# Patient Record
Sex: Male | Born: 1950
Health system: Southern US, Community
[De-identification: ages and names within clinical notes are randomized; demographics above are authoritative.]

## PROBLEM LIST (undated history)

## (undated) DIAGNOSIS — G4730 Sleep apnea, unspecified: Secondary | ICD-10-CM

## (undated) DIAGNOSIS — M545 Low back pain, unspecified: Secondary | ICD-10-CM

## (undated) DIAGNOSIS — I10 Essential (primary) hypertension: Secondary | ICD-10-CM

## (undated) DIAGNOSIS — E119 Type 2 diabetes mellitus without complications: Secondary | ICD-10-CM

## (undated) DIAGNOSIS — M810 Age-related osteoporosis without current pathological fracture: Secondary | ICD-10-CM

## (undated) DIAGNOSIS — M1990 Unspecified osteoarthritis, unspecified site: Secondary | ICD-10-CM

## (undated) DIAGNOSIS — I219 Acute myocardial infarction, unspecified: Secondary | ICD-10-CM

## (undated) DIAGNOSIS — I429 Cardiomyopathy, unspecified: Secondary | ICD-10-CM

## (undated) DIAGNOSIS — E7800 Pure hypercholesterolemia, unspecified: Secondary | ICD-10-CM

## (undated) HISTORY — DX: Low back pain: M54.5

## (undated) HISTORY — DX: Cardiomyopathy, unspecified: I42.9

## (undated) HISTORY — PX: BACK SURGERY: SHX140

## (undated) HISTORY — PX: APPENDECTOMY: SHX54

## (undated) HISTORY — DX: Pure hypercholesterolemia, unspecified: E78.00

## (undated) HISTORY — DX: Acute myocardial infarction, unspecified: I21.9

## (undated) HISTORY — PX: HAND SURGERY: SHX662

## (undated) HISTORY — DX: Sleep apnea, unspecified: G47.30

## (undated) HISTORY — DX: Low back pain, unspecified: M54.50

## (undated) HISTORY — DX: Age-related osteoporosis without current pathological fracture: M81.0

---

## 1998-02-19 ENCOUNTER — Emergency Department (HOSPITAL_COMMUNITY): Admission: EM | Admit: 1998-02-19 | Discharge: 1998-02-19 | Payer: Self-pay | Admitting: Emergency Medicine

## 1998-07-28 ENCOUNTER — Emergency Department (HOSPITAL_COMMUNITY): Admission: EM | Admit: 1998-07-28 | Discharge: 1998-07-28 | Payer: Self-pay

## 1999-06-27 ENCOUNTER — Emergency Department (HOSPITAL_COMMUNITY): Admission: EM | Admit: 1999-06-27 | Discharge: 1999-06-28 | Payer: Self-pay | Admitting: *Deleted

## 1999-06-28 ENCOUNTER — Emergency Department (HOSPITAL_COMMUNITY): Admission: EM | Admit: 1999-06-28 | Discharge: 1999-06-28 | Payer: Self-pay | Admitting: Emergency Medicine

## 1999-06-28 ENCOUNTER — Encounter: Payer: Self-pay | Admitting: Emergency Medicine

## 1999-08-02 ENCOUNTER — Inpatient Hospital Stay (HOSPITAL_COMMUNITY): Admission: EM | Admit: 1999-08-02 | Discharge: 1999-08-05 | Payer: Self-pay

## 2001-08-23 ENCOUNTER — Emergency Department (HOSPITAL_COMMUNITY): Admission: EM | Admit: 2001-08-23 | Discharge: 2001-08-23 | Payer: Self-pay | Admitting: Emergency Medicine

## 2001-08-29 ENCOUNTER — Ambulatory Visit (HOSPITAL_COMMUNITY): Admission: RE | Admit: 2001-08-29 | Discharge: 2001-08-29 | Payer: Self-pay | Admitting: Family Medicine

## 2001-08-29 ENCOUNTER — Encounter: Payer: Self-pay | Admitting: Family Medicine

## 2002-11-30 ENCOUNTER — Encounter: Payer: Self-pay | Admitting: Emergency Medicine

## 2002-11-30 ENCOUNTER — Emergency Department (HOSPITAL_COMMUNITY): Admission: EM | Admit: 2002-11-30 | Discharge: 2002-11-30 | Payer: Self-pay | Admitting: Emergency Medicine

## 2003-05-04 ENCOUNTER — Encounter: Admission: RE | Admit: 2003-05-04 | Discharge: 2003-05-04 | Payer: Self-pay | Admitting: Internal Medicine

## 2003-05-04 ENCOUNTER — Encounter: Payer: Self-pay | Admitting: Internal Medicine

## 2003-05-15 ENCOUNTER — Encounter: Admission: RE | Admit: 2003-05-15 | Discharge: 2003-05-24 | Payer: Self-pay | Admitting: Occupational Medicine

## 2003-10-02 ENCOUNTER — Inpatient Hospital Stay (HOSPITAL_COMMUNITY): Admission: RE | Admit: 2003-10-02 | Discharge: 2003-10-05 | Payer: Self-pay | Admitting: Orthopaedic Surgery

## 2003-10-02 ENCOUNTER — Encounter (INDEPENDENT_AMBULATORY_CARE_PROVIDER_SITE_OTHER): Payer: Self-pay | Admitting: Specialist

## 2004-02-28 ENCOUNTER — Encounter: Admission: RE | Admit: 2004-02-28 | Discharge: 2004-02-28 | Payer: Self-pay | Admitting: Orthopaedic Surgery

## 2004-07-04 ENCOUNTER — Emergency Department (HOSPITAL_COMMUNITY): Admission: EM | Admit: 2004-07-04 | Discharge: 2004-07-04 | Payer: Self-pay

## 2005-01-26 ENCOUNTER — Emergency Department (HOSPITAL_COMMUNITY): Admission: EM | Admit: 2005-01-26 | Discharge: 2005-01-26 | Payer: Self-pay | Admitting: Emergency Medicine

## 2005-12-03 ENCOUNTER — Emergency Department (HOSPITAL_COMMUNITY): Admission: EM | Admit: 2005-12-03 | Discharge: 2005-12-03 | Payer: Self-pay | Admitting: Emergency Medicine

## 2005-12-05 ENCOUNTER — Emergency Department (HOSPITAL_COMMUNITY): Admission: EM | Admit: 2005-12-05 | Discharge: 2005-12-05 | Payer: Self-pay | Admitting: Emergency Medicine

## 2006-12-03 ENCOUNTER — Emergency Department (HOSPITAL_COMMUNITY): Admission: EM | Admit: 2006-12-03 | Discharge: 2006-12-03 | Payer: Self-pay | Admitting: Emergency Medicine

## 2008-07-03 ENCOUNTER — Emergency Department (HOSPITAL_COMMUNITY): Admission: EM | Admit: 2008-07-03 | Discharge: 2008-07-03 | Payer: Self-pay | Admitting: Emergency Medicine

## 2008-08-17 ENCOUNTER — Emergency Department (HOSPITAL_COMMUNITY): Admission: EM | Admit: 2008-08-17 | Discharge: 2008-08-17 | Payer: Self-pay | Admitting: Emergency Medicine

## 2009-03-21 ENCOUNTER — Emergency Department (HOSPITAL_COMMUNITY): Admission: EM | Admit: 2009-03-21 | Discharge: 2009-03-22 | Payer: Self-pay | Admitting: Emergency Medicine

## 2009-08-30 ENCOUNTER — Emergency Department (HOSPITAL_COMMUNITY): Admission: EM | Admit: 2009-08-30 | Discharge: 2009-08-31 | Payer: Self-pay | Admitting: Emergency Medicine

## 2009-09-13 ENCOUNTER — Emergency Department (HOSPITAL_COMMUNITY): Admission: EM | Admit: 2009-09-13 | Discharge: 2009-09-13 | Payer: Self-pay | Admitting: Emergency Medicine

## 2010-06-21 ENCOUNTER — Emergency Department (HOSPITAL_COMMUNITY): Admission: EM | Admit: 2010-06-21 | Discharge: 2010-06-21 | Payer: Self-pay | Admitting: Emergency Medicine

## 2010-11-16 LAB — COMPREHENSIVE METABOLIC PANEL
ALT: 18 U/L (ref 0–53)
AST: 27 U/L (ref 0–37)
Albumin: 4 g/dL (ref 3.5–5.2)
Alkaline Phosphatase: 48 U/L (ref 39–117)
BUN: 10 mg/dL (ref 6–23)
CO2: 26 mEq/L (ref 19–32)
Calcium: 9.4 mg/dL (ref 8.4–10.5)
Chloride: 105 mEq/L (ref 96–112)
Creatinine, Ser: 1.21 mg/dL (ref 0.4–1.5)
GFR calc Af Amer: >60 mL/min (ref >60)
GFR calc non Af Amer: >60 mL/min (ref >60)
Glucose, Bld: 111 mg/dL — ABNORMAL HIGH (ref 70–99)
Potassium: 3.5 mEq/L (ref 3.5–5.1)
Sodium: 138 mEq/L (ref 135–145)
Total Bilirubin: 0.8 mg/dL (ref 0.3–1.2)
Total Protein: 7.4 g/dL (ref 6.0–8.3)

## 2010-11-16 LAB — URINALYSIS, ROUTINE W REFLEX MICROSCOPIC
Bilirubin Urine: NEGATIVE
Glucose, UA: NEGATIVE mg/dL
Hgb urine dipstick: NEGATIVE
Ketones, ur: NEGATIVE mg/dL
Nitrite: NEGATIVE
Protein, ur: NEGATIVE mg/dL
Specific Gravity, Urine: 1.014 (ref 1.005–1.030)
Urobilinogen, UA: 0.2 mg/dL (ref 0.0–1.0)
pH: 6 (ref 5.0–8.0)

## 2010-11-16 LAB — DIFFERENTIAL
Basophils Absolute: 0.1 10*3/uL (ref 0.0–0.1)
Basophils Relative: 1 % (ref 0–1)
Eosinophils Absolute: 0.1 10*3/uL (ref 0.0–0.7)
Eosinophils Relative: 2 % (ref 0–5)
Lymphocytes Relative: 30 % (ref 12–46)
Lymphs Abs: 1.5 10*3/uL (ref 0.7–4.0)
Monocytes Absolute: 0.5 10*3/uL (ref 0.1–1.0)
Monocytes Relative: 11 % (ref 3–12)
Neutro Abs: 2.8 10*3/uL (ref 1.7–7.7)
Neutrophils Relative %: 56 % (ref 43–77)

## 2010-11-16 LAB — POCT CARDIAC MARKERS
CKMB, poc: 1 ng/mL (ref 1.0–8.0)
CKMB, poc: 1.4 ng/mL (ref 1.0–8.0)
Myoglobin, poc: 103 ng/mL (ref 12–200)
Myoglobin, poc: 95.1 ng/mL (ref 12–200)
Troponin i, poc: <0.05 ng/mL (ref 0.00–0.09)
Troponin i, poc: <0.05 ng/mL (ref 0.00–0.09)

## 2010-11-16 LAB — CBC
HCT: 42 % (ref 39.0–52.0)
Hemoglobin: 13.8 g/dL (ref 13.0–17.0)
MCHC: 32.9 g/dL (ref 30.0–36.0)
MCV: 93.7 fL (ref 78.0–100.0)
Platelets: 180 10*3/uL (ref 150–400)
RBC: 4.48 MIL/uL (ref 4.22–5.81)
RDW: 13.4 % (ref 11.5–15.5)
WBC: 5 10*3/uL (ref 4.0–10.5)

## 2010-11-16 LAB — PROTIME-INR
INR: 1.1 (ref 0.00–1.49)
Prothrombin Time: 14.6 seconds (ref 11.6–15.2)

## 2010-11-16 LAB — APTT: aPTT: 38 seconds — ABNORMAL HIGH (ref 24–37)

## 2010-12-18 NOTE — H&P (Signed)
Raymond Welch, Raymond Welch                              ACCOUNT NO.:  [id number]   MEDICAL RECORD NO.:  [id number]                   PATIENT TYPE:  INP   LOCATION:  NA                                   FACILITY:  [facility]   PHYSICIAN:  Sharolyn Douglas, M.D.                     DATE OF BIRTH:  06-Sep-1950   DATE OF ADMISSION:  DATE OF DISCHARGE:                                HISTORY & PHYSICAL   CHIEF COMPLAINT:  Pain in my back and lower legs, sometimes left.   HISTORY OF PRESENT ILLNESS:  This is a 60 year old black male who was  injured at his place of employment in September of 2004.  He was doing his  regular duty and was walking along and stepped into a drain whole.  He had a  rather significant twisting type injury as his right leg went into the  drain.  Since that time, he has had increasing pain into his lumbar region  and the right lower extremity.  He also has numbness and tingling into the  thigh and calf as well.  It occasionally gives out on him.  Coughing and  sneezing increases discomfort.  He has extreme discomfort in the seated  position.  He has a course of physical therapy, as well as nonsteroidal anti-  inflammatories, which have not helped his discomfort.   We sent the patient for an MRI and it showed degenerative disk disease with  spinal stenosis at L4-5.  This was the most significant finding.  It  correlates with his pain distribution.   PAST MEDICAL HISTORY:  This patient has been in good health throughout his  lifetime.  He has had an appendectomy and ORIF to the left arm as a child.  He now has hypertension, being treated by the [facility] on [address] here in [place], [place].   CURRENT MEDICATIONS:  HCTZ.  He is unsure of the milligrams, one daily.   ALLERGIES:  He has no medical allergies.   SOCIAL HISTORY:  The patient smokes cigars.  He does not use ETOH.  He is  married.  He works as a Location manager.  His wife will be the primary  caregiver after surgery.   FAMILY HISTORY:  Noncontributory.   REVIEW OF SYSTEMS:  CENTRAL NERVOUS SYSTEM:  No seizure disorder, paralysis,  or double vision.  Numbness in the left lower extremity consistent with L4-5  nerve root irritation.  CARDIOVASCULAR:  No chest pain, no angina, and no  orthopnea.  RESPIRATORY:  No productive cough, no hemoptysis, and no  shortness of breath.  GASTROINTESTINAL:  No nausea, vomiting, melena, or  bloody stool.  No loss of control of either.  GENITOURINARY:  No discharge,  dysuria, or hematuria.  MUSCULOSKELETAL:  Primarily as in present illness.   PHYSICAL EXAMINATION:  GENERAL APPEARANCE:  An alert, cooperative, friendly,  60 year old, black male who is accompanied by his wife.  VITAL SIGNS:  Blood pressure 120/110 (did not take his blood pressure  medications this morning), pulse 80 and regular, respirations 12 and  unlabored.  HEENT:  Normocephalic.  PERRLA.  EOM intact.  The oropharynx is clear.  CHEST:  Clear to auscultation.  No rhonchi.  No rales.  HEART:  Regular rate and rhythm.  No murmurs are heard.  ABDOMEN:  Soft, slightly obese, and nontender.  Liver and spleen not felt.  GENITALIA:  Not done.  Not pertinent to present illness.  RECTAL:  Not done.  Not pertinent to present illness.  EXTREMITIES:  He has good strength in the lower extremities.  Knee jerks are  trace and equal.   ADMISSION DIAGNOSES:  1. L4-5 degenerative disk disease with spinal stenosis.  2. Hypertension.   PLAN:  The patient will undergo L4-5 laminectomy with posterior spinal  fusion with pedicle screws, TLIB, allograft, and BMP.      Dooley L. Cherlynn June.                 Sharolyn Douglas, M.D.    DLU/MEDQ  D:  09/19/2003  T:  09/19/2003  Job:  [id number]   cc:   [facility]  [address]  [place], [place]

## 2010-12-18 NOTE — Discharge Summary (Signed)
Raymond Welch, Raymond Welch                              ACCOUNT NO.:  [id number]   MEDICAL RECORD NO.:  [id number]                   PATIENT TYPE:  INP   LOCATION:  5017                                 FACILITY:  [facility]   PHYSICIAN:  Sharolyn Douglas, M.D.                     DATE OF BIRTH:  11-06-1950   DATE OF ADMISSION:  10/02/2003  DATE OF DISCHARGE:  10/05/2003                                 DISCHARGE SUMMARY   ADMISSION DIAGNOSES:  1. Degenerative disk disease L4-5.  2. Hypertension.   DISCHARGE DIAGNOSES:  1. Status post L4-5 spinal fusion, doing well.  2. Mild postoperative hemorrhagic anemia which was asymptomatic and did not     require blood transfusions.  3. Low grade temperature postoperative, resolved prior to discharge.  4. Urethral trauma secondary to Foley insertion, asymptomatic at discharge.  5. Urinary retention which resolved prior to discharge.  6. Hypertension.   PROCEDURE:  On October 02, 2003, the patient was taken to the operating room  for posterior spinal fusion at L4-5 with pedicle screws and TLIF.  This was  done by Sharolyn Douglas, M.D. and assistant [facility]   CONSULTATIONS:  Urology.   LABORATORY DATA:  Preoperative labs on February 25; complete blood count was  within normal limits.  PT/INR and PTT was within normal limits.  Complete  metabolic panel and CBC with differential were all within normal limits.  UA  was negative.  Blood type was type O, Rh positive and antibody screen  negative.  H&H was monitored three days postoperatively which hemoglobin  11.2, 33.4, on October 05, 2003.  The patient was asymptomatic and did not  require blood transfusions.  Basic metabolic panel on October 05, 2003, was  within normal limits except for glucose slightly elevated at 115.   HISTORY OF PRESENT ILLNESS:  The patient is a 60 year old male who was  injured at work in September of 2004.  He __________,  He walking along and  stepped into a drain hole and he had a rather  significant twisting injury to  his right ankle when he went into the drain.  Since that time, he has had  increasing pain in his lumbar region and right lower extremity.  Unfortunately, he has failed to respond to any conservative treatments  including pain medications, anti-inflammatory medicines, time, activity  modification, physical therapy, and he continues to have increasing pain in  his low back area and it is effecting his activities of daily living,  quality of life, as well as his ability to work.  Secondary to physical  examination findings as well as MRI findings, it was felt the best course of  management would be a posterior spinal fusion at L4-5.  Risks and benefits  of this procedure were discussed with the patient by Dr. Noel Gerold as well as  myself and he indicated  understanding and agreed to proceed.   HOSPITAL COURSE:  On October 02, 2003, the patient was taken to the operating  room for the above mentioned procedure.  Preoperatively, there was some  difficulty with the Foley insertion and therefore we did ask outside urology  to assist this with [place].  Urologist was able to successfully pass a Foley  catheter and noted trauma secondary to a difficult Foley insertion prior,  but did not notice any anatomic abnormalities.  After the Foley was fixed in  place, the procedure above was performed.  He tolerated it well without  intraoperative complications and he was transferred to the PACU in stable  condition.  Postoperatively, routine orthopedic spine protocol was followed  and he progressed well.  On October 03, 2003, the patient's Foley was  discontinued and he did have some difficulty urinating. This was felt to be  urinary retention, however, this did resolve and he was able to start  voiding on his own.  He progressed along with physical therapies well and  was safe, independent ambulating prior to being discharged to home.  On  October 05, 2003, the patient was voiding and did  notice some bleeding from his  urethra.  Nurses did notify the P.A. on call and since he was voiding well  and not having any significant pain, the bleeding was brief.  Opted to  continue monitoring in which this problem did resolve and he was able to  continue voiding without any difficulty throughout the remainder of his  hospital stay.  By October 05, 2003, the patient had met all orthopedic goals.  He was stable and ready for his discharge home.  Home health physical  therapy and occupational therapy has been arranged by our discharge planners  here at the hospital.   PLAN:  The patient is a 60 year old male status post posterior spinal fusion  doing well medically and orthopedically stable and ready for discharge.   ACTIVITY:  Continue daily ambulation.  He should have his brace on when he  is up. Back precautions were explained and understood.  He should not lift  anything heavier than 5 pounds.  He should change his dressing daily.  He  should keep the incision dry for approximately five days at which time he  may begin showering.  He is to follow up with Dr. Noel Gerold two weeks  postoperatively.  He was instructed to call for an appointment for this.   DISCHARGE MEDICATIONS:  1. Percocet and Robaxin as needed for pain and muscle spasm.  2. Multivitamins daily.  3. Calcium daily.  4. Laxative as needed for constipation.   He should avoid any anti-inflammatories for three months postoperatively.   DIET:  Regular diet as tolerated.   CONDITION ON DISCHARGE:  Stable and improved.   The patient is being discharged to his home with home health physical  therapy and occupational therapy per Genevieve Norlander as well as his families'  assistance.      Verlin Fester, P.A.                       Sharolyn Douglas, M.D.    CM/MEDQ  D:  11/20/2003  T:  11/22/2003  Job:  [id number]

## 2010-12-18 NOTE — Op Note (Signed)
NAMEANGELO, Raymond Welch                              ACCOUNT NO.:  [id number]   MEDICAL RECORD NO.:  [id number]                   PATIENT TYPE:  INP   LOCATION:  2550                                 FACILITY:  [facility]   PHYSICIAN:  Sharolyn Douglas, M.D.                     DATE OF BIRTH:  [birth year]   DATE OF PROCEDURE:  10/02/2003  DATE OF DISCHARGE:                                 OPERATIVE REPORT   PREOPERATIVE DIAGNOSIS:  1. L4-5 spondylosis and spinal stenosis.  2. Skin lesion, lower back.   POSTOPERATIVE DIAGNOSIS:  1. L4-5 spondylosis and spinal stenosis.  2. Skin lesion, lower back.   OPERATION PERFORMED:  1. L4-5 lumbar laminectomy with wide decompression of the common thecal sac     and L5 nerve roots bilaterally.  2. Transforaminal lumbar interbody fusion L4-5 with placement of a 14 mm     PEAK prosthetic cage packed with local autogenous bone graft and BMP.  3. Posterior spinal arthrodesis, L4-5.  4. Pedicle screw instrumentation, L4-5 utilizing [employer] system.  5. Neuro monitoring using free running and triggered EMGs.  6. Excision of skin tag through separate incision.   SURGEON:  Sharolyn Douglas, M.D.   ASSISTANT:  Verlin Fester, P.A.   ANESTHESIA:  General endotracheal.   COMPLICATIONS:  None.   INDICATIONS FOR PROCEDURE:  The patient is a 60 year old male with  progressively worsening back, bilateral lower extremity pain left greater  than right.  Plain radiographs and MRI scan show degenerative changes and  facet arthropathy worse at L4-5.  Central disk bulge with moderately severe  spinal stenosis and lateral recess narrowing.  He has failed all  conservative treatment options.  He has elected to undergo L4-5  decompression and fusion in hopes of improving his symptoms.  Risks,  benefits and alternatives were extensively reviewed with the patient.  In  addition, he discussed with me removing a skin tag from the right side of  his lower back.   DESCRIPTION OF  PROCEDURE:  The patient was properly identified in the  holding area and taken to the operating room.  He underwent general  endotracheal anesthesia without difficulty.  He was given prophylactic  intravenous antibiotics.  A Foley catheter was placed by the nursing staff.  A moderate amount of blood was noted and urology was consulted.  The  urologist then performed a cystoscopy, successfully placing a Foley  catheter.  He was given Cipro 400 mg IV prophylactically. He was then turned  prone onto the [place] frame.  All bony prominences were padded.  Face and  eyes were protected at all times.  Back prepped and draped in the usual  sterile fashion.  A 10 cm incision was made over the L4-5 interspace.  Dissection was carried sharply through the deep fascia.  Subperiosteal  elevation of the paraspinal muscles was carried out over  the L4-5 facet  joint.  Deep retractors were placed.  The facet joints were noted to be  hypertrophied.  A laminectomy was carried out removing the inferior 2/3 of  the L4 spinous process and lamina.  We decompressed the spinal canal  proximal to the L4-5 facet joint.  We then carried out a wide lateral recess  decompression.  The thecal sac was decompressed out laterally flush with the  L5 pedicles.  We confirmed that the nerve roots were completely free from  their take off out the foramen.  We then turned our attention to performing  a transforaminal lumbar interbody fusion on the left side at L4-5.  Sharp  annulotomy was carried out.  We identified the exiting L4 and traversing L5  nerve roots.  Free running EMGs were monitored and there were no deleterious  changes.  Facetectomy carried out by cutting the superior facet of L5 flush  with the pedicle and removing the inferior facet of L4 by transecting the  pars interarticularis.  This created the transforaminal window.  Specialized  instruments were used to scrape the cartilaginous end plates and perform a   radical diskectomy across to the contralateral side.  We dilated the space  up to 14 mm.  This space was irrigated.  We then tightly packed the disk  space with local autogenous bone graft collected from the laminectomy which  was cleaned of all soft tissue and morselized.  We then pushed a BMP sponge  from a small InFuse kit anteriorly and across the midline.  A 14 mm Nuvasive  PEAK prosthetic cage was then carefully tamped into position anteriorly and  across the midline.  Fluoroscopy was used to confirm good positioning.  We  then turned our attention to placing pedicle screws at L4 and 5 bilaterally.  Using anatomic probing technique, we were able to probe the pedicles  medially from within the spinal canal as well as within the pedicle hole.  There were no breaches.  We then placed 45 x 6.5 mm screws at L4 and 5  bilaterally.  We had excellent purchase.  Each screw was then stimulated  using triggered EMGs.  We had a response at greater than 14 milliamps  consistent with interosseous placement of the screws.  We then turned our  attention to performing a posterior spinal arthrodesis at L4-5.  The facet  joint was decorticated on the right side.  We packed the remaining bone  graft around the facet joint.  We then irrigated the wound.  Bleeding was  controlled with bipolar electrocautery and Gelfoam.  Deep Hemovac drain was  left in place.  Deep fascia closed with a running #1 Vicryl.  Subcutaneous  layer closed with 0 Vicryl and 2-0 Vicryl followed by a running 3-0  subcuticular suture on the skin.  Benzoin and Steri-Strips were placed.  We  then turned our attention to excising the skin tag in the L5-S1 level off on  the right side of the patient's back.  The lesion measured 1 x 1 cm.  A 2 cm  ellipse was created using a 15 blade full thickness down to subcutaneous  fat.  We had several millimeters of margin medial and lateral to the lesion. The specimen was sent to pathology.  We then  closed the wound in layers  using 2-0 Vicryl in the subcutaneous layer followed by a running 3-0 Vicryl  subcuticular suture to approximate the skin.  Benzoin and Steri-Strips were  placed.  Sterile dressing was applied.  The patient was turned supine,  extubated without difficulty and transferred to the recovery room in stable  condition able to move his upper and lower extremities.                                               Sharolyn Douglas, M.D.    MC/MEDQ  D:  10/02/2003  T:  10/02/2003  Job:  [id number]

## 2011-02-22 ENCOUNTER — Inpatient Hospital Stay (INDEPENDENT_AMBULATORY_CARE_PROVIDER_SITE_OTHER)
Admission: RE | Admit: 2011-02-22 | Discharge: 2011-02-22 | Disposition: A | Discharge status: Home / Self Care | Payer: Medicare Other | Source: Ambulatory Visit | Attending: Family Medicine | Admitting: Family Medicine

## 2011-02-22 DIAGNOSIS — H109 Unspecified conjunctivitis: Secondary | ICD-10-CM

## 2011-05-06 LAB — URINALYSIS, ROUTINE W REFLEX MICROSCOPIC
Bilirubin Urine: NEGATIVE
Glucose, UA: NEGATIVE mg/dL
Hgb urine dipstick: NEGATIVE
Nitrite: NEGATIVE
Protein, ur: NEGATIVE mg/dL
Specific Gravity, Urine: 1.029 (ref 1.005–1.030)
Urobilinogen, UA: 1 mg/dL (ref 0.0–1.0)
pH: 6 (ref 5.0–8.0)

## 2012-08-17 ENCOUNTER — Other Ambulatory Visit: Payer: Self-pay | Admitting: Gastroenterology

## 2012-08-17 LAB — HM COLONOSCOPY

## 2013-11-27 DIAGNOSIS — M961 Postlaminectomy syndrome, not elsewhere classified: Secondary | ICD-10-CM | POA: Insufficient documentation

## 2014-04-11 ENCOUNTER — Other Ambulatory Visit: Payer: Self-pay | Admitting: Neurosurgery

## 2014-04-11 DIAGNOSIS — M545 Low back pain, unspecified: Secondary | ICD-10-CM

## 2014-04-20 ENCOUNTER — Ambulatory Visit
Admission: RE | Admit: 2014-04-20 | Discharge: 2014-04-20 | Disposition: A | Discharge status: Home / Self Care | Payer: Commercial Managed Care - HMO | Source: Ambulatory Visit | Attending: Neurosurgery | Admitting: Neurosurgery

## 2014-04-20 DIAGNOSIS — M545 Low back pain, unspecified: Secondary | ICD-10-CM

## 2014-04-30 DIAGNOSIS — R29818 Other symptoms and signs involving the nervous system: Secondary | ICD-10-CM | POA: Insufficient documentation

## 2014-04-30 DIAGNOSIS — M51369 Other intervertebral disc degeneration, lumbar region without mention of lumbar back pain or lower extremity pain: Secondary | ICD-10-CM | POA: Insufficient documentation

## 2014-06-24 ENCOUNTER — Emergency Department (HOSPITAL_COMMUNITY): Payer: No Typology Code available for payment source

## 2014-06-24 ENCOUNTER — Emergency Department (HOSPITAL_COMMUNITY)
Admission: EM | Admit: 2014-06-24 | Discharge: 2014-06-24 | Disposition: A | Discharge status: Home / Self Care | Payer: No Typology Code available for payment source | Attending: Emergency Medicine | Admitting: Emergency Medicine

## 2014-06-24 ENCOUNTER — Encounter (HOSPITAL_COMMUNITY): Payer: Self-pay | Admitting: Family Medicine

## 2014-06-24 DIAGNOSIS — S39012A Strain of muscle, fascia and tendon of lower back, initial encounter: Secondary | ICD-10-CM | POA: Insufficient documentation

## 2014-06-24 DIAGNOSIS — M545 Low back pain, unspecified: Secondary | ICD-10-CM

## 2014-06-24 DIAGNOSIS — I10 Essential (primary) hypertension: Secondary | ICD-10-CM | POA: Insufficient documentation

## 2014-06-24 DIAGNOSIS — Z79899 Other long term (current) drug therapy: Secondary | ICD-10-CM | POA: Diagnosis not present

## 2014-06-24 DIAGNOSIS — Y92410 Unspecified street and highway as the place of occurrence of the external cause: Secondary | ICD-10-CM | POA: Diagnosis not present

## 2014-06-24 DIAGNOSIS — Y998 Other external cause status: Secondary | ICD-10-CM | POA: Insufficient documentation

## 2014-06-24 DIAGNOSIS — S299XXA Unspecified injury of thorax, initial encounter: Secondary | ICD-10-CM | POA: Insufficient documentation

## 2014-06-24 DIAGNOSIS — Y9389 Activity, other specified: Secondary | ICD-10-CM | POA: Diagnosis not present

## 2014-06-24 DIAGNOSIS — E119 Type 2 diabetes mellitus without complications: Secondary | ICD-10-CM | POA: Diagnosis not present

## 2014-06-24 DIAGNOSIS — S3992XA Unspecified injury of lower back, initial encounter: Secondary | ICD-10-CM | POA: Diagnosis present

## 2014-06-24 HISTORY — DX: Essential (primary) hypertension: I10

## 2014-06-24 HISTORY — DX: Type 2 diabetes mellitus without complications: E11.9

## 2014-06-24 MED ORDER — DIAZEPAM 5 MG PO TABS: 5.0000 mg | ORAL_TABLET | Freq: Four times a day (QID) | ORAL | Status: DC | PRN

## 2014-06-24 MED ORDER — IBUPROFEN 400 MG PO TABS
800.0000 mg | ORAL_TABLET | Freq: Once | ORAL | Status: AC
  Administered 2014-06-24: 800 mg via ORAL
  Filled 2014-06-24: qty 2

## 2014-06-24 NOTE — ED Notes (Signed)
Patient returned from xray.

## 2014-06-24 NOTE — ED Notes (Signed)
Called x-ray about wait time.
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
## 2014-06-24 NOTE — ED Notes (Signed)
Discussed with the patient that x-ray of spine has been ordered.

## 2014-06-24 NOTE — Discharge Instructions (Signed)
1. Medications: valium, usual home medications 2. Treatment: rest, drink plenty of fluids, gentle stretching as discussed, alternate ice and heat 3. Follow Up: Please followup with your primary doctor in 3 days for discussion of your diagnoses and further evaluation after today's visit; if you do not have a primary care doctor use the resource guide provided to find one;  Return to the ER for worsening back pain, difficulty walking, loss of bowel or bladder control or other concerning symptoms   Back Exercises Back exercises help treat and prevent back injuries. The goal of back exercises is to increase the strength of your abdominal and back muscles and the flexibility of your back. These exercises should be started when you no longer have back pain. Back exercises include:  Pelvic Tilt. Lie on your back with your knees bent. Tilt your pelvis until the lower part of your back is against the floor. Hold this position 5 to 10 sec and repeat 5 to 10 times.  Knee to Chest. Pull first 1 knee up against your chest and hold for 20 to 30 seconds, repeat this with the other knee, and then both knees. This may be done with the other leg straight or bent, whichever feels better.  Sit-Ups or Curl-Ups. Bend your knees 90 degrees. Start with tilting your pelvis, and do a partial, slow sit-up, lifting your trunk only 30 to 45 degrees off the floor. Take at least 2 to 3 seconds for each sit-up. Do not do sit-ups with your knees out straight. If partial sit-ups are difficult, simply do the above but with only tightening your abdominal muscles and holding it as directed.  Hip-Lift. Lie on your back with your knees flexed 90 degrees. Push down with your feet and shoulders as you raise your hips a couple inches off the floor; hold for 10 seconds, repeat 5 to 10 times.  Back arches. Lie on your stomach, propping yourself up on bent elbows. Slowly press on your hands, causing an arch in your low back. Repeat 3 to 5  times. Any initial stiffness and discomfort should lessen with repetition over time.  Shoulder-Lifts. Lie face down with arms beside your body. Keep hips and torso pressed to floor as you slowly lift your head and shoulders off the floor. Do not overdo your exercises, especially in the beginning. Exercises may cause you some mild back discomfort which lasts for a few minutes; however, if the pain is more severe, or lasts for more than 15 minutes, do not continue exercises until you see your caregiver. Improvement with exercise therapy for back problems is slow.  See your caregivers for assistance with developing a proper back exercise program. Document Released: 08/26/2004 Document Revised: 10/11/2011 Document Reviewed: 05/20/2011 [facility] Patient Information 2015 [employer]. This information is not intended to replace advice given to you by your health care provider. Make sure you discuss any questions you have with your health care provider.    Emergency Department Resource Guide 1) Find a Doctor and Pay Out of Pocket Although you won't have to find out who is covered by your insurance plan, it is a good idea to ask around and get recommendations. You will then need to call the office and see if the doctor you have chosen will accept you as a new patient and what types of options they offer for patients who are self-pay. Some doctors offer discounts or will set up payment plans for their patients who do not have insurance, but you  will need to ask so you aren't surprised when you get to your appointment.  2) Contact Your Local Health Department Not all health departments have doctors that can see patients for sick visits, but many do, so it is worth a call to see if yours does. If you don't know where your local health department is, you can check in your phone book. The CDC also has a tool to help you locate your state's health department, and many state websites also have listings of all of  their local health departments.  3) Find a [facility] If your illness is not likely to be very severe or complicated, you may want to try a walk in clinic. These are popping up all over the country in pharmacies, drugstores, and shopping centers. They're usually staffed by nurse practitioners or physician assistants that have been trained to treat common illnesses and complaints. They're usually fairly quick and inexpensive. However, if you have serious medical issues or chronic medical problems, these are probably not your best option.  No Primary Care Doctor: - Call Health Connect at  [phone] - they can help you locate a primary care doctor that  accepts your insurance, provides certain services, etc. - Physician Referral Service- [phone]  Chronic Pain Problems: Organization         Address  Phone   Notes  [facility]  [phone] Patients need to be referred by their primary care doctor.   Medication Assistance: Organization         Address  Phone   Notes  [facility] Medication [facility] [address] [place], [place] [address] [phone] --Must be a resident of [facility] -- Must have NO insurance coverage whatsoever (no Medicaid/ Medicare, etc.) -- The pt. MUST have a primary care doctor that directs their care regularly and follows them in the community   MedAssist  [phone]   [employer]  [phone]    Agencies that provide inexpensive medical care: Organization         Address  Phone   Notes  [place]  [phone]   [facility]    [phone]   [facility] [place], [place] [address] [phone]   [place] [address], [place] [phone]   Planned Parenthood    [phone]   [facility]    [phone]   [place] and [place] [address],  [place] Phone:  [phone], Fax:  [phone] Hours of Operation:  9 am - 6 pm, M-F.  Also accepts Medicaid/Medicare and self-pay.  [facility] for [place], [address], [place] Phone: [phone], Fax: [phone]. Hours of Operation:  8:30 am - 5:30 pm, M-F.  Also accepts Medicaid and self-pay.  [facility] [place] [address] [place] Phone: [phone]   [place], [place], [place] [phone], Ext. [phone] Mondays & Thursdays: 7-9 AM.  First 15 patients are seen on a first come, first serve basis.    [place] Providers:  Organization         Address  Phone   Notes  [facility] [address], [place] [phone] Also accepts self-pay patients.  [place], [place] [address],  [place]  [phone]   [place], [address], [place] [phone]   [place] [address], [place] [phone]   Lucianne Lei [address], [place]   [phone] Only accepts [place] Access [place] patients after they have their name applied to their card.   Self-Pay (no insurance) in [facility]:  Organization         Address  Phone   Notes  Sickle Cell Patients, [facility] [place] [phone]   [facility] [place] [phone]   [facility] [place]  [place], [place],  [phone]   [facility]/Dr. Osei-Bonsu  [address] [place] or [address], [place] [phone] Phone number for both [place] and [place] locations is the same.  [facility] [address] [place] [phone]   [facility] [address] [place] or [address] [phone]   [facility] [address] [place] [phone], phone; [phone], fax Sees patients 1st and 3rd Saturday of every month.  Must not qualify for public or private insurance (i.e. Medicaid, Medicare, [place] Health Choice, Veterans' Benefits)  Household income should be no more than 200% of the poverty level The clinic cannot treat you if you are pregnant or think you are pregnant  Sexually transmitted diseases are not treated at the clinic.    Dental Care: Organization         Address  Phone  Notes  [facility] Department of [facility] [place] [phone] Accepts children up to age 34 who are enrolled in [place] or [place]; pregnant women with a Medicaid card; and children who have applied for Medicaid or [place] Health Choice, but were declined, whose parents can pay a reduced fee at time of service.  [facility] Department of [facility]  [address], [place] [phone] Accepts children up to age 78 who are enrolled in [place] or [place]; pregnant women with a Medicaid card; and children who have applied for Medicaid or [place] Health Choice, but were declined, whose parents can pay a reduced fee at time of service.  [place] Adult Dental Access PROGRAM  [place] [phone] Patients are seen by appointment only. Walk-ins are not accepted. [place] will see patients 33 years of age and older. Monday - Tuesday (8am-5pm) Most Wednesdays (8:30-5pm) $30 per visit, cash only  [facility] Adult Dental Access PROGRAM  [address], [facility] [phone] Patients are seen by appointment only. Walk-ins are not accepted. [place] will see patients 54 years of age and older. One Wednesday Evening (Monthly: Volunteer Based).  $30 per visit, cash only  [place]   [phone] for adults; Children under age 60, call Graduate Pediatric Dentistry at [phone]. Children aged 18-14, please call [phone] to request a pediatric application.  Dental services are provided in all areas of dental care including fillings, crowns and bridges, complete and partial dentures, implants, gum treatment, root canals, and extractions. Preventive care is also provided. Treatment is provided to both adults and children. Patients are selected via a lottery and  there is often a waiting list.   [facility] [address] [place]  [phone] [email or web address]   [facility] [address] [place], [place] [phone], Ext. [phone] Second and Fourth Thursday of each month, opens at 6:30 AM; Clinic ends at 9 AM.  Patients are seen on a first-come first-served basis, and a limited number are seen during each clinic.   [facility]  [address] Hillard Danker [place], [place] [phone]   Eligibility Requirements You must have lived in [place], [place], or [place] counties for at least the last three months.   You cannot be eligible for state or federal sponsored [employer], including [employer], [place], or [employer].   You generally cannot be eligible for healthcare insurance through your employer.    How to apply: Eligibility screenings are held every Tuesday and Wednesday afternoon from 1:00 pm until 4:00 pm. You do not need an appointment for the interview!  [facility] [address], [place], [place]   [place]  [place] Department  [place]  [phone]    Behavioral Health Resources in the Community: Intensive Outpatient Programs Organization         Address  Phone  Notes  [place]. [address], [place], [place] [phone]   [facility] [address], [place], [place]   ADS: Alcohol & Drug Svcs [address] [place], [place]   [place] [address],  [place], [place] or [phone]   Substance Abuse Resources Organization         Address  Phone  Notes  Alcohol and Drug Services  [phone]   [place]  [phone]   [facility] [place]   Chinita Pester  [phone]   Residential & Outpatient Substance Abuse Program  [phone]   Psychological Services Organization         Address  Phone  Notes  [facility] [place]  [place]  [phone]   [place] [address], [place] or [phone]    Mobile Crisis Teams Organization         Address  Phone  Notes  [facility], Mobile Crisis Care Unit  [phone]   [facility]  [address] [place], [place]   [place] Levels [address] [place] [phone]    Self-Help/Support Groups Organization         Address  Phone             Notes  [place]. of [place] - variety of support groups  [place] Call for more information  Narcotics Anonymous (NA), [facility] [address], [employer] [place]  2 meetings at this location   Special educational needs teacher         Address  Phone  Notes  [facility]idential Treatment [place],    [place]  [phone]   [facility]  [address] [place] [phone], [place], [place]   [place], [place] [phone] Admissions: 8am-3pm M-F  Incentives Substance [place] [address]    [place], [place]   [facility] [address] [place], [place], [place]  [facility] [address]  [place], [place] [phone]     [facility] - Intensive Outpatient [address], [place], [place] [phone]   [facility] ([facility] [address]  [place], [place] [phone] or [phone]   [facility] (RTS) [address] [place], [place] [phone] Accepts Medicaid  [facility] [place] [address]  [place] [phone] Substance Abuse/Addiction Treatment   [facility] Behavioral Health Resources Organization         Address  Phone  Notes  [facility]  [phone]) [phone]   Angie FavaJulie Brannon, PhD [address], [address] [place], [place]   [phone](336) [phone] or [phone]) [phone]   [facility]   [address] [place], [place] [phone]) [phone]   [facility] [address], [place], [place] [phone] Insurance/Medicaid/sponsorship through [facility]  Faith and Families [address]                                    [place], [place] [phone] Therapy/tele-psych/case  [facility] [address].   [place], [place] [phone]) [phone]    Dr. Lolly MustacheArfeen  [phone]) [phone]   [facility]  [employer] [facility] 1) [address], [place] 2) [address], [place] 3)  [address], [place] [phone](336) [phone](336) [phone]  [phone](336) [phone]   [facility] Child Abuse Hotline [phone](336) [phone] or [phone](336) [phone] (After Hours)
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
## 2015-08-05 DIAGNOSIS — M4806 Spinal stenosis, lumbar region: Secondary | ICD-10-CM | POA: Diagnosis not present

## 2015-08-05 DIAGNOSIS — M5136 Other intervertebral disc degeneration, lumbar region: Secondary | ICD-10-CM | POA: Diagnosis not present

## 2015-08-05 DIAGNOSIS — M961 Postlaminectomy syndrome, not elsewhere classified: Secondary | ICD-10-CM | POA: Diagnosis not present

## 2015-08-05 DIAGNOSIS — M545 Low back pain: Secondary | ICD-10-CM | POA: Diagnosis not present

## 2015-09-15 DIAGNOSIS — N08 Glomerular disorders in diseases classified elsewhere: Secondary | ICD-10-CM | POA: Diagnosis not present

## 2015-09-15 DIAGNOSIS — N182 Chronic kidney disease, stage 2 (mild): Secondary | ICD-10-CM | POA: Diagnosis not present

## 2015-09-15 DIAGNOSIS — I129 Hypertensive chronic kidney disease with stage 1 through stage 4 chronic kidney disease, or unspecified chronic kidney disease: Secondary | ICD-10-CM | POA: Diagnosis not present

## 2015-09-15 DIAGNOSIS — E1122 Type 2 diabetes mellitus with diabetic chronic kidney disease: Secondary | ICD-10-CM | POA: Diagnosis not present

## 2015-10-28 DIAGNOSIS — E119 Type 2 diabetes mellitus without complications: Secondary | ICD-10-CM | POA: Diagnosis not present

## 2015-10-28 DIAGNOSIS — H2511 Age-related nuclear cataract, right eye: Secondary | ICD-10-CM | POA: Diagnosis not present

## 2015-10-28 DIAGNOSIS — H25812 Combined forms of age-related cataract, left eye: Secondary | ICD-10-CM | POA: Diagnosis not present

## 2015-10-28 DIAGNOSIS — H16223 Keratoconjunctivitis sicca, not specified as Sjogren's, bilateral: Secondary | ICD-10-CM | POA: Diagnosis not present

## 2015-10-29 DIAGNOSIS — M545 Low back pain: Secondary | ICD-10-CM | POA: Diagnosis not present

## 2015-10-29 DIAGNOSIS — M4806 Spinal stenosis, lumbar region: Secondary | ICD-10-CM | POA: Diagnosis not present

## 2015-10-29 DIAGNOSIS — M961 Postlaminectomy syndrome, not elsewhere classified: Secondary | ICD-10-CM | POA: Diagnosis not present

## 2015-10-29 DIAGNOSIS — M5136 Other intervertebral disc degeneration, lumbar region: Secondary | ICD-10-CM | POA: Diagnosis not present

## 2016-01-15 DIAGNOSIS — I129 Hypertensive chronic kidney disease with stage 1 through stage 4 chronic kidney disease, or unspecified chronic kidney disease: Secondary | ICD-10-CM | POA: Diagnosis not present

## 2016-01-15 DIAGNOSIS — E1122 Type 2 diabetes mellitus with diabetic chronic kidney disease: Secondary | ICD-10-CM | POA: Diagnosis not present

## 2016-01-15 DIAGNOSIS — N08 Glomerular disorders in diseases classified elsewhere: Secondary | ICD-10-CM | POA: Diagnosis not present

## 2016-01-15 DIAGNOSIS — N182 Chronic kidney disease, stage 2 (mild): Secondary | ICD-10-CM | POA: Diagnosis not present

## 2016-01-26 DIAGNOSIS — M5136 Other intervertebral disc degeneration, lumbar region: Secondary | ICD-10-CM | POA: Diagnosis not present

## 2016-01-26 DIAGNOSIS — M545 Low back pain: Secondary | ICD-10-CM | POA: Diagnosis not present

## 2016-01-26 DIAGNOSIS — M4806 Spinal stenosis, lumbar region: Secondary | ICD-10-CM | POA: Diagnosis not present

## 2016-01-26 DIAGNOSIS — M961 Postlaminectomy syndrome, not elsewhere classified: Secondary | ICD-10-CM | POA: Diagnosis not present

## 2016-01-28 DIAGNOSIS — H16223 Keratoconjunctivitis sicca, not specified as Sjogren's, bilateral: Secondary | ICD-10-CM | POA: Diagnosis not present

## 2016-01-28 DIAGNOSIS — H10413 Chronic giant papillary conjunctivitis, bilateral: Secondary | ICD-10-CM | POA: Diagnosis not present

## 2016-01-28 DIAGNOSIS — H25812 Combined forms of age-related cataract, left eye: Secondary | ICD-10-CM | POA: Diagnosis not present

## 2016-01-28 DIAGNOSIS — E119 Type 2 diabetes mellitus without complications: Secondary | ICD-10-CM | POA: Diagnosis not present

## 2016-01-28 DIAGNOSIS — H2511 Age-related nuclear cataract, right eye: Secondary | ICD-10-CM | POA: Diagnosis not present

## 2016-02-11 DIAGNOSIS — H25812 Combined forms of age-related cataract, left eye: Secondary | ICD-10-CM | POA: Diagnosis not present

## 2016-02-11 DIAGNOSIS — E119 Type 2 diabetes mellitus without complications: Secondary | ICD-10-CM | POA: Diagnosis not present

## 2016-02-11 DIAGNOSIS — H2511 Age-related nuclear cataract, right eye: Secondary | ICD-10-CM | POA: Diagnosis not present

## 2016-02-11 DIAGNOSIS — H10413 Chronic giant papillary conjunctivitis, bilateral: Secondary | ICD-10-CM | POA: Diagnosis not present

## 2016-02-11 DIAGNOSIS — H16223 Keratoconjunctivitis sicca, not specified as Sjogren's, bilateral: Secondary | ICD-10-CM | POA: Diagnosis not present

## 2016-02-17 ENCOUNTER — Telehealth: Payer: Self-pay | Admitting: *Deleted

## 2016-02-17 ENCOUNTER — Encounter (HOSPITAL_COMMUNITY): Payer: Self-pay | Admitting: Emergency Medicine

## 2016-02-17 ENCOUNTER — Emergency Department (HOSPITAL_COMMUNITY)
Admission: EM | Admit: 2016-02-17 | Discharge: 2016-02-17 | Disposition: A | Discharge status: Home / Self Care | Payer: PPO | Attending: Emergency Medicine | Admitting: Emergency Medicine

## 2016-02-17 ENCOUNTER — Emergency Department (HOSPITAL_COMMUNITY): Payer: PPO

## 2016-02-17 DIAGNOSIS — I10 Essential (primary) hypertension: Secondary | ICD-10-CM | POA: Insufficient documentation

## 2016-02-17 DIAGNOSIS — M7989 Other specified soft tissue disorders: Secondary | ICD-10-CM | POA: Diagnosis not present

## 2016-02-17 DIAGNOSIS — M79674 Pain in right toe(s): Secondary | ICD-10-CM | POA: Diagnosis not present

## 2016-02-17 DIAGNOSIS — E119 Type 2 diabetes mellitus without complications: Secondary | ICD-10-CM | POA: Insufficient documentation

## 2016-02-17 DIAGNOSIS — M109 Gout, unspecified: Secondary | ICD-10-CM

## 2016-02-17 MED ORDER — INDOMETHACIN 50 MG PO CAPS: 50.0000 mg | ORAL_CAPSULE | Freq: Three times a day (TID) | ORAL | Status: DC

## 2016-02-17 MED ORDER — INDOMETHACIN 25 MG PO CAPS
50.0000 mg | ORAL_CAPSULE | Freq: Once | ORAL | Status: AC
  Administered 2016-02-17: 50 mg via ORAL
  Filled 2016-02-17: qty 2

## 2016-02-17 NOTE — ED Provider Notes (Signed)
CSN: [id number]     Arrival date & time 02/17/16  0230 History   First MD Initiated Contact with Patient 02/17/16 [phone]     Chief Complaint  Patient presents with  . Toe Pain     (Consider location/radiation/quality/duration/timing/severity/associated sxs/prior Treatment) HPI Comments: Patient with a history of HTN presents with painful, swollen right first MT joint for the past 3 days. No ascending redness, fever, chills, ankle or leg pain. No known injury. No history of gout.   The history is provided by the patient. No language interpreter was used.    Past Medical History  Diagnosis Date  . Hypertension   . Diabetes mellitus without complication [facility](HCC)    Past Surgical History  Procedure Laterality Date  . Back surgery     No family history on file. Social History  Substance Use Topics  . Smoking status: Never Smoker   . Smokeless tobacco: None  . Alcohol Use: No    Review of Systems  Constitutional: Negative for fever and chills.  Musculoskeletal:       See HPI  Skin: Positive for color change.  Neurological: Negative.  Negative for numbness.      Allergies  Review of patient's allergies indicates no known allergies.  Home Medications   Prior to Admission medications   Medication Sig Start Date End Date Taking? Authorizing Provider  losartan-hydrochlorothiazide (HYZAAR) 100-12.5 MG tablet Take 1 tablet by mouth daily. 12/12/15  Yes Historical Provider, MD  pravastatin (PRAVACHOL) 40 MG tablet Take 40 mg by mouth daily. 01/08/16  Yes Historical Provider, MD  diazepam (VALIUM) 5 MG tablet Take 1 tablet (5 mg total) by mouth every 6 (six) hours as needed for anxiety (spasms). Patient not taking: Reported on 02/17/2016 06/24/14   Dahlia ClientHannah Muthersbaugh, PA-C   BP 120/87 mmHg  Pulse 81  Temp(Src) 98.9 F (37.2 C) (Oral)  Resp 19  Ht 6\' 2"  (1.88 m)  Wt 135.626 kg  BMI 38.37 kg/m2  SpO2 95% Physical Exam  Constitutional: He is oriented to person, place, and time.  He appears well-developed and well-nourished.  Neck: Normal range of motion.  Pulmonary/Chest: Effort normal.  Musculoskeletal: Normal range of motion.  Right foot swollen and red at the 1st MTP joint. Significantly tender. No warmth or redness extending away from the joint. Nails intact and healthy. No interphalangeal lesions or drainage.   Neurological: He is alert and oriented to person, place, and time.  Skin: Skin is warm and dry.  Psychiatric: He has a normal mood and affect.    ED Course  Procedures (including critical care time) Labs Review Labs Reviewed - No data to display  Imaging Review Dg Foot Complete Right  02/17/2016  CLINICAL DATA:  Increased pain and swelling at bunion. EXAM: RIGHT FOOT COMPLETE - 3+ VIEW COMPARISON:  None. FINDINGS: No fracture, subluxation, or evidence of osseous infection. Negative for hallux valgus or bony bunion. Benign circumscribed cystic change in the great toe proximal phalanx along the MTP joint. IMPRESSION: No acute finding. Electronically Signed   By: Marnee SpringJonathon  Watts M.D.   On: 02/17/2016 05:26   I have personally reviewed and evaluated these images and lab results as part of my medical decision-making.   EKG Interpretation None      MDM   Final diagnoses:  None    1. Gout, right foot  Patient presents with painful swelling to right 1st MTP joint. No evidence of infection - no fever, sore/lesion. Findings c/w gouty arthritis. Patient prescribed indomethacin  and encouraged to have a recheck with PCP later this week.    Elpidio Anis, PA-C 02/17/16 2236  Laurence Spates, MD 02/18/16 [phone]

## 2016-02-17 NOTE — ED Notes (Signed)
Pt. reports pain/swelling at right bunion onset Friday , denies injury , ambulatory .

## 2016-02-17 NOTE — Discharge Instructions (Signed)

## 2016-02-23 DIAGNOSIS — M109 Gout, unspecified: Secondary | ICD-10-CM | POA: Diagnosis not present

## 2016-02-23 DIAGNOSIS — Z09 Encounter for follow-up examination after completed treatment for conditions other than malignant neoplasm: Secondary | ICD-10-CM | POA: Diagnosis not present

## 2016-02-23 DIAGNOSIS — Z87891 Personal history of nicotine dependence: Secondary | ICD-10-CM | POA: Diagnosis not present

## 2016-03-01 DIAGNOSIS — N5201 Erectile dysfunction due to arterial insufficiency: Secondary | ICD-10-CM | POA: Diagnosis not present

## 2016-03-01 DIAGNOSIS — N400 Enlarged prostate without lower urinary tract symptoms: Secondary | ICD-10-CM | POA: Diagnosis not present

## 2016-03-01 DIAGNOSIS — E291 Testicular hypofunction: Secondary | ICD-10-CM | POA: Diagnosis not present

## 2016-03-01 DIAGNOSIS — Z125 Encounter for screening for malignant neoplasm of prostate: Secondary | ICD-10-CM | POA: Diagnosis not present

## 2016-04-15 DIAGNOSIS — Z0000 Encounter for general adult medical examination without abnormal findings: Secondary | ICD-10-CM | POA: Diagnosis not present

## 2016-04-15 DIAGNOSIS — R7309 Other abnormal glucose: Secondary | ICD-10-CM | POA: Diagnosis not present

## 2016-04-15 DIAGNOSIS — N182 Chronic kidney disease, stage 2 (mild): Secondary | ICD-10-CM | POA: Diagnosis not present

## 2016-04-15 DIAGNOSIS — M545 Low back pain: Secondary | ICD-10-CM | POA: Diagnosis not present

## 2016-04-15 DIAGNOSIS — I129 Hypertensive chronic kidney disease with stage 1 through stage 4 chronic kidney disease, or unspecified chronic kidney disease: Secondary | ICD-10-CM | POA: Diagnosis not present

## 2016-04-15 DIAGNOSIS — Z23 Encounter for immunization: Secondary | ICD-10-CM | POA: Diagnosis not present

## 2016-04-15 DIAGNOSIS — E559 Vitamin D deficiency, unspecified: Secondary | ICD-10-CM | POA: Diagnosis not present

## 2016-04-22 DIAGNOSIS — M5136 Other intervertebral disc degeneration, lumbar region: Secondary | ICD-10-CM | POA: Diagnosis not present

## 2016-04-22 DIAGNOSIS — M545 Low back pain: Secondary | ICD-10-CM | POA: Diagnosis not present

## 2016-04-22 DIAGNOSIS — M961 Postlaminectomy syndrome, not elsewhere classified: Secondary | ICD-10-CM | POA: Diagnosis not present

## 2016-04-22 DIAGNOSIS — M4806 Spinal stenosis, lumbar region: Secondary | ICD-10-CM | POA: Diagnosis not present

## 2016-05-20 DIAGNOSIS — Z125 Encounter for screening for malignant neoplasm of prostate: Secondary | ICD-10-CM | POA: Diagnosis not present

## 2016-05-26 DIAGNOSIS — N5201 Erectile dysfunction due to arterial insufficiency: Secondary | ICD-10-CM | POA: Diagnosis not present

## 2016-05-26 DIAGNOSIS — E291 Testicular hypofunction: Secondary | ICD-10-CM | POA: Diagnosis not present

## 2016-07-15 DIAGNOSIS — N08 Glomerular disorders in diseases classified elsewhere: Secondary | ICD-10-CM | POA: Diagnosis not present

## 2016-07-15 DIAGNOSIS — Z1389 Encounter for screening for other disorder: Secondary | ICD-10-CM | POA: Diagnosis not present

## 2016-07-15 DIAGNOSIS — E1122 Type 2 diabetes mellitus with diabetic chronic kidney disease: Secondary | ICD-10-CM | POA: Diagnosis not present

## 2016-07-15 DIAGNOSIS — N182 Chronic kidney disease, stage 2 (mild): Secondary | ICD-10-CM | POA: Diagnosis not present

## 2016-07-15 DIAGNOSIS — I129 Hypertensive chronic kidney disease with stage 1 through stage 4 chronic kidney disease, or unspecified chronic kidney disease: Secondary | ICD-10-CM | POA: Diagnosis not present

## 2016-08-05 DIAGNOSIS — M545 Low back pain: Secondary | ICD-10-CM | POA: Diagnosis not present

## 2016-08-05 DIAGNOSIS — I10 Essential (primary) hypertension: Secondary | ICD-10-CM | POA: Diagnosis not present

## 2016-08-05 DIAGNOSIS — M961 Postlaminectomy syndrome, not elsewhere classified: Secondary | ICD-10-CM | POA: Diagnosis not present

## 2016-08-05 DIAGNOSIS — Z6837 Body mass index (BMI) 37.0-37.9, adult: Secondary | ICD-10-CM | POA: Diagnosis not present

## 2016-10-11 DIAGNOSIS — E119 Type 2 diabetes mellitus without complications: Secondary | ICD-10-CM | POA: Diagnosis not present

## 2016-10-11 DIAGNOSIS — H10413 Chronic giant papillary conjunctivitis, bilateral: Secondary | ICD-10-CM | POA: Diagnosis not present

## 2016-10-11 DIAGNOSIS — H25812 Combined forms of age-related cataract, left eye: Secondary | ICD-10-CM | POA: Diagnosis not present

## 2016-10-11 DIAGNOSIS — H16223 Keratoconjunctivitis sicca, not specified as Sjogren's, bilateral: Secondary | ICD-10-CM | POA: Diagnosis not present

## 2016-10-11 DIAGNOSIS — H2511 Age-related nuclear cataract, right eye: Secondary | ICD-10-CM | POA: Diagnosis not present

## 2016-11-01 DIAGNOSIS — M961 Postlaminectomy syndrome, not elsewhere classified: Secondary | ICD-10-CM | POA: Diagnosis not present

## 2016-11-01 DIAGNOSIS — I10 Essential (primary) hypertension: Secondary | ICD-10-CM | POA: Diagnosis not present

## 2016-11-01 DIAGNOSIS — M545 Low back pain: Secondary | ICD-10-CM | POA: Diagnosis not present

## 2016-11-01 DIAGNOSIS — Z6837 Body mass index (BMI) 37.0-37.9, adult: Secondary | ICD-10-CM | POA: Diagnosis not present

## 2016-11-22 DIAGNOSIS — N182 Chronic kidney disease, stage 2 (mild): Secondary | ICD-10-CM | POA: Diagnosis not present

## 2016-11-22 DIAGNOSIS — E1122 Type 2 diabetes mellitus with diabetic chronic kidney disease: Secondary | ICD-10-CM | POA: Diagnosis not present

## 2016-11-22 DIAGNOSIS — R079 Chest pain, unspecified: Secondary | ICD-10-CM | POA: Diagnosis not present

## 2016-11-22 DIAGNOSIS — N08 Glomerular disorders in diseases classified elsewhere: Secondary | ICD-10-CM | POA: Diagnosis not present

## 2016-11-22 DIAGNOSIS — I129 Hypertensive chronic kidney disease with stage 1 through stage 4 chronic kidney disease, or unspecified chronic kidney disease: Secondary | ICD-10-CM | POA: Diagnosis not present

## 2016-12-02 ENCOUNTER — Encounter: Payer: Self-pay | Admitting: Cardiology

## 2016-12-02 NOTE — Progress Notes (Signed)
Cardiology Office Note   Date:  12/03/2016   ID:  Raymond Welch, DOB 07-19-1951, MRN [id number]  PCP:  [name], MD  Cardiologist:   Rollene RotundaJames Brooklen Runquist, MD    Chief Complaint  Patient presents with  . Chest Pain      History of Present Illness: Raymond Welch is a 66 y.o. male who is referred by [name], MD for evaluation of chest pain.  He describes chest discomfort that's been going on for some time. Under his left breast. It's also some left upper arm discomfort. It comes on sporadically. Somewhat sharp. It might last for only 5-10 seconds at a time. He does not describe associated nausea vomiting or diaphoresis. He doesn't describe any presyncope or syncope. He does have some mild shortness of breath but this does not occur with chest pain. He has some rare palpitations but no presyncope or syncope. He did yard work without bringing this on. He does have cardiovascular risk factors and has never had any cardiac workup.   Past Medical History:  Diagnosis Date  . Hypertension   . Low back pain   . Pure hypercholesterolemia   . Sleep apnea    Doesn't uses CPAP "like he is supposed to"  . Type 2 diabetes mellitus (HCC)     Past Surgical History:  Procedure Laterality Date  . BACK SURGERY       Current Outpatient Prescriptions  Medication Sig Dispense Refill  . amLODipine (NORVASC) 5 MG tablet Take 5 mg by mouth daily.    Marland Kitchen. glucose blood test strip 1 each by Other route as needed for other. Use as instructed    . indomethacin (INDOCIN) 50 MG capsule Take 1 capsule (50 mg total) by mouth 3 (three) times daily with meals. 21 capsule 0  . losartan-hydrochlorothiazide (HYZAAR) 100-12.5 MG tablet Take 1 tablet by mouth daily.  2  . metFORMIN (GLUCOPHAGE) 500 MG tablet Take by mouth 2 (two) times daily with a meal.    . ONETOUCH DELICA LANCETS 33G MISC by Does not apply route.    Marland Kitchen. oxyCODONE-acetaminophen (PERCOCET) 10-325 MG tablet Take 1 tablet by mouth every 6 (six)  hours as needed for pain.    . pravastatin (PRAVACHOL) 40 MG tablet Take 40 mg by mouth daily.  2  . tadalafil (CIALIS) 20 MG tablet Take 20 mg by mouth daily as needed for erectile dysfunction.     No current facility-administered medications for this visit.     Allergies:   Patient has no known allergies.    Social History:  The patient  reports that he has quit smoking. His smoking use included Cigarettes. He has never used smokeless tobacco. He reports that he does not drink alcohol or use drugs.   Family History:  The patient's family history includes Aneurysm (age of onset: 8865) in his father; Diabetes in his mother; Heart attack (age of onset: 6365) in his mother; Lung cancer in his brother.   There is not a strong family history of early coronary disease besides his mom having her early heart attack.   ROS:  Please see the history of present illness.   Otherwise, review of systems are positive for none.   All other systems are reviewed and negative.    PHYSICAL EXAM: VS:  BP 112/76 (BP Location: Right Arm, Patient Position: Sitting, Cuff Size: Large)   Pulse 84   Ht 6\' 2"  (1.88 m)   Wt 296 lb (134.3 kg)  BMI 38.00 kg/m  , BMI Body mass index is 38 kg/m. GENERAL:  Well appearing HEENT:  Pupils equal round and reactive, fundi not visualized, oral mucosa unremarkable NECK:  No jugular venous distention, waveform within normal limits, carotid upstroke brisk and symmetric, no bruits, no thyromegaly LYMPHATICS:  No cervical, inguinal adenopathy LUNGS:  Clear to auscultation bilaterally BACK:  No CVA tenderness CHEST:  Unremarkable HEART:  PMI not displaced or sustained,S1 and S2 within normal limits, no S3, no S4, no clicks, no rubs, no murmurs ABD:  Flat, positive bowel sounds normal in frequency in pitch, no bruits, no rebound, no guarding, no midline pulsatile mass, no hepatomegaly, no splenomegaly EXT:  2 plus pulses throughout, no edema, no cyanosis no clubbing SKIN:  No  rashes no nodules NEURO:  Cranial nerves II through XII grossly intact, motor grossly intact throughout PSYCH:  Cognitively intact, oriented to person place and time    EKG:  EKG is ordered today. The ekg ordered today demonstrates sinus rhythm, rate 77, axis within normal limits, QTC slightly prolonged, no acute ST-T wave changes.   Recent Labs: No results found for requested labs within last 8760 hours.    Lipid Panel No results found for: CHOL, TRIG, HDL, CHOLHDL, VLDL, LDLCALC, LDLDIRECT    Wt Readings from Last 3 Encounters:  12/03/16 296 lb (134.3 kg)  02/17/16 299 lb (135.6 kg)  06/24/14 282 lb (127.9 kg)      Other studies Reviewed: Additional studies/ records that were reviewed today include: Office records. Review of the above records demonstrates:  Please see elsewhere in the note.     ASSESSMENT AND PLAN:   CHEST PAIN:  I will bring the patient back for a POET (Plain Old Exercise Test). This will allow me to screen for obstructive coronary disease, risk stratify and very importantly provide a prescription for exercise.  DM:   His A1c was most recently 5.9. We talked about diet and exercise.  DYSLIPIDEMIA:  His LDL was 79. HDL 39. No change in therapy is indicated.  MORBID OBESITY:  We talked at length about diet and exercise. I gave her specific instructions.  Current medicines are reviewed at length with the patient today.  The patient NO concerns regarding medicines.  The following changes have been made:  no change  Labs/ tests ordered today include:   Orders Placed This Encounter  Procedures  . EXERCISE TOLERANCE TEST  . EKG 12-Lead     Disposition:   FU with me as needed.     Signed, Rollene Rotunda, MD  12/03/2016 8:52 AM    [facility]

## 2016-12-03 ENCOUNTER — Ambulatory Visit (INDEPENDENT_AMBULATORY_CARE_PROVIDER_SITE_OTHER): Payer: PPO | Admitting: Cardiology

## 2016-12-03 ENCOUNTER — Encounter: Payer: Self-pay | Admitting: Cardiology

## 2016-12-03 VITALS — BP 112/76 | HR 84 | Ht 74.0 in | Wt 296.0 lb

## 2016-12-03 DIAGNOSIS — E785 Hyperlipidemia, unspecified: Secondary | ICD-10-CM | POA: Diagnosis not present

## 2016-12-03 DIAGNOSIS — R079 Chest pain, unspecified: Secondary | ICD-10-CM

## 2016-12-03 NOTE — Patient Instructions (Signed)
Medication Instructions: No changes  Procedures/Testing: Your physician has requested that you have an exercise tolerance test. For further information please visit [email or web address]. Please also follow instruction sheet, as given.   Follow-Up: Please follow up as needed with Dr. Antoine PocheHochrein   If you need a refill on your cardiac medications before your next appointment, please call your pharmacy.

## 2016-12-08 DIAGNOSIS — I10 Essential (primary) hypertension: Secondary | ICD-10-CM | POA: Diagnosis not present

## 2016-12-08 DIAGNOSIS — G4733 Obstructive sleep apnea (adult) (pediatric): Secondary | ICD-10-CM | POA: Diagnosis not present

## 2016-12-16 ENCOUNTER — Telehealth (HOSPITAL_COMMUNITY): Payer: Self-pay

## 2016-12-16 NOTE — Telephone Encounter (Signed)
Encounter complete. 

## 2016-12-21 ENCOUNTER — Ambulatory Visit (HOSPITAL_COMMUNITY)
Admission: RE | Admit: 2016-12-21 | Discharge: 2016-12-21 | Disposition: A | Discharge status: Home / Self Care | Payer: PPO | Source: Ambulatory Visit | Attending: Cardiology | Admitting: Cardiology

## 2016-12-21 DIAGNOSIS — R079 Chest pain, unspecified: Secondary | ICD-10-CM | POA: Insufficient documentation

## 2016-12-21 LAB — EXERCISE TOLERANCE TEST
Estimated workload: 7 METS
Exercise duration (min): 5 min
Exercise duration (sec): 20 s
MPHR: 154 {beats}/min
Peak HR: 142 {beats}/min
Percent HR: 92 %
RPE: 18
Rest HR: 83 {beats}/min

## 2016-12-29 NOTE — Progress Notes (Signed)
Cardiology Office Note   Date:  12/31/2016   ID:  Raymond Welch, DOB January 23, 1951, MRN [id number]  PCP:  Dorothyann Peng, MD  Cardiologist:   Rollene Rotunda, MD    Chief Complaint  Patient presents with  . Shortness of Breath  . Chest Pain      History of Present Illness: Raymond Welch is a 66 y.o. male who is referred by Dorothyann Peng, MD for evaluation of chest pain.  After the appt earlier this month he had a POET (Plain Old Exercise Treadmill) that was negative for ischemia.  Since I last saw him he is doing OK. The patient denies any new symptoms such as chest discomfort, neck or arm discomfort. There has been no new shortness of breath, PND or orthopnea. There have been no reported palpitations, presyncope or syncope.    Past Medical History:  Diagnosis Date  . Hypertension   . Low back pain   . Pure hypercholesterolemia   . Sleep apnea    Doesn't uses CPAP "like he is supposed to"  . Type 2 diabetes mellitus (HCC)     Past Surgical History:  Procedure Laterality Date  . BACK SURGERY       Current Outpatient Prescriptions  Medication Sig Dispense Refill  . amLODipine (NORVASC) 5 MG tablet Take 5 mg by mouth daily.    Marland Kitchen glucose blood test strip 1 each by Other route as needed for other. Use as instructed    . indomethacin (INDOCIN) 50 MG capsule Take 1 capsule (50 mg total) by mouth 3 (three) times daily with meals. 21 capsule 0  . losartan-hydrochlorothiazide (HYZAAR) 100-12.5 MG tablet Take 1 tablet by mouth daily.  2  . metFORMIN (GLUCOPHAGE) 500 MG tablet Take by mouth 2 (two) times daily with a meal.    . ONETOUCH DELICA LANCETS 33G MISC by Does not apply route.    Marland Kitchen oxyCODONE-acetaminophen (PERCOCET) 10-325 MG tablet Take 1 tablet by mouth every 6 (six) hours as needed for pain.    . pravastatin (PRAVACHOL) 40 MG tablet Take 40 mg by mouth daily.  2  . tadalafil (CIALIS) 20 MG tablet Take 20 mg by mouth daily as needed for erectile dysfunction.     No current  facility-administered medications for this visit.     Allergies:   Patient has no known allergies.    ROS:  Please see the history of present illness.   Otherwise, review of systems are positive for none.   All other systems are reviewed and negative.    PHYSICAL EXAM: VS:  BP 122/80   Pulse 78   Ht 6\' 2"  (1.88 m)   Wt 294 lb (133.4 kg)   BMI 37.75 kg/m  , BMI Body mass index is 37.75 kg/m.  GENERAL:  Well appearing NECK:  No jugular venous distention, waveform within normal limits, carotid upstroke brisk and symmetric, no bruits, no thyromegaly LYMPHATICS:  No cervical, inguinal adenopathy BACK:  No CVA tenderness CHEST:  Unremarkable HEART:  PMI not displaced or sustained,S1 and S2 within normal limits, no S3, no S4, no clicks, no rubs, no murmurs ABD:  Flat, positive bowel sounds normal in frequency in pitch, no bruits, no rebound, no guarding, no midline pulsatile mass, no hepatomegaly, no splenomegaly EXT:  2 plus pulses throughout, no edema, no cyanosis no clubbing   EKG:  EKG is not ordered today.    Recent Labs: No results found for requested labs within last 8760 hours.  Lipid Panel No results found for: CHOL, TRIG, HDL, CHOLHDL, VLDL, LDLCALC, LDLDIRECT     Wt Readings from Last 3 Encounters:  12/31/16 294 lb (133.4 kg)  12/03/16 296 lb (134.3 kg)  02/17/16 299 lb (135.6 kg)      Other studies Reviewed: Additional studies/ records that were reviewed today include:  POET (Plain Old Exercise Treadmill) Review of the above records demonstrates:  See below.     ASSESSMENT AND PLAN:   CHEST PAIN:  He had a negative POET (Plain Old Exercise Treadmill).  He is no longer having pain.  No further work up is indicated.  DM:   His A1c was most recently 5.9. He will continue therapy as listed.   DYSLIPIDEMIA:  His LDL was 79. HDL 39. He will continue meds as listed.   MORBID OBESITY:  We again talked about diet and exercise.   Current medicines are  reviewed at length with the patient today.  The patient NO concerns regarding medicines.  The following changes have been made:  None  Labs/ tests ordered today include:  None  No orders of the defined types were placed in this encounter.    Disposition:   FU with me as needed.    Signed, Rollene RotundaJames Moya Duan, MD  12/31/2016 9:10 AM    [facility]

## 2016-12-31 ENCOUNTER — Ambulatory Visit (INDEPENDENT_AMBULATORY_CARE_PROVIDER_SITE_OTHER): Payer: PPO | Admitting: Cardiology

## 2016-12-31 ENCOUNTER — Encounter: Payer: Self-pay | Admitting: Cardiology

## 2016-12-31 VITALS — BP 122/80 | HR 78 | Ht 74.0 in | Wt 294.0 lb

## 2016-12-31 DIAGNOSIS — R079 Chest pain, unspecified: Secondary | ICD-10-CM | POA: Diagnosis not present

## 2016-12-31 DIAGNOSIS — E66812 Obesity, class 2: Secondary | ICD-10-CM | POA: Insufficient documentation

## 2016-12-31 NOTE — Patient Instructions (Signed)
Medication Instructions:  Continue current medications  Labwork: None Ordered  Testing/Procedures: None Ordered  Follow-Up: Your physician recommends that you schedule a follow-up appointment in: As Needed   Any Other Special Instructions Will Be Listed Below (If Applicable).   If you need a refill on your cardiac medications before your next appointment, please call your pharmacy.   

## 2017-01-10 DIAGNOSIS — G4733 Obstructive sleep apnea (adult) (pediatric): Secondary | ICD-10-CM | POA: Diagnosis not present

## 2017-01-25 DIAGNOSIS — I10 Essential (primary) hypertension: Secondary | ICD-10-CM | POA: Diagnosis not present

## 2017-01-25 DIAGNOSIS — M961 Postlaminectomy syndrome, not elsewhere classified: Secondary | ICD-10-CM | POA: Diagnosis not present

## 2017-01-25 DIAGNOSIS — Z6837 Body mass index (BMI) 37.0-37.9, adult: Secondary | ICD-10-CM | POA: Diagnosis not present

## 2017-01-25 DIAGNOSIS — M545 Low back pain: Secondary | ICD-10-CM | POA: Diagnosis not present

## 2017-02-03 DIAGNOSIS — E1122 Type 2 diabetes mellitus with diabetic chronic kidney disease: Secondary | ICD-10-CM | POA: Diagnosis not present

## 2017-02-03 DIAGNOSIS — N182 Chronic kidney disease, stage 2 (mild): Secondary | ICD-10-CM | POA: Diagnosis not present

## 2017-02-03 DIAGNOSIS — I129 Hypertensive chronic kidney disease with stage 1 through stage 4 chronic kidney disease, or unspecified chronic kidney disease: Secondary | ICD-10-CM | POA: Diagnosis not present

## 2017-02-03 DIAGNOSIS — N08 Glomerular disorders in diseases classified elsewhere: Secondary | ICD-10-CM | POA: Diagnosis not present

## 2017-04-12 DIAGNOSIS — G4733 Obstructive sleep apnea (adult) (pediatric): Secondary | ICD-10-CM | POA: Diagnosis not present

## 2017-04-21 DIAGNOSIS — M961 Postlaminectomy syndrome, not elsewhere classified: Secondary | ICD-10-CM | POA: Diagnosis not present

## 2017-04-21 DIAGNOSIS — Z6836 Body mass index (BMI) 36.0-36.9, adult: Secondary | ICD-10-CM | POA: Diagnosis not present

## 2017-04-21 DIAGNOSIS — M545 Low back pain: Secondary | ICD-10-CM | POA: Diagnosis not present

## 2017-04-21 DIAGNOSIS — I10 Essential (primary) hypertension: Secondary | ICD-10-CM | POA: Diagnosis not present

## 2017-05-17 DIAGNOSIS — N182 Chronic kidney disease, stage 2 (mild): Secondary | ICD-10-CM | POA: Diagnosis not present

## 2017-05-17 DIAGNOSIS — M109 Gout, unspecified: Secondary | ICD-10-CM | POA: Diagnosis not present

## 2017-05-17 DIAGNOSIS — E7800 Pure hypercholesterolemia, unspecified: Secondary | ICD-10-CM | POA: Diagnosis not present

## 2017-05-17 DIAGNOSIS — E559 Vitamin D deficiency, unspecified: Secondary | ICD-10-CM | POA: Diagnosis not present

## 2017-05-17 DIAGNOSIS — R7309 Other abnormal glucose: Secondary | ICD-10-CM | POA: Diagnosis not present

## 2017-05-17 DIAGNOSIS — I129 Hypertensive chronic kidney disease with stage 1 through stage 4 chronic kidney disease, or unspecified chronic kidney disease: Secondary | ICD-10-CM | POA: Diagnosis not present

## 2017-05-17 DIAGNOSIS — Z0000 Encounter for general adult medical examination without abnormal findings: Secondary | ICD-10-CM | POA: Diagnosis not present

## 2017-05-25 DIAGNOSIS — Z125 Encounter for screening for malignant neoplasm of prostate: Secondary | ICD-10-CM | POA: Diagnosis not present

## 2017-05-25 DIAGNOSIS — N400 Enlarged prostate without lower urinary tract symptoms: Secondary | ICD-10-CM | POA: Diagnosis not present

## 2017-05-25 DIAGNOSIS — N5201 Erectile dysfunction due to arterial insufficiency: Secondary | ICD-10-CM | POA: Diagnosis not present

## 2017-05-25 DIAGNOSIS — E291 Testicular hypofunction: Secondary | ICD-10-CM | POA: Diagnosis not present

## 2017-07-13 DIAGNOSIS — G4733 Obstructive sleep apnea (adult) (pediatric): Secondary | ICD-10-CM | POA: Diagnosis not present

## 2017-07-19 DIAGNOSIS — I10 Essential (primary) hypertension: Secondary | ICD-10-CM | POA: Diagnosis not present

## 2017-07-19 DIAGNOSIS — M961 Postlaminectomy syndrome, not elsewhere classified: Secondary | ICD-10-CM | POA: Diagnosis not present

## 2017-07-19 DIAGNOSIS — Z6837 Body mass index (BMI) 37.0-37.9, adult: Secondary | ICD-10-CM | POA: Diagnosis not present

## 2017-07-19 DIAGNOSIS — M545 Low back pain: Secondary | ICD-10-CM | POA: Diagnosis not present

## 2017-07-22 ENCOUNTER — Emergency Department (HOSPITAL_COMMUNITY): Payer: PPO

## 2017-07-22 ENCOUNTER — Other Ambulatory Visit: Payer: Self-pay

## 2017-07-22 ENCOUNTER — Encounter (HOSPITAL_COMMUNITY): Payer: Self-pay | Admitting: *Deleted

## 2017-07-22 ENCOUNTER — Emergency Department (HOSPITAL_COMMUNITY)
Admission: EM | Admit: 2017-07-22 | Discharge: 2017-07-22 | Disposition: A | Discharge status: Home / Self Care | Payer: PPO | Attending: Emergency Medicine | Admitting: Emergency Medicine

## 2017-07-22 DIAGNOSIS — Z7984 Long term (current) use of oral hypoglycemic drugs: Secondary | ICD-10-CM | POA: Insufficient documentation

## 2017-07-22 DIAGNOSIS — Z87891 Personal history of nicotine dependence: Secondary | ICD-10-CM | POA: Diagnosis not present

## 2017-07-22 DIAGNOSIS — T148XXA Other injury of unspecified body region, initial encounter: Secondary | ICD-10-CM | POA: Diagnosis not present

## 2017-07-22 DIAGNOSIS — S3992XA Unspecified injury of lower back, initial encounter: Secondary | ICD-10-CM | POA: Diagnosis not present

## 2017-07-22 DIAGNOSIS — Y9389 Activity, other specified: Secondary | ICD-10-CM | POA: Diagnosis not present

## 2017-07-22 DIAGNOSIS — S99912A Unspecified injury of left ankle, initial encounter: Secondary | ICD-10-CM | POA: Diagnosis not present

## 2017-07-22 DIAGNOSIS — M79672 Pain in left foot: Secondary | ICD-10-CM | POA: Diagnosis not present

## 2017-07-22 DIAGNOSIS — Y92410 Unspecified street and highway as the place of occurrence of the external cause: Secondary | ICD-10-CM | POA: Diagnosis not present

## 2017-07-22 DIAGNOSIS — E119 Type 2 diabetes mellitus without complications: Secondary | ICD-10-CM | POA: Diagnosis not present

## 2017-07-22 DIAGNOSIS — S199XXA Unspecified injury of neck, initial encounter: Secondary | ICD-10-CM | POA: Diagnosis not present

## 2017-07-22 DIAGNOSIS — M25572 Pain in left ankle and joints of left foot: Secondary | ICD-10-CM | POA: Diagnosis not present

## 2017-07-22 DIAGNOSIS — Y999 Unspecified external cause status: Secondary | ICD-10-CM | POA: Diagnosis not present

## 2017-07-22 DIAGNOSIS — Z79899 Other long term (current) drug therapy: Secondary | ICD-10-CM | POA: Diagnosis not present

## 2017-07-22 DIAGNOSIS — M542 Cervicalgia: Secondary | ICD-10-CM | POA: Diagnosis not present

## 2017-07-22 DIAGNOSIS — I10 Essential (primary) hypertension: Secondary | ICD-10-CM | POA: Diagnosis not present

## 2017-07-22 DIAGNOSIS — M545 Low back pain, unspecified: Secondary | ICD-10-CM

## 2017-07-22 DIAGNOSIS — S99922A Unspecified injury of left foot, initial encounter: Secondary | ICD-10-CM | POA: Diagnosis not present

## 2017-07-22 MED ORDER — OXYCODONE-ACETAMINOPHEN 5-325 MG PO TABS
1.0000 | ORAL_TABLET | Freq: Once | ORAL | Status: AC
  Administered 2017-07-22: 1 via ORAL
  Filled 2017-07-22: qty 1

## 2017-07-22 MED ORDER — METHOCARBAMOL 500 MG PO TABS: 500.0000 mg | ORAL_TABLET | Freq: Two times a day (BID) | ORAL | 0 refills | Status: DC | PRN

## 2017-07-22 NOTE — Discharge Instructions (Signed)
°
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
## 2017-07-24 ENCOUNTER — Encounter (HOSPITAL_COMMUNITY): Payer: Self-pay | Admitting: Emergency Medicine

## 2017-07-24 ENCOUNTER — Emergency Department (HOSPITAL_COMMUNITY)
Admission: EM | Admit: 2017-07-24 | Discharge: 2017-07-24 | Disposition: A | Discharge status: Home / Self Care | Payer: PPO | Attending: Emergency Medicine | Admitting: Emergency Medicine

## 2017-07-24 DIAGNOSIS — S39012D Strain of muscle, fascia and tendon of lower back, subsequent encounter: Secondary | ICD-10-CM | POA: Diagnosis not present

## 2017-07-24 DIAGNOSIS — S161XXA Strain of muscle, fascia and tendon at neck level, initial encounter: Secondary | ICD-10-CM

## 2017-07-24 DIAGNOSIS — S161XXD Strain of muscle, fascia and tendon at neck level, subsequent encounter: Secondary | ICD-10-CM | POA: Diagnosis not present

## 2017-07-24 DIAGNOSIS — S39012A Strain of muscle, fascia and tendon of lower back, initial encounter: Secondary | ICD-10-CM | POA: Diagnosis not present

## 2017-07-24 DIAGNOSIS — G8911 Acute pain due to trauma: Secondary | ICD-10-CM | POA: Diagnosis present
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
## 2017-07-24 NOTE — ED Notes (Signed)
[facility] and released.

## 2017-07-26 ENCOUNTER — Encounter (HOSPITAL_COMMUNITY): Payer: Self-pay

## 2017-07-26 ENCOUNTER — Emergency Department (HOSPITAL_COMMUNITY)
Admission: EM | Admit: 2017-07-26 | Discharge: 2017-07-26 | Discharge status: Against Medical Advice | Payer: PPO | Attending: Emergency Medicine | Admitting: Emergency Medicine

## 2017-07-26 ENCOUNTER — Other Ambulatory Visit: Payer: Self-pay

## 2017-07-26 DIAGNOSIS — R6883 Chills (without fever): Secondary | ICD-10-CM | POA: Diagnosis not present

## 2017-07-26 DIAGNOSIS — Z5321 Procedure and treatment not carried out due to patient leaving prior to being seen by health care provider: Secondary | ICD-10-CM | POA: Diagnosis not present

## 2017-07-26 LAB — COMPREHENSIVE METABOLIC PANEL
ALT: 20 U/L (ref 17–63)
AST: 26 U/L (ref 15–41)
Albumin: 3.8 g/dL (ref 3.5–5.0)
Alkaline Phosphatase: 43 U/L (ref 38–126)
Anion gap: 9 (ref 5–15)
BUN: 13 mg/dL (ref 6–20)
CO2: 25 mmol/L (ref 22–32)
Calcium: 9.4 mg/dL (ref 8.9–10.3)
Chloride: 104 mmol/L (ref 101–111)
Creatinine, Ser: 1.28 mg/dL — ABNORMAL HIGH (ref 0.61–1.24)
GFR calc Af Amer: >60 mL/min (ref >60)
GFR calc non Af Amer: 56 mL/min — ABNORMAL LOW (ref >60)
Glucose, Bld: 126 mg/dL — ABNORMAL HIGH (ref 65–99)
Potassium: 3.7 mmol/L (ref 3.5–5.1)
Sodium: 138 mmol/L (ref 135–145)
Total Bilirubin: 0.8 mg/dL (ref 0.3–1.2)
Total Protein: 7.1 g/dL (ref 6.5–8.1)

## 2017-07-26 LAB — CBC WITH DIFFERENTIAL/PLATELET
Basophils Absolute: 0 K/uL (ref 0.0–0.1)
Basophils Relative: 0 %
Eosinophils Absolute: 0 K/uL (ref 0.0–0.7)
Eosinophils Relative: 1 %
HCT: 42.1 % (ref 39.0–52.0)
Hemoglobin: 13.8 g/dL (ref 13.0–17.0)
Lymphocytes Relative: 21 %
Lymphs Abs: 1 K/uL (ref 0.7–4.0)
MCH: 30.2 pg (ref 26.0–34.0)
MCHC: 32.8 g/dL (ref 30.0–36.0)
MCV: 92.1 fL (ref 78.0–100.0)
Monocytes Absolute: 0.4 K/uL (ref 0.1–1.0)
Monocytes Relative: 8 %
Neutro Abs: 3.3 K/uL (ref 1.7–7.7)
Neutrophils Relative %: 70 %
Platelets: 186 K/uL (ref 150–400)
RBC: 4.57 MIL/uL (ref 4.22–5.81)
RDW: 12.7 % (ref 11.5–15.5)
WBC: 4.7 K/uL (ref 4.0–10.5)

## 2017-07-26 LAB — URINALYSIS, ROUTINE W REFLEX MICROSCOPIC
Bacteria, UA: NONE SEEN
Bilirubin Urine: NEGATIVE
Glucose, UA: NEGATIVE mg/dL
Ketones, ur: NEGATIVE mg/dL
Leukocytes, UA: NEGATIVE
Nitrite: NEGATIVE
Protein, ur: NEGATIVE mg/dL
Specific Gravity, Urine: 1.019 (ref 1.005–1.030)
Squamous Epithelial / HPF: NONE SEEN
pH: 5 (ref 5.0–8.0)

## 2017-07-26 LAB — I-STAT CG4 LACTIC ACID, ED: Lactic Acid, Venous: 1.13 mmol/L (ref 0.5–1.9)
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
## 2017-07-28 ENCOUNTER — Other Ambulatory Visit: Payer: Self-pay
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
## 2017-07-28 NOTE — Patient Outreach (Signed)
[facility]) Care Management  07/28/2017  Raymond Welch [birth year] [id number]   TELEPHONE SCREENING Referral date: 07/28/17 Referral source: patient engagement referral Insurance: Health team advantage  Attempt #1  Telephone call to patient regarding patient engagement referral.  Unable to reach patient. HIPAA compliant voice message left with call back phone number.   PLAN: RNCM will attempt 2nd telephone call to patient within 5 business days.   [name] RN,BSN,CCM [facility] Telephonic  [phone]

## 2017-07-29 ENCOUNTER — Other Ambulatory Visit: Payer: Self-pay

## 2017-07-29 NOTE — Patient Outreach (Signed)
[facility]) Care Management  07/29/2017  Raymond Welch 1951/01/12 [id number]   TELEPHONE SCREENING Referral date: 07/28/17 Referral source: patient engagement referral Insurance: Health team advantage    Telephone call to patient regarding patient engagement referral. HIPAA verified with patient. D[facility] and [facility]. Patient refuses services at this time. RNCM offered to [facility]/ brochure and magnet. Patient verbally agreed.   PLAN; RNCM will refer patient to care management assistant to close due to refusal of services.  RNCM will send patient outreach letter/ brochure and magnet as discussed  RNCM will send notification to patients primary MD of closure.   George [name] RN,BSN,CCM [facility] Telephonic  [phone]

## 2017-08-01 ENCOUNTER — Ambulatory Visit: Payer: Self-pay

## 2017-08-03 DIAGNOSIS — M545 Low back pain: Secondary | ICD-10-CM | POA: Diagnosis not present

## 2017-08-03 DIAGNOSIS — M542 Cervicalgia: Secondary | ICD-10-CM | POA: Diagnosis not present

## 2017-08-03 DIAGNOSIS — Z09 Encounter for follow-up examination after completed treatment for conditions other than malignant neoplasm: Secondary | ICD-10-CM | POA: Diagnosis not present

## 2017-08-11 ENCOUNTER — Other Ambulatory Visit (HOSPITAL_COMMUNITY): Payer: Self-pay | Admitting: Chiropractic Medicine

## 2017-08-11 ENCOUNTER — Ambulatory Visit (HOSPITAL_COMMUNITY)
Admission: RE | Admit: 2017-08-11 | Discharge: 2017-08-11 | Disposition: A | Discharge status: Home / Self Care | Payer: PPO | Source: Ambulatory Visit | Attending: Chiropractic Medicine | Admitting: Chiropractic Medicine

## 2017-08-11 DIAGNOSIS — M545 Low back pain: Secondary | ICD-10-CM | POA: Diagnosis not present

## 2017-08-11 DIAGNOSIS — I700 Atherosclerosis of aorta: Secondary | ICD-10-CM | POA: Insufficient documentation

## 2017-08-11 DIAGNOSIS — M542 Cervicalgia: Secondary | ICD-10-CM

## 2017-08-11 DIAGNOSIS — I6523 Occlusion and stenosis of bilateral carotid arteries: Secondary | ICD-10-CM | POA: Diagnosis not present

## 2017-08-11 DIAGNOSIS — M47894 Other spondylosis, thoracic region: Secondary | ICD-10-CM | POA: Diagnosis not present

## 2017-08-11 DIAGNOSIS — M546 Pain in thoracic spine: Secondary | ICD-10-CM

## 2017-08-11 DIAGNOSIS — R102 Pelvic and perineal pain: Secondary | ICD-10-CM

## 2017-08-11 DIAGNOSIS — M47892 Other spondylosis, cervical region: Secondary | ICD-10-CM | POA: Insufficient documentation

## 2017-08-11 DIAGNOSIS — M4184 Other forms of scoliosis, thoracic region: Secondary | ICD-10-CM | POA: Insufficient documentation

## 2017-08-11 DIAGNOSIS — M47896 Other spondylosis, lumbar region: Secondary | ICD-10-CM | POA: Insufficient documentation

## 2017-08-11 DIAGNOSIS — S3992XA Unspecified injury of lower back, initial encounter: Secondary | ICD-10-CM | POA: Diagnosis not present

## 2017-08-11 DIAGNOSIS — S199XXA Unspecified injury of neck, initial encounter: Secondary | ICD-10-CM | POA: Diagnosis not present

## 2017-09-21 ENCOUNTER — Encounter: Payer: Self-pay | Admitting: Cardiovascular Disease

## 2017-09-21 DIAGNOSIS — N08 Glomerular disorders in diseases classified elsewhere: Secondary | ICD-10-CM | POA: Diagnosis not present

## 2017-09-21 DIAGNOSIS — N182 Chronic kidney disease, stage 2 (mild): Secondary | ICD-10-CM | POA: Diagnosis not present

## 2017-09-21 DIAGNOSIS — E1122 Type 2 diabetes mellitus with diabetic chronic kidney disease: Secondary | ICD-10-CM | POA: Diagnosis not present

## 2017-09-21 DIAGNOSIS — R0602 Shortness of breath: Secondary | ICD-10-CM | POA: Diagnosis not present

## 2017-09-21 DIAGNOSIS — I129 Hypertensive chronic kidney disease with stage 1 through stage 4 chronic kidney disease, or unspecified chronic kidney disease: Secondary | ICD-10-CM | POA: Diagnosis not present

## 2017-10-11 DIAGNOSIS — M545 Low back pain: Secondary | ICD-10-CM | POA: Diagnosis not present

## 2017-10-11 DIAGNOSIS — M961 Postlaminectomy syndrome, not elsewhere classified: Secondary | ICD-10-CM | POA: Diagnosis not present

## 2017-10-14 DIAGNOSIS — H2511 Age-related nuclear cataract, right eye: Secondary | ICD-10-CM | POA: Diagnosis not present

## 2017-10-14 DIAGNOSIS — H40053 Ocular hypertension, bilateral: Secondary | ICD-10-CM | POA: Diagnosis not present

## 2017-10-14 DIAGNOSIS — H16223 Keratoconjunctivitis sicca, not specified as Sjogren's, bilateral: Secondary | ICD-10-CM | POA: Diagnosis not present

## 2017-10-14 DIAGNOSIS — E119 Type 2 diabetes mellitus without complications: Secondary | ICD-10-CM | POA: Diagnosis not present

## 2017-10-14 DIAGNOSIS — H25812 Combined forms of age-related cataract, left eye: Secondary | ICD-10-CM | POA: Diagnosis not present

## 2017-11-03 ENCOUNTER — Ambulatory Visit: Payer: PPO | Admitting: Cardiovascular Disease

## 2017-11-03 NOTE — Progress Notes (Deleted)
Cardiology Office Note   Date:  11/03/2017   ID:  Raymond Welch, DOB 08-11-1950, MRN [id number]  PCP:  Dorothyann PengSanders, Robyn, MD  Cardiologist:   Chilton Siiffany Peggs, MD   No chief complaint on file.     History of Present Illness: Raymond Welch is a 67 y.o. male with morbid obesity, hypertension, hyperlipidemia, diabetes, and sleep apnea who is being seen today for the evaluation of *** at the request of Dorothyann PengSanders, Robyn, MD.     Past Medical History:  Diagnosis Date  . Hypertension   . Low back pain   . Pure hypercholesterolemia   . Sleep apnea    Doesn't uses CPAP "like he is supposed to"  . Type 2 diabetes mellitus (HCC)     Past Surgical History:  Procedure Laterality Date  . BACK SURGERY       Current Outpatient Medications  Medication Sig Dispense Refill  . amLODipine (NORVASC) 5 MG tablet Take 5 mg by mouth daily.    Marland Kitchen. glucose blood test strip 1 each by Other route as needed for other. Use as instructed    . indomethacin (INDOCIN) 50 MG capsule Take 1 capsule (50 mg total) by mouth 3 (three) times daily with meals. 21 capsule 0  . losartan-hydrochlorothiazide (HYZAAR) 100-12.5 MG tablet Take 1 tablet by mouth daily.  2  . metFORMIN (GLUCOPHAGE) 500 MG tablet Take by mouth 2 (two) times daily with a meal.    . methocarbamol (ROBAXIN) 500 MG tablet Take 1 tablet (500 mg total) by mouth 2 (two) times daily as needed for muscle spasms. 20 tablet 0  . ONETOUCH DELICA LANCETS 33G MISC by Does not apply route.    Marland Kitchen. oxyCODONE-acetaminophen (PERCOCET) 10-325 MG tablet Take 1 tablet by mouth every 6 (six) hours as needed for pain.    . pravastatin (PRAVACHOL) 40 MG tablet Take 40 mg by mouth daily.  2  . tadalafil (CIALIS) 20 MG tablet Take 20 mg by mouth daily as needed for erectile dysfunction.     No current facility-administered medications for this visit.     Allergies:   Patient has no known allergies.    Social History:  The patient  reports that he has quit smoking. His  smoking use included cigarettes. He has never used smokeless tobacco. He reports that he does not drink alcohol or use drugs.   Family History:  The patient's ***family history includes Aneurysm (age of onset: 3465) in his father; Diabetes in his mother; Heart attack (age of onset: 3065) in his mother; Lung cancer in his brother.    ROS:  Please see the history of present illness.   Otherwise, review of systems are positive for {NONE DEFAULTED:18576::"none"}.   All other systems are reviewed and negative.    PHYSICAL EXAM: VS:  There were no vitals taken for this visit. , BMI There is no height or weight on file to calculate BMI. GENERAL:  Well appearing HEENT:  Pupils equal round and reactive, fundi not visualized, oral mucosa unremarkable NECK:  No jugular venous distention, waveform within normal limits, carotid upstroke brisk and symmetric, no bruits, no thyromegaly LYMPHATICS:  No cervical adenopathy LUNGS:  Clear to auscultation bilaterally HEART:  RRR.  PMI not displaced or sustained,S1 and S2 within normal limits, no S3, no S4, no clicks, no rubs, *** murmurs ABD:  Flat, positive bowel sounds normal in frequency in pitch, no bruits, no rebound, no guarding, no midline pulsatile mass, no hepatomegaly, no splenomegaly EXT:  2 plus pulses throughout, no edema, no cyanosis no clubbing SKIN:  No rashes no nodules NEURO:  Cranial nerves II through XII grossly intact, motor grossly intact throughout PSYCH:  Cognitively intact, oriented to person place and time    EKG:  EKG {ACTION; IS/IS ZOX:09604540} ordered today. The ekg ordered today demonstrates ***   Recent Labs: 07/26/2017: ALT 20; BUN 13; Creatinine, Ser 1.28; Hemoglobin 13.8; Platelets 186; Potassium 3.7; Sodium 138    Lipid Panel No results found for: CHOL, TRIG, HDL, CHOLHDL, VLDL, LDLCALC, LDLDIRECT    Wt Readings from Last 3 Encounters:  07/22/17 (!) 301 lb (136.5 kg)  12/31/16 294 lb (133.4 kg)  12/03/16 296 lb  (134.3 kg)      ASSESSMENT AND PLAN:  ***   Current medicines are reviewed at length with the patient today.  The patient {ACTIONS; HAS/DOES NOT HAVE:19233} concerns regarding medicines.  The following changes have been made:  {PLAN; NO CHANGE:13088:s}  Labs/ tests ordered today include: *** No orders of the defined types were placed in this encounter.    Disposition:   FU with ***    This note was written with the assistance of speech recognition software.  Please excuse any transcriptional errors.  Signed, Raeanna Soberanes C. Duke Salvia, MD, [facility]  11/03/2017 9:18 AM    [facility]

## 2017-11-30 ENCOUNTER — Encounter: Payer: Self-pay | Admitting: Cardiology

## 2017-11-30 ENCOUNTER — Ambulatory Visit (INDEPENDENT_AMBULATORY_CARE_PROVIDER_SITE_OTHER): Payer: Medicare Other | Admitting: Cardiology

## 2017-11-30 VITALS — BP 120/83 | HR 77 | Ht 74.0 in | Wt 300.0 lb

## 2017-11-30 DIAGNOSIS — E785 Hyperlipidemia, unspecified: Secondary | ICD-10-CM | POA: Diagnosis not present

## 2017-11-30 DIAGNOSIS — E119 Type 2 diabetes mellitus without complications: Secondary | ICD-10-CM | POA: Diagnosis not present

## 2017-11-30 DIAGNOSIS — G4730 Sleep apnea, unspecified: Secondary | ICD-10-CM | POA: Insufficient documentation

## 2017-11-30 DIAGNOSIS — I10 Essential (primary) hypertension: Secondary | ICD-10-CM | POA: Diagnosis not present

## 2017-11-30 DIAGNOSIS — R079 Chest pain, unspecified: Secondary | ICD-10-CM | POA: Diagnosis not present

## 2017-11-30 DIAGNOSIS — R0609 Other forms of dyspnea: Secondary | ICD-10-CM

## 2017-11-30 DIAGNOSIS — G4733 Obstructive sleep apnea (adult) (pediatric): Secondary | ICD-10-CM | POA: Insufficient documentation

## 2017-11-30 DIAGNOSIS — R0600 Dyspnea, unspecified: Secondary | ICD-10-CM

## 2017-11-30 NOTE — Assessment & Plan Note (Signed)
BMI 38 

## 2017-11-30 NOTE — Assessment & Plan Note (Signed)
Negative POET June 2018 Recurrent symptoms with DOE- multiple cardiac RF including DM-r/o CAD

## 2017-11-30 NOTE — Assessment & Plan Note (Signed)
Not complaint with C-pap 

## 2017-11-30 NOTE — Assessment & Plan Note (Signed)
Controlled.  

## 2017-11-30 NOTE — Assessment & Plan Note (Signed)
On oral agents 

## 2017-11-30 NOTE — Assessment & Plan Note (Signed)
On statin Rx, followed by Dr Allyne Gee

## 2017-11-30 NOTE — Progress Notes (Signed)
11/30/2017 Raymond Welch   16-Jun-1951  [id number]  Primary Physician Dorothyann Peng, MD Primary Cardiologist: Dr Antoine Poche  HPI:  67 y/o obese AA male with a history of HTN, NIDDM, HLD, and OSA-(not compliant). We saw him in May 2018 for chest pain, he had a negative POET then. He was referred to [place] today by Dr Allyne Gee because he has noticed some left sided chest discomfort and DOE. This has been going on for a month. The chest pain is hard for him to describe but he says its not sharp, not associated with nausea or diaphoresis, it does radiate to the center of his chest. He also complains of exertional dyspnea, worse for the past months.    Current Outpatient Medications  Medication Sig Dispense Refill  . amLODipine (NORVASC) 5 MG tablet Take 5 mg by mouth daily.    Marland Kitchen glucose blood test strip 1 each by Other route as needed for other. Use as instructed    . indomethacin (INDOCIN) 50 MG capsule Take 1 capsule (50 mg total) by mouth 3 (three) times daily with meals. 21 capsule 0  . losartan-hydrochlorothiazide (HYZAAR) 100-12.5 MG tablet Take 1 tablet by mouth daily.  2  . metFORMIN (GLUCOPHAGE) 500 MG tablet Take by mouth 2 (two) times daily with a meal.    . methocarbamol (ROBAXIN) 500 MG tablet Take 1 tablet (500 mg total) by mouth 2 (two) times daily as needed for muscle spasms. 20 tablet 0  . ONETOUCH DELICA LANCETS 33G MISC by Does not apply route.    Marland Kitchen oxyCODONE-acetaminophen (PERCOCET) 10-325 MG tablet Take 1 tablet by mouth every 6 (six) hours as needed for pain.    . pravastatin (PRAVACHOL) 40 MG tablet Take 40 mg by mouth daily.  2  . tadalafil (CIALIS) 20 MG tablet Take 20 mg by mouth daily as needed for erectile dysfunction.     No current facility-administered medications for this visit.     No Known Allergies  Past Medical History:  Diagnosis Date  . Hypertension   . Low back pain   . Pure hypercholesterolemia   . Sleep apnea    Doesn't uses CPAP "like he is supposed  to"  . Type 2 diabetes mellitus (HCC)     Social History   Socioeconomic History  . Marital status: Married    Spouse name: Not on file  . Number of children: 8  . Years of education: Not on file  . Highest education level: Not on file  Occupational History  . Occupation: Location manager  Social Needs  . Financial resource strain: Not on file  . Food insecurity:    Worry: Not on file    Inability: Not on file  . Transportation needs:    Medical: Not on file    Non-medical: Not on file  Tobacco Use  . Smoking status: Former Smoker    Types: Cigarettes  . Smokeless tobacco: Never Used  . Tobacco comment: Quit 8 years ago.   Substance and Sexual Activity  . Alcohol use: No  . Drug use: No  . Sexual activity: Not on file  Lifestyle  . Physical activity:    Days per week: Not on file    Minutes per session: Not on file  . Stress: Not on file  Relationships  . Social connections:    Talks on phone: Not on file    Gets together: Not on file    Attends religious service: Not on file  Active member of club or organization: Not on file    Attends meetings of clubs or organizations: Not on file    Relationship status: Not on file  . Intimate partner violence:    Fear of current or ex partner: Not on file    Emotionally abused: Not on file    Physically abused: Not on file    Forced sexual activity: Not on file  Other Topics Concern  . Not on file  Social History Narrative   Lives at home with wife.       Family History  Problem Relation Age of Onset  . Diabetes Mother   . Heart attack Mother 98       Died age 48  . Aneurysm Father 94  . Lung cancer Brother      Review of Systems: General: negative for chills, fever, night sweats or weight changes.  Cardiovascular: negative for edema, orthopnea, palpitations, paroxysmal nocturnal dyspnea or shortness of breath Dermatological: negative for rash Respiratory: negative for cough or wheezing Urologic: negative  for hematuria Abdominal: negative for nausea, vomiting, diarrhea, bright red blood per rectum, melena, or hematemesis Neurologic: negative for visual changes, syncope, or dizziness All other systems reviewed and are otherwise negative except as noted above.    Blood pressure 120/83, pulse 77, height  (1.88 m), weight 300 lb (136.1 kg).  General appearance: alert, cooperative, no distress and morbidly obese Neck: no carotid bruit and no JVD Lungs: clear to auscultation bilaterally Heart: regular rate and rhythm Abdomen: obese, non tender Extremities: extremities normal, atraumatic, no cyanosis or edema Skin: Skin color, texture, turgor normal. No rashes or lesions Neurologic: Grossly normal  EKG NSR  ASSESSMENT AND PLAN:   Chest pain Negative POET June 2018 Recurrent symptoms with DOE- multiple cardiac RF including DM-r/o CAD  Morbid obesity (HCC) BMI 38  Non-insulin treated type 2 diabetes mellitus (HCC) On oral agents  Essential hypertension Controlled  Sleep apnea Not complaint with C-pap  Dyslipidemia On statin Rx, followed by Dr Valli Glance  Will repeat treadmill with Myoview. I suggested he resume C-pap. F/U depending on Myoview.   Corine Shelter PA-C 11/30/2017 9:45 AM

## 2017-11-30 NOTE — H&P (View-Only) (Signed)
  11/30/2017 Raymond Welch   10/25/1950  [id number]  Primary Physician Sanders, Robyn, MD Primary Cardiologist: Dr Hochrein  HPI:  66 y/o obese AA male with a history of HTN, NIDDM, HLD, and OSA-(not compliant). We saw him in May 2018 for chest pain, he had a negative POET then. He was referred to us today by Dr Sanders because he has noticed some left sided chest discomfort and DOE. This has been going on for a month. The chest pain is hard for him to describe but he says its not sharp, not associated with nausea or diaphoresis, it does radiate to the center of his chest. He also complains of exertional dyspnea, worse for the past months.    Current Outpatient Medications  Medication Sig Dispense Refill  . amLODipine (NORVASC) 5 MG tablet Take 5 mg by mouth daily.    . glucose blood test strip 1 each by Other route as needed for other. Use as instructed    . indomethacin (INDOCIN) 50 MG capsule Take 1 capsule (50 mg total) by mouth 3 (three) times daily with meals. 21 capsule 0  . losartan-hydrochlorothiazide (HYZAAR) 100-12.5 MG tablet Take 1 tablet by mouth daily.  2  . metFORMIN (GLUCOPHAGE) 500 MG tablet Take by mouth 2 (two) times daily with a meal.    . methocarbamol (ROBAXIN) 500 MG tablet Take 1 tablet (500 mg total) by mouth 2 (two) times daily as needed for muscle spasms. 20 tablet 0  . ONETOUCH DELICA LANCETS 33G MISC by Does not apply route.    . oxyCODONE-acetaminophen (PERCOCET) 10-325 MG tablet Take 1 tablet by mouth every 6 (six) hours as needed for pain.    . pravastatin (PRAVACHOL) 40 MG tablet Take 40 mg by mouth daily.  2  . tadalafil (CIALIS) 20 MG tablet Take 20 mg by mouth daily as needed for erectile dysfunction.     No current facility-administered medications for this visit.     No Known Allergies  Past Medical History:  Diagnosis Date  . Hypertension   . Low back pain   . Pure hypercholesterolemia   . Sleep apnea    Doesn't uses CPAP "like he is supposed  to"  . Type 2 diabetes mellitus (HCC)     Social History   Socioeconomic History  . Marital status: Married    Spouse name: Not on file  . Number of children: 8  . Years of education: Not on file  . Highest education level: Not on file  Occupational History  . Occupation: Machine operator  Social Needs  . Financial resource strain: Not on file  . Food insecurity:    Worry: Not on file    Inability: Not on file  . Transportation needs:    Medical: Not on file    Non-medical: Not on file  Tobacco Use  . Smoking status: Former Smoker    Types: Cigarettes  . Smokeless tobacco: Never Used  . Tobacco comment: Quit 8 years ago.   Substance and Sexual Activity  . Alcohol use: No  . Drug use: No  . Sexual activity: Not on file  Lifestyle  . Physical activity:    Days per week: Not on file    Minutes per session: Not on file  . Stress: Not on file  Relationships  . Social connections:    Talks on phone: Not on file    Gets together: Not on file    Attends religious service: Not on file      Active member of club or organization: Not on file    Attends meetings of clubs or organizations: Not on file    Relationship status: Not on file  . Intimate partner violence:    Fear of current or ex partner: Not on file    Emotionally abused: Not on file    Physically abused: Not on file    Forced sexual activity: Not on file  Other Topics Concern  . Not on file  Social History Narrative   Lives at home with wife.       Family History  Problem Relation Age of Onset  . Diabetes Mother   . Heart attack Mother 98       Died age 48  . Aneurysm Father 94  . Lung cancer Brother      Review of Systems: General: negative for chills, fever, night sweats or weight changes.  Cardiovascular: negative for edema, orthopnea, palpitations, paroxysmal nocturnal dyspnea or shortness of breath Dermatological: negative for rash Respiratory: negative for cough or wheezing Urologic: negative  for hematuria Abdominal: negative for nausea, vomiting, diarrhea, bright red blood per rectum, melena, or hematemesis Neurologic: negative for visual changes, syncope, or dizziness All other systems reviewed and are otherwise negative except as noted above.    Blood pressure 120/83, pulse 77, height  (1.88 m), weight 300 lb (136.1 kg).  General appearance: alert, cooperative, no distress and morbidly obese Neck: no carotid bruit and no JVD Lungs: clear to auscultation bilaterally Heart: regular rate and rhythm Abdomen: obese, non tender Extremities: extremities normal, atraumatic, no cyanosis or edema Skin: Skin color, texture, turgor normal. No rashes or lesions Neurologic: Grossly normal  EKG NSR  ASSESSMENT AND PLAN:   Chest pain Negative POET June 2018 Recurrent symptoms with DOE- multiple cardiac RF including DM-r/o CAD  Morbid obesity (HCC) BMI 38  Non-insulin treated type 2 diabetes mellitus (HCC) On oral agents  Essential hypertension Controlled  Sleep apnea Not complaint with C-pap  Dyslipidemia On statin Rx, followed by Dr Valli Glance  Will repeat treadmill with Myoview. I suggested he resume C-pap. F/U depending on Myoview.   Corine Shelter PA-C 11/30/2017 9:45 AM

## 2017-11-30 NOTE — Patient Instructions (Signed)
Medication Instructions:  Your physician recommends that you continue on your current medications as directed. Please refer to the Current Medication list given to you today.  Labwork: None   Testing/Procedures: Your physician has requested that you have en exercise stress myoview. For further information please visit [email or web address] Please follow instruction sheet, as given.  PER LUKE DOES NOT NEED TO HOLD ANY MEDICATIONS BEFORE TEST  Follow-Up: Your physician recommends that you schedule a follow-up appointment in: 6-8 weeks with Dr Antoine Poche   Any Other Special Instructions Will Be Listed Below (If Applicable).  If you need a refill on your cardiac medications before your next appointment, please call your pharmacy.

## 2017-12-02 ENCOUNTER — Telehealth (HOSPITAL_COMMUNITY): Payer: Self-pay

## 2017-12-02 NOTE — Telephone Encounter (Signed)
Encounter complete. 

## 2017-12-07 ENCOUNTER — Ambulatory Visit (HOSPITAL_COMMUNITY)
Admission: RE | Admit: 2017-12-07 | Discharge: 2017-12-07 | Disposition: A | Discharge status: Home / Self Care | Payer: Medicare Other | Source: Ambulatory Visit | Attending: Cardiology | Admitting: Cardiology

## 2017-12-07 DIAGNOSIS — R0609 Other forms of dyspnea: Secondary | ICD-10-CM | POA: Insufficient documentation

## 2017-12-07 DIAGNOSIS — I10 Essential (primary) hypertension: Secondary | ICD-10-CM | POA: Insufficient documentation

## 2017-12-07 DIAGNOSIS — G4733 Obstructive sleep apnea (adult) (pediatric): Secondary | ICD-10-CM | POA: Diagnosis not present

## 2017-12-07 DIAGNOSIS — E119 Type 2 diabetes mellitus without complications: Secondary | ICD-10-CM | POA: Diagnosis not present

## 2017-12-07 DIAGNOSIS — E669 Obesity, unspecified: Secondary | ICD-10-CM | POA: Diagnosis not present

## 2017-12-07 DIAGNOSIS — R079 Chest pain, unspecified: Secondary | ICD-10-CM

## 2017-12-07 DIAGNOSIS — Z8249 Family history of ischemic heart disease and other diseases of the circulatory system: Secondary | ICD-10-CM | POA: Insufficient documentation

## 2017-12-07 DIAGNOSIS — I519 Heart disease, unspecified: Secondary | ICD-10-CM | POA: Diagnosis not present

## 2017-12-07 MED ORDER — TECHNETIUM TC 99M TETROFOSMIN IV KIT
32.3000 | PACK | Freq: Once | INTRAVENOUS | Status: AC | PRN
  Administered 2017-12-07: 32.3 via INTRAVENOUS
  Filled 2017-12-07: qty 33

## 2017-12-08 ENCOUNTER — Ambulatory Visit (HOSPITAL_COMMUNITY)
Admission: RE | Admit: 2017-12-08 | Discharge: 2017-12-08 | Disposition: A | Discharge status: Home / Self Care | Payer: Medicare Other | Source: Ambulatory Visit | Attending: Cardiovascular Disease | Admitting: Cardiovascular Disease

## 2017-12-08 LAB — MYOCARDIAL PERFUSION IMAGING
Estimated workload: 7 METS
Exercise duration (min): 5 min
Exercise duration (sec): 30 s
LV dias vol: 159 mL (ref 62–150)
LV sys vol: 117 mL
MPHR: 153 {beats}/min
Peak HR: 146 {beats}/min
Percent HR: 95 %
RPE: 18
Rest HR: 75 {beats}/min
SDS: 2
SRS: 2
SSS: 4
TID: 0.82

## 2017-12-08 MED ORDER — TECHNETIUM TC 99M TETROFOSMIN IV KIT
32.0000 | PACK | Freq: Once | INTRAVENOUS | Status: AC | PRN
  Administered 2017-12-08: 32 via INTRAVENOUS

## 2017-12-09 ENCOUNTER — Telehealth: Payer: Self-pay | Admitting: *Deleted

## 2017-12-09 DIAGNOSIS — D689 Coagulation defect, unspecified: Secondary | ICD-10-CM

## 2017-12-09 DIAGNOSIS — Z01812 Encounter for preprocedural laboratory examination: Secondary | ICD-10-CM

## 2017-12-09 DIAGNOSIS — R5383 Other fatigue: Secondary | ICD-10-CM

## 2017-12-09 DIAGNOSIS — R9439 Abnormal result of other cardiovascular function study: Secondary | ICD-10-CM

## 2017-12-09 NOTE — Telephone Encounter (Signed)
-----   Message from Rollene Rotunda, MD sent at 12/09/2017  1:07 PM EDT ----- Agree he will need right and left heart cath. I discussed this with him.  The patient understands that risks included but are not limited to stroke (1 in 1000), death (1 in 1000), kidney failure [usually temporary] (1 in 500), bleeding (1 in 200), allergic reaction [possibly serious] (1 in 200).  The patient understands and agrees to proceed.   Please call him to schedule right and left heart cath.

## 2017-12-09 NOTE — Telephone Encounter (Signed)
@  [facility]  [facility] CARDIOVASCULAR DIVISION [facility] [address] [place] [address] Dept: [phone] Loc: [phone]  Raymond Welch  12/09/2017  You are scheduled for a Cardiac Catheterization on Tuesday, May 14 with Dr. Tonny Bollman.  1. Please arrive at the [facility] (Main Entrance A) at [facility]: [address] [place], [place] [address] at 6:00 AM (two hours before your procedure to ensure your preparation). Free valet parking service is available.   Special note: Every effort is made to have your procedure done on time. Please understand that emergencies sometimes delay scheduled procedures.  2. Diet: Do not eat or drink anything after midnight prior to your procedure except sips of water to take medications.  3. Labs: Monday May 13th  4. Medication instructions in preparation for your procedure:   Current Outpatient Medications (Endocrine & Metabolic):  .  metFORMIN (GLUCOPHAGE) 500 MG tablet, Take by mouth 2 (two) times daily with a meal.  Current Outpatient Medications (Cardiovascular):  .  amLODipine (NORVASC) 5 MG tablet, Take 5 mg by mouth daily. Marland Kitchen  losartan-hydrochlorothiazide (HYZAAR) 100-12.5 MG tablet, Take 1 tablet by mouth daily. .  pravastatin (PRAVACHOL) 40 MG tablet, Take 40 mg by mouth daily. .  tadalafil (CIALIS) 20 MG tablet, Take 20 mg by mouth daily as needed for erectile dysfunction.   Current Outpatient Medications (Analgesics):  .  indomethacin (INDOCIN) 50 MG capsule, Take 1 capsule (50 mg total) by mouth 3 (three) times daily with meals. Marland Kitchen  oxyCODONE-acetaminophen (PERCOCET) 10-325 MG tablet, Take 1 tablet by mouth every 6 (six) hours as needed for pain.   Current Outpatient Medications (Other):  .  glucose blood test strip, 1 each by Other route as needed for other. Use as instructed .  methocarbamol (ROBAXIN) 500 MG tablet, Take 1 tablet (500 mg total) by mouth 2 (two)  times daily as needed for muscle spasms. Letta Pate DELICA LANCETS 33G MISC, by Does not apply route. *For reference purposes while preparing patient instructions.   Delete this med list prior to printing instructions for patient.*  None  Stop taking, Glucophage (Metformin) on Monday, May 13.    None  On the morning of your procedure, take your  morning medicines  listed above.  You may use sips of water.  5. Plan for one night stay--bring personal belongings. 6. Bring a current list of your medications and current insurance cards. 7. You MUST have a responsible person to drive you home. 8. Someone MUST be with you the first 24 hours after you arrive home or your discharge will be delayed. 9. Please wear clothes that are easy to get on and off and wear slip-on shoes.  Thank you for allowing [place] to care for you!   -- [facility]

## 2017-12-12 ENCOUNTER — Telehealth: Payer: Self-pay | Admitting: *Deleted

## 2017-12-12 DIAGNOSIS — R931 Abnormal findings on diagnostic imaging of heart and coronary circulation: Secondary | ICD-10-CM | POA: Diagnosis present

## 2017-12-12 DIAGNOSIS — D689 Coagulation defect, unspecified: Secondary | ICD-10-CM | POA: Diagnosis not present

## 2017-12-12 DIAGNOSIS — Z01812 Encounter for preprocedural laboratory examination: Secondary | ICD-10-CM | POA: Diagnosis not present

## 2017-12-12 DIAGNOSIS — R9439 Abnormal result of other cardiovascular function study: Secondary | ICD-10-CM | POA: Diagnosis not present

## 2017-12-12 DIAGNOSIS — R5383 Other fatigue: Secondary | ICD-10-CM | POA: Diagnosis not present

## 2017-12-12 LAB — TSH: TSH: 2.03 u[IU]/mL (ref 0.450–4.500)

## 2017-12-12 LAB — PROTIME-INR
INR: 1.1 (ref 0.8–1.2)
Prothrombin Time: 11.3 s (ref 9.1–12.0)

## 2017-12-12 LAB — BASIC METABOLIC PANEL
BUN/Creatinine Ratio: 12 (ref 10–24)
BUN: 15 mg/dL (ref 8–27)
CO2: 22 mmol/L (ref 20–29)
Calcium: 9.8 mg/dL (ref 8.6–10.2)
Chloride: 103 mmol/L (ref 96–106)
Creatinine, Ser: 1.29 mg/dL — ABNORMAL HIGH (ref 0.76–1.27)
GFR calc Af Amer: 66 mL/min/{1.73_m2} (ref >59)
GFR calc non Af Amer: 57 mL/min/{1.73_m2} — ABNORMAL LOW (ref >59)
Glucose: 125 mg/dL — ABNORMAL HIGH (ref 65–99)
Potassium: 4 mmol/L (ref 3.5–5.2)
Sodium: 141 mmol/L (ref 134–144)

## 2017-12-12 LAB — CBC
Hematocrit: 40.3 % (ref 37.5–51.0)
Hemoglobin: 13.8 g/dL (ref 13.0–17.7)
MCH: 30.4 pg (ref 26.6–33.0)
MCHC: 34.2 g/dL (ref 31.5–35.7)
MCV: 89 fL (ref 79–97)
Platelets: 203 10*3/uL (ref 150–379)
RBC: 4.54 x10E6/uL (ref 4.14–5.80)
RDW: 13.2 % (ref 12.3–15.4)
WBC: 4.7 10*3/uL (ref 3.4–10.8)

## 2017-12-12 LAB — APTT: aPTT: 31 s (ref 24–33)

## 2017-12-12 NOTE — Telephone Encounter (Signed)
Pt contacted pre-catheterization scheduled at [facility] for: Tuesday Dec 13, 2017 8 AM Verified arrival time and place: [facility] Main Entrance A at: 6 AM  No solid food after midnight prior to cath, clear liquids until 5 AM day of procedure. Verified allergies in Epic  Hold: Losartan-HCT AM of procedure. Cialis 24 hours pre-procedure.  AM meds can be  taken pre-cath with sip of water including: ASA 81 mg    Confirmed patient has responsible person to drive home post procedure and observe patient for 24 hours: yes

## 2017-12-13 ENCOUNTER — Ambulatory Visit (HOSPITAL_COMMUNITY)
Admission: RE | Disposition: A | Discharge status: Home / Self Care | Payer: Self-pay | Source: Ambulatory Visit | Attending: Cardiovascular Disease

## 2017-12-13 ENCOUNTER — Encounter (HOSPITAL_COMMUNITY): Payer: Self-pay | Admitting: Cardiovascular Disease

## 2017-12-13 ENCOUNTER — Ambulatory Visit (HOSPITAL_COMMUNITY)
Admission: RE | Admit: 2017-12-13 | Discharge: 2017-12-13 | Disposition: A | Discharge status: Home / Self Care | Payer: Medicare Other | Source: Ambulatory Visit | Attending: Cardiovascular Disease | Admitting: Cardiovascular Disease

## 2017-12-13 DIAGNOSIS — Z8249 Family history of ischemic heart disease and other diseases of the circulatory system: Secondary | ICD-10-CM | POA: Insufficient documentation

## 2017-12-13 DIAGNOSIS — G4733 Obstructive sleep apnea (adult) (pediatric): Secondary | ICD-10-CM | POA: Insufficient documentation

## 2017-12-13 DIAGNOSIS — I2584 Coronary atherosclerosis due to calcified coronary lesion: Secondary | ICD-10-CM | POA: Insufficient documentation

## 2017-12-13 DIAGNOSIS — E7800 Pure hypercholesterolemia, unspecified: Secondary | ICD-10-CM | POA: Insufficient documentation

## 2017-12-13 DIAGNOSIS — Z833 Family history of diabetes mellitus: Secondary | ICD-10-CM | POA: Insufficient documentation

## 2017-12-13 DIAGNOSIS — I25119 Atherosclerotic heart disease of native coronary artery with unspecified angina pectoris: Secondary | ICD-10-CM | POA: Insufficient documentation

## 2017-12-13 DIAGNOSIS — Z6838 Body mass index (BMI) 38.0-38.9, adult: Secondary | ICD-10-CM | POA: Diagnosis not present

## 2017-12-13 DIAGNOSIS — I5022 Chronic systolic (congestive) heart failure: Secondary | ICD-10-CM | POA: Diagnosis not present

## 2017-12-13 DIAGNOSIS — I2510 Atherosclerotic heart disease of native coronary artery without angina pectoris: Secondary | ICD-10-CM | POA: Diagnosis not present

## 2017-12-13 DIAGNOSIS — R931 Abnormal findings on diagnostic imaging of heart and coronary circulation: Secondary | ICD-10-CM | POA: Diagnosis not present

## 2017-12-13 DIAGNOSIS — Z9119 Patient's noncompliance with other medical treatment and regimen: Secondary | ICD-10-CM | POA: Insufficient documentation

## 2017-12-13 DIAGNOSIS — Z79899 Other long term (current) drug therapy: Secondary | ICD-10-CM | POA: Insufficient documentation

## 2017-12-13 DIAGNOSIS — Z87891 Personal history of nicotine dependence: Secondary | ICD-10-CM | POA: Diagnosis not present

## 2017-12-13 DIAGNOSIS — E119 Type 2 diabetes mellitus without complications: Secondary | ICD-10-CM | POA: Diagnosis not present

## 2017-12-13 DIAGNOSIS — I110 Hypertensive heart disease with heart failure: Secondary | ICD-10-CM | POA: Insufficient documentation

## 2017-12-13 DIAGNOSIS — Z794 Long term (current) use of insulin: Secondary | ICD-10-CM | POA: Insufficient documentation

## 2017-12-13 HISTORY — PX: LEFT HEART CATH AND CORONARY ANGIOGRAPHY: CATH118249

## 2017-12-13 LAB — GLUCOSE, CAPILLARY
Glucose-Capillary: 97 mg/dL (ref 65–99)
Glucose-Capillary: 98 mg/dL (ref 65–99)

## 2017-12-13 SURGERY — LEFT HEART CATH AND CORONARY ANGIOGRAPHY
Anesthesia: LOCAL

## 2017-12-13 MED ORDER — HEPARIN SODIUM (PORCINE) 1000 UNIT/ML IJ SOLN
INTRAMUSCULAR | Status: AC
  Filled 2017-12-13: qty 1

## 2017-12-13 MED ORDER — SODIUM CHLORIDE 0.9 % IV SOLN
INTRAVENOUS | Status: DC
  Administered 2017-12-13: 09:00:00 via INTRAVENOUS

## 2017-12-13 MED ORDER — ACETAMINOPHEN 325 MG PO TABS: 650.0000 mg | ORAL_TABLET | ORAL | Status: DC | PRN

## 2017-12-13 MED ORDER — ONDANSETRON HCL 4 MG/2ML IJ SOLN: 4.0000 mg | Freq: Four times a day (QID) | INTRAMUSCULAR | Status: DC | PRN

## 2017-12-13 MED ORDER — SODIUM CHLORIDE 0.9% FLUSH: 3.0000 mL | INTRAVENOUS | Status: DC | PRN

## 2017-12-13 MED ORDER — FENTANYL CITRATE (PF) 100 MCG/2ML IJ SOLN
INTRAMUSCULAR | Status: DC | PRN
  Administered 2017-12-13: 25 ug via INTRAVENOUS

## 2017-12-13 MED ORDER — ASPIRIN 81 MG PO CHEW: 81.0000 mg | CHEWABLE_TABLET | ORAL | Status: DC

## 2017-12-13 MED ORDER — FENTANYL CITRATE (PF) 100 MCG/2ML IJ SOLN
INTRAMUSCULAR | Status: AC
  Filled 2017-12-13: qty 2

## 2017-12-13 MED ORDER — SODIUM CHLORIDE 0.9 % IV SOLN: INTRAVENOUS | Status: DC

## 2017-12-13 MED ORDER — LIDOCAINE HCL (PF) 1 % IJ SOLN
INTRAMUSCULAR | Status: DC | PRN
  Administered 2017-12-13 (×2): 2 mL

## 2017-12-13 MED ORDER — SODIUM CHLORIDE 0.9 % IV SOLN: 250.0000 mL | INTRAVENOUS | Status: DC | PRN

## 2017-12-13 MED ORDER — HEPARIN (PORCINE) IN NACL 1000-0.9 UT/500ML-% IV SOLN
INTRAVENOUS | Status: AC
  Filled 2017-12-13: qty 1000

## 2017-12-13 MED ORDER — VERAPAMIL HCL 2.5 MG/ML IV SOLN
INTRAVENOUS | Status: DC | PRN
  Administered 2017-12-13: 10 mL via INTRA_ARTERIAL

## 2017-12-13 MED ORDER — HEPARIN SODIUM (PORCINE) 1000 UNIT/ML IJ SOLN
INTRAMUSCULAR | Status: DC | PRN
  Administered 2017-12-13: 6000 [IU] via INTRAVENOUS

## 2017-12-13 MED ORDER — SODIUM CHLORIDE 0.9% FLUSH: 3.0000 mL | Freq: Two times a day (BID) | INTRAVENOUS | Status: DC

## 2017-12-13 MED ORDER — IOHEXOL 350 MG/ML SOLN
INTRAVENOUS | Status: DC | PRN
  Administered 2017-12-13: 80 mL

## 2017-12-13 MED ORDER — MIDAZOLAM HCL 2 MG/2ML IJ SOLN
INTRAMUSCULAR | Status: AC
  Filled 2017-12-13: qty 2

## 2017-12-13 MED ORDER — MIDAZOLAM HCL 2 MG/2ML IJ SOLN
INTRAMUSCULAR | Status: DC | PRN
  Administered 2017-12-13: 1 mg via INTRAVENOUS

## 2017-12-13 MED ORDER — LIDOCAINE HCL (PF) 1 % IJ SOLN
INTRAMUSCULAR | Status: AC
  Filled 2017-12-13: qty 30

## 2017-12-13 MED ORDER — VERAPAMIL HCL 2.5 MG/ML IV SOLN
INTRAVENOUS | Status: AC
  Filled 2017-12-13: qty 2

## 2017-12-13 MED ORDER — HEPARIN (PORCINE) IN NACL 2-0.9 UNITS/ML
INTRAMUSCULAR | Status: AC | PRN
  Administered 2017-12-13 (×2): 500 mL

## 2017-12-13 MED ORDER — SODIUM CHLORIDE 0.9% FLUSH
3.0000 mL | INTRAVENOUS | Status: DC | PRN
Start: 2017-12-13 — End: 2017-12-13

## 2017-12-13 SURGICAL SUPPLY — 15 items
CATH INFINITI 5 FR JL3.5 (CATHETERS) ×1 IMPLANT
CATH INFINITI 5FR ANG PIGTAIL (CATHETERS) ×1 IMPLANT
CATH INFINITI JR4 5F (CATHETERS) ×1 IMPLANT
COVER PRB 48X5XTLSCP FOLD TPE (BAG) IMPLANT
COVER PROBE 5X48 (BAG) ×2
DEVICE RAD COMP TR BAND LRG (VASCULAR PRODUCTS) ×1 IMPLANT
GLIDESHEATH SLEND SS 6F .021 (SHEATH) ×1 IMPLANT
GUIDEWIRE INQWIRE 1.5J.035X260 (WIRE) IMPLANT
INQWIRE 1.5J .035X260CM (WIRE) ×2
KIT HEART LEFT (KITS) ×2 IMPLANT
PACK CARDIAC CATHETERIZATION (CUSTOM PROCEDURE TRAY) ×2 IMPLANT
SHEATH GLIDE SLENDER 4/5FR (SHEATH) ×1 IMPLANT
SYR MEDRAD MARK V 150ML (SYRINGE) ×2 IMPLANT
TRANSDUCER W/STOPCOCK (MISCELLANEOUS) ×2 IMPLANT
TUBING CIL FLEX 10 FLL-RA (TUBING) ×2 IMPLANT

## 2017-12-13 NOTE — Discharge Instructions (Signed)
Drink plenty of fluids over next 48 hours and keep right wrist elevated at heart level for 24 hours ° °Radial Site Care °Refer to this sheet in the next few weeks. These instructions provide you with information about caring for yourself after your procedure. Your health care provider may also give you more specific instructions. Your treatment has been planned according to current medical practices, but problems sometimes occur. Call your health care provider if you have any problems or questions after your procedure. °What can I expect after the procedure? °After your procedure, it is typical to have the following: °· Bruising at the radial site that usually fades within 1-2 weeks. °· Blood collecting in the tissue (hematoma) that may be painful to the touch. It should usually decrease in size and tenderness within 1-2 weeks. ° °Follow these instructions at home: °· Take medicines only as directed by your health care provider. °· You may shower 24-48 hours after the procedure or as directed by your health care provider. Remove the bandage (dressing) and gently wash the site with plain soap and water. Pat the area dry with a clean towel. Do not rub the site, because this may cause bleeding. °· Do not take baths, swim, or use a hot tub until your health care provider approves. °· Check your insertion site every day for redness, swelling, or drainage. °· Do not apply powder or lotion to the site. °· Do not flex or bend the affected arm for 24 hours or as directed by your health care provider. °· Do not push or pull heavy objects with the affected arm for 24 hours or as directed by your health care provider. °· Do not lift over 10 lb (4.5 kg) for 5 days after your procedure or as directed by your health care provider. °· Ask your health care provider when it is okay to: °? Return to work or school. °? Resume usual physical activities or sports. °? Resume sexual activity. °· Do not drive home if you are discharged the  same day as the procedure. Have someone else drive you. °· You may drive 24 hours after the procedure unless otherwise instructed by your health care provider. °· Do not operate machinery or power tools for 24 hours after the procedure. °· If your procedure was done as an outpatient procedure, which means that you went home the same day as your procedure, a responsible adult should be with you for the first 24 hours after you arrive home. °· Keep all follow-up visits as directed by your health care provider. This is important. °Contact a health care provider if: °· You have a fever. °· You have chills. °· You have increased bleeding from the radial site. Hold pressure on the site. °Get help right away if: °· You have unusual pain at the radial site. °· You have redness, warmth, or swelling at the radial site. °· You have drainage (other than a small amount of blood on the dressing) from the radial site. °· The radial site is bleeding, and the bleeding does not stop after 30 minutes of holding steady pressure on the site. °· Your arm or hand becomes pale, cool, tingly, or numb. °This information is not intended to replace advice given to you by your health care provider. Make sure you discuss any questions you have with your health care provider. °Document Released: 08/21/2010 Document Revised: 12/25/2015 Document Reviewed: 02/04/2014 °Elsevier Interactive Patient Education © 2018 Elsevier Inc. ° °

## 2017-12-13 NOTE — Interval H&P Note (Signed)
History and Physical Interval Note:  12/13/2017 8:37 AM  Raymond Welch  has presented today for surgery, with the diagnosis of abnormal stress test  The various methods of treatment have been discussed with the patient and family. After consideration of risks, benefits and other options for treatment, the patient has consented to  Procedure(s): RIGHT/LEFT HEART CATH AND CORONARY ANGIOGRAPHY (N/A) as a surgical intervention .  The patient's history has been reviewed, patient examined, no change in status, stable for surgery.  I have reviewed the patient's chart and labs.  Questions were answered to the patient's satisfaction.     Tonny Bollman

## 2017-12-14 ENCOUNTER — Other Ambulatory Visit: Payer: Self-pay | Admitting: Cardiology

## 2017-12-14 ENCOUNTER — Telehealth: Payer: Self-pay

## 2017-12-14 DIAGNOSIS — R079 Chest pain, unspecified: Secondary | ICD-10-CM

## 2017-12-14 MED FILL — Heparin Sod (Porcine)-NaCl IV Soln 1000 Unit/500ML-0.9%: INTRAVENOUS | Qty: 1000 | Status: AC

## 2017-12-14 NOTE — Telephone Encounter (Signed)
Abelino Derrick, PA-C  Parke Poisson, RN        Can you please let Mr Mendonsa know we are going to get an echocardiogram on him to look at the pressures in his heart and his heart function. (I have ordered)   Thanks,   [employer] and informed of Corine Shelter, Pa recommendation for ECHO. Pt verbalized understand. Staff message sent to [facility] for scheduling.

## 2017-12-16 ENCOUNTER — Other Ambulatory Visit: Payer: Self-pay

## 2017-12-16 ENCOUNTER — Ambulatory Visit (HOSPITAL_COMMUNITY): Payer: Medicare Other | Attending: Cardiovascular Disease

## 2017-12-16 DIAGNOSIS — E7800 Pure hypercholesterolemia, unspecified: Secondary | ICD-10-CM | POA: Insufficient documentation

## 2017-12-16 DIAGNOSIS — Z6839 Body mass index (BMI) 39.0-39.9, adult: Secondary | ICD-10-CM | POA: Insufficient documentation

## 2017-12-16 DIAGNOSIS — I77810 Thoracic aortic ectasia: Secondary | ICD-10-CM | POA: Insufficient documentation

## 2017-12-16 DIAGNOSIS — I10 Essential (primary) hypertension: Secondary | ICD-10-CM | POA: Insufficient documentation

## 2017-12-16 DIAGNOSIS — E119 Type 2 diabetes mellitus without complications: Secondary | ICD-10-CM | POA: Insufficient documentation

## 2017-12-16 DIAGNOSIS — E669 Obesity, unspecified: Secondary | ICD-10-CM | POA: Insufficient documentation

## 2017-12-16 DIAGNOSIS — R079 Chest pain, unspecified: Secondary | ICD-10-CM | POA: Insufficient documentation

## 2017-12-16 DIAGNOSIS — G4730 Sleep apnea, unspecified: Secondary | ICD-10-CM | POA: Diagnosis not present

## 2017-12-20 DIAGNOSIS — I129 Hypertensive chronic kidney disease with stage 1 through stage 4 chronic kidney disease, or unspecified chronic kidney disease: Secondary | ICD-10-CM | POA: Diagnosis not present

## 2017-12-20 DIAGNOSIS — E1122 Type 2 diabetes mellitus with diabetic chronic kidney disease: Secondary | ICD-10-CM | POA: Diagnosis not present

## 2018-01-02 DIAGNOSIS — M961 Postlaminectomy syndrome, not elsewhere classified: Secondary | ICD-10-CM | POA: Diagnosis not present

## 2018-01-02 DIAGNOSIS — M545 Low back pain: Secondary | ICD-10-CM | POA: Diagnosis not present

## 2018-02-16 NOTE — Progress Notes (Signed)
Cardiology Office Note   Date:  02/17/2018   ID:  Raymond Crampony Piccininni, DOB 1951/05/10, MRN [id number]  PCP:  Dorothyann PengSanders, Robyn, MD  Cardiologist:   Rollene RotundaJames Ashika Apuzzo, MD    Chief Complaint  Patient presents with  . Cardiomyopathy      History of Present Illness: Raymond Welch is a 67 y.o. male who is referred by Dorothyann PengSanders, Robyn, MD for evaluation of chest pain. We saw him in May 2018 for chest pain, he had a negative POET.   However he presented this year with continued pain and DOE.    Cath demonstrated mild diffuse CAD.  However, he does have a mildly reduced EF of 45%.    Since he was last seen he has been doing well.  He does some yard work.  With this level of activity he denies any cardiovascular symptoms.  He does not report shortness of breath, PND or orthopnea.  He does not have palpitations, presyncope or syncope.  He seems to feel well.   He has no weight gain or edema.    Past Medical History:  Diagnosis Date  . Hypertension   . Low back pain   . Pure hypercholesterolemia   . Sleep apnea    Doesn't uses CPAP "like he is supposed to"  . Type 2 diabetes mellitus (HCC)     Past Surgical History:  Procedure Laterality Date  . BACK SURGERY    . LEFT HEART CATH AND CORONARY ANGIOGRAPHY N/A 12/13/2017   Procedure: LEFT HEART CATH AND CORONARY ANGIOGRAPHY;  Surgeon: Tonny Bollmanooper, Michael, MD;  [facility];  Service: Cardiovascular;  Laterality: N/A;     Current Outpatient Medications  Medication Sig Dispense Refill  . Cholecalciferol (VITAMIN D3) 1000 units CAPS Take 1,000 Units by mouth daily.    . cyclobenzaprine (FLEXERIL) 10 MG tablet Take 10 mg by mouth at bedtime as needed for muscle pain.  2  . glucose blood test strip 1 each by Other route daily as needed for other (blood sugar). Use as instructed     . losartan-hydrochlorothiazide (HYZAAR) 100-12.5 MG tablet Take 1 tablet by mouth daily.  2  . methocarbamol (ROBAXIN) 500 MG tablet Take 1 tablet (500 mg total) by  mouth 2 (two) times daily as needed for muscle spasms. 20 tablet 0  . Olopatadine HCl (PAZEO) 0.7 % SOLN Place 1 drop into both eyes 2 (two) times daily.    Letta Pate. ONETOUCH DELICA LANCETS 33G MISC 1 each by Does not apply route daily as needed (blood sugar).     Marland Kitchen. oxyCODONE-acetaminophen (PERCOCET) 10-325 MG tablet Take 1 tablet by mouth every 8 (eight) hours as needed for pain.     Marland Kitchen. OZEMPIC 0.25 or 0.5 MG/DOSE SOPN Inject 0.5 mg into the skin every 7 (seven) days.  3  . pravastatin (PRAVACHOL) 40 MG tablet Take 40 mg by mouth daily.  2  . tadalafil (CIALIS) 20 MG tablet Take 20 mg by mouth daily as needed for erectile dysfunction.    . TRULICITY 0.75 MG/0.5ML SOPN INJECT 0.5 MLS SQ EVERY WEEK IN ABDOMEN,THIGH,OR UPPER ARM ROTATE SITES  2  . metoprolol succinate (TOPROL-XL) 50 MG 24 hr tablet Take 1 tablet (50 mg total) by mouth daily. Take with or immediately following a meal. 90 tablet 3   No current facility-administered medications for this visit.     Allergies:   Patient has no known allergies.    ROS:  Please see the history of present illness.  Otherwise, review of systems are positive for none.   All other systems are reviewed and negative.    PHYSICAL EXAM: VS:  BP 130/79   Pulse 75   Ht 6\' 2"  (1.88 m)   Wt (!) 303 lb (137.4 kg)   SpO2 95%   BMI 38.90 kg/m  , BMI Body mass index is 38.9 kg/m.  GENERAL:  Well appearing NECK:  No jugular venous distention, waveform within normal limits, carotid upstroke brisk and symmetric, no bruits, no thyromegaly LUNGS:  Clear to auscultation bilaterally CHEST:  Unremarkable HEART:  PMI not displaced or sustained,S1 and S2 within normal limits, no S3, no S4, no clicks, no rubs, no murmurs ABD:  Flat, positive bowel sounds normal in frequency in pitch, no bruits, no rebound, no guarding, no midline pulsatile mass, no hepatomegaly, no splenomegaly, obese EXT:  2 plus pulses throughout, no edema, no cyanosis no clubbing   EKG:  EKG is not  ordered today.    Recent Labs: 07/26/2017: ALT 20 12/12/2017: BUN 15; Creatinine, Ser 1.29; Hemoglobin 13.8; Platelets 203; Potassium 4.0; Sodium 141; TSH 2.030    Lipid Panel No results found for: CHOL, TRIG, HDL, CHOLHDL, VLDL, LDLCALC, LDLDIRECT    Wt Readings from Last 3 Encounters:  02/17/18 (!) 303 lb (137.4 kg)  12/13/17 (!) 304 lb (137.9 kg)  12/07/17 300 lb (136.1 kg)      Other studies Reviewed: Additional studies/ records that were reviewed today include:  Echo, cath.   Review of the above records demonstrates:  See below.     ASSESSMENT AND PLAN:   CHEST PAIN:  He had mild coronary palque.    No change in therapy.  Continue with primary risk reduction.    DM:   His A1c was most recently 5.8.  This is managed by Dorothyann Peng, MD  DYSLIPIDEMIA:  His LDL was 70 in 2018.  No change in therapy.   MORBID OBESITY:    I will refer him to her Health Coach Program to explore his individual barriers to a healthy weight.   CARDIOMYOPATHY: I will switch him from Norvasc to metoprolol to manage his nonischemic cardiomyopathy.  The only precautions would be masking hypoglycemic symptoms.  I will consider titrating this based on blood pressures in the future.  We will follow-up with echocardiograms in the future.  For now he has no significant symptoms. Current medicines are reviewed at length with the patient today.  The patient NO concerns regarding medicines.  The following changes have been made:  As above  Labs/ tests ordered today include:  None  No orders of the defined types were placed in this encounter.    Disposition:   FU with me in 4 months.    Signed, Rollene Rotunda, MD  02/17/2018 9:03 AM    [facility]

## 2018-02-17 ENCOUNTER — Ambulatory Visit (INDEPENDENT_AMBULATORY_CARE_PROVIDER_SITE_OTHER): Payer: Medicare Other | Admitting: Cardiology

## 2018-02-17 ENCOUNTER — Encounter: Payer: Self-pay | Admitting: Cardiology

## 2018-02-17 VITALS — BP 130/79 | HR 75 | Ht 74.0 in | Wt 303.0 lb

## 2018-02-17 DIAGNOSIS — E785 Hyperlipidemia, unspecified: Secondary | ICD-10-CM | POA: Diagnosis not present

## 2018-02-17 DIAGNOSIS — I420 Dilated cardiomyopathy: Secondary | ICD-10-CM | POA: Diagnosis not present

## 2018-02-17 DIAGNOSIS — I10 Essential (primary) hypertension: Secondary | ICD-10-CM | POA: Diagnosis not present

## 2018-02-17 MED ORDER — METOPROLOL SUCCINATE ER 50 MG PO TB24: 50.0000 mg | ORAL_TABLET | Freq: Every day | ORAL | 3 refills | Status: DC

## 2018-02-17 NOTE — Patient Instructions (Signed)
Medication Instructions:  STOP- Amlodipine  START- Metoprolol XL 50 mg daily  If you need a refill on your cardiac medications before your next appointment, please call your pharmacy.  Labwork: None Ordered   Testing/Procedures: None Ordered   Follow-Up: Your physician wants you to follow-up in: 3 Months.     Thank you for choosing [facility]!!

## 2018-02-23 ENCOUNTER — Telehealth: Payer: Self-pay

## 2018-02-23 NOTE — Telephone Encounter (Signed)
Called to schedule visit. Pt says he would need to look at his calendar. Will call back.
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
## 2018-05-21 NOTE — Progress Notes (Signed)
Cardiology Office Note   Date:  05/22/2018   ID:  Raymond Welch, DOB 06/08/1951, MRN [id number]  PCP:  Glendale Chard, MD  Cardiologist:   Minus Breeding, MD    Chief Complaint  Patient presents with  . Chest Pain      History of Present Illness: Raymond Welch is a 67 y.o. male who is referred by Glendale Chard, MD for evaluation of chest pain. We saw him in May 2018 for chest pain, he had a negative POET.   However he presented this year with continued pain and DOE.    Cath demonstrated mild diffuse CAD.  However, he does have a mildly reduced EF of 45%.    At the last visit I stopped amlodipine and increase beta-blocker to better manage the mildly reduced ejection fraction.  He is done well with this. The patient denies any new symptoms such as chest discomfort, neck or arm discomfort. There has been no new shortness of breath, PND or orthopnea. There have been no reported palpitations, presyncope or syncope.  He says that he is doing a little more activity and he is walking up and down the alley behind his house.  He is doing some weights.  He denies any cardiovascular symptoms.  Past Medical History:  Diagnosis Date  . Hypertension   . Low back pain   . Pure hypercholesterolemia   . Sleep apnea    Doesn't uses CPAP "like he is supposed to"  . Type 2 diabetes mellitus ([place])     Past Surgical History:  Procedure Laterality Date  . BACK SURGERY    . LEFT HEART CATH AND CORONARY ANGIOGRAPHY N/A 12/13/2017   Procedure: LEFT HEART CATH AND CORONARY ANGIOGRAPHY;  Surgeon: Sherren Mocha, MD;  Location: [facility];  Service: Cardiovascular;  Laterality: N/A;     Current Outpatient Medications  Medication Sig Dispense Refill  . Cholecalciferol (VITAMIN D3) 1000 units CAPS Take 1,000 Units by mouth daily.    . cyclobenzaprine (FLEXERIL) 10 MG tablet Take 10 mg by mouth at bedtime as needed for muscle pain.  2  . glucose blood test strip 1 each by Other route daily as  needed for other (blood sugar). Use as instructed     . losartan-hydrochlorothiazide (HYZAAR) 100-12.5 MG tablet Take 1 tablet by mouth daily.  2  . methocarbamol (ROBAXIN) 500 MG tablet Take 1 tablet (500 mg total) by mouth 2 (two) times daily as needed for muscle spasms. 20 tablet 0  . metoprolol succinate (TOPROL-XL) 50 MG 24 hr tablet Take 50 mg by mouth daily. Take with or immediately following a meal.    . Olopatadine HCl (PAZEO) 0.7 % SOLN Place 1 drop into both eyes 2 (two) times daily.    Glory Rosebush DELICA LANCETS 01E MISC 1 each by Does not apply route daily as needed (blood sugar).     Marland Kitchen oxyCODONE-acetaminophen (PERCOCET) 10-325 MG tablet Take 1 tablet by mouth every 8 (eight) hours as needed for pain.     Marland Kitchen OZEMPIC 0.25 or 0.5 MG/DOSE SOPN Inject 0.5 mg into the skin every 7 (seven) days.  3  . pravastatin (PRAVACHOL) 40 MG tablet Take 40 mg by mouth daily.  2  . tadalafil (CIALIS) 20 MG tablet Take 20 mg by mouth daily as needed for erectile dysfunction.    . TRULICITY 0.71 QR/9.7JO SOPN INJECT 0.5 MLS SQ EVERY WEEK IN ABDOMEN,THIGH,OR UPPER ARM ROTATE SITES  2   No current facility-administered medications  for this visit.     Allergies:   Patient has no known allergies.    ROS:  Please see the history of present illness.   Otherwise, review of systems are positive for none.   All other systems are reviewed and negative.    PHYSICAL EXAM: VS:  BP 110/86   Pulse 84   Ht '6\' 2"'  (1.88 m)   Wt (!) 302 lb 9.6 oz (137.3 kg)   SpO2 92%   BMI 38.85 kg/m  , BMI Body mass index is 38.85 kg/m.  GENERAL:  Well appearing NECK:  No jugular venous distention, waveform within normal limits, carotid upstroke brisk and symmetric, no bruits, no thyromegaly LUNGS:  Clear to auscultation bilaterally CHEST:  Unremarkable HEART:  PMI not displaced or sustained,S1 and S2 within normal limits, no S3, no S4, no clicks, no rubs, no murmurs ABD:  Flat, positive bowel sounds normal in frequency in  pitch, no bruits, no rebound, no guarding, no midline pulsatile mass, no hepatomegaly, no splenomegaly EXT:  2 plus pulses throughout, no edema, no cyanosis no clubbing   EKG:  EKG is not ordered today.    Recent Labs: 07/26/2017: ALT 20 12/12/2017: BUN 15; Creatinine, Ser 1.29; Hemoglobin 13.8; Platelets 203; Potassium 4.0; Sodium 141; TSH 2.030    Lipid Panel No results found for: CHOL, TRIG, HDL, CHOLHDL, VLDL, LDLCALC, LDLDIRECT    Wt Readings from Last 3 Encounters:  05/22/18 (!) 302 lb 9.6 oz (137.3 kg)  02/17/18 (!) 303 lb (137.4 kg)  12/13/17 (!) 304 lb (137.9 kg)      Other studies Reviewed: Additional studies/ records that were reviewed today include: Labs Review of the above records demonstrates:     ASSESSMENT AND PLAN:  CARDIOMYOPATHY: For now and then leave the meds with ER.  He understands volume management and salt restriction.  MORBID OBESITY:   I referred him to a Engineer, maintenance.  He says he met with her but I do not see the documentation and will check into this.  He is been educated about diet and exercise.  SLEEP APNEA:   He has not been using his CPAP adequately.  He is having symptoms and tiredness and documented sleep apnea.  He needs a repeat sleep study and then follow-up with our sleep physicians.    Current medicines are reviewed at length with the patient today.  The patient NO concerns regarding medicines.  The following changes have been made:  None  Labs/ tests ordered today include:  None  Orders Placed This Encounter  Procedures  . Split night study     Disposition:   FU with me in 12 months.    Signed, Minus Breeding, MD  05/22/2018 8:59 AM    [facility]

## 2018-05-22 ENCOUNTER — Ambulatory Visit (INDEPENDENT_AMBULATORY_CARE_PROVIDER_SITE_OTHER): Payer: Medicare Other | Admitting: Cardiology

## 2018-05-22 ENCOUNTER — Telehealth: Payer: Self-pay

## 2018-05-22 ENCOUNTER — Encounter: Payer: Self-pay | Admitting: Cardiology

## 2018-05-22 VITALS — BP 110/86 | HR 84 | Ht 74.0 in | Wt 302.6 lb

## 2018-05-22 DIAGNOSIS — R079 Chest pain, unspecified: Secondary | ICD-10-CM | POA: Diagnosis not present

## 2018-05-22 DIAGNOSIS — G4733 Obstructive sleep apnea (adult) (pediatric): Secondary | ICD-10-CM

## 2018-05-22 NOTE — Patient Instructions (Addendum)
Medication Instructions:  Continue current medications  If you need a refill on your cardiac medications before your next appointment, please call your pharmacy.  Labwork: None Ordered   If you have labs (blood work) drawn today and your tests are completely normal, you will receive your results only by: Marland Kitchen MyChart Message (if you have MyChart) OR . A paper copy in the mail If you have any lab test that is abnormal or we need to change your treatment, we will call you to review the results.  Testing/Procedures: Your physician has recommended that you have a sleep study. This test records several body functions during sleep, including: brain activity, eye movement, oxygen and carbon dioxide blood levels, heart rate and rhythm, breathing rate and rhythm, the flow of air through your mouth and nose, snoring, body muscle movements, and chest and belly movement.   Follow-Up: . You will need a follow up appointment in 1 Year with Dr Antoine Poche    At [facility], you and your health needs are our priority.  As part of our continuing mission to provide you with exceptional heart care, we have created designated Provider Care Teams.  These Care Teams include your primary Cardiologist (physician) and Advanced Practice Providers (APPs -  Physician Assistants and Nurse Practitioners) who all work together to provide you with the care you need, when you need it.   Thank you for choosing [facility] at [facility]!!

## 2018-05-25 ENCOUNTER — Other Ambulatory Visit: Payer: Self-pay | Admitting: Internal Medicine

## 2018-05-30 NOTE — Telephone Encounter (Signed)
Called to schedule initial visit. Scheduled for 10/31 at 11

## 2018-05-31 ENCOUNTER — Telehealth: Payer: Self-pay | Admitting: *Deleted

## 2018-05-31 NOTE — Telephone Encounter (Signed)
Patient notified of sleep study scheduled 07/13/18 @ [facility].

## 2018-05-31 NOTE — Telephone Encounter (Signed)
-----   Message from Silver Huguenin sent at 05/22/2018  9:00 AM EDT ----- Regarding: sleep study Please precert and schedule. Thanks

## 2018-06-01 ENCOUNTER — Ambulatory Visit (INDEPENDENT_AMBULATORY_CARE_PROVIDER_SITE_OTHER): Payer: Medicare Other

## 2018-06-01 DIAGNOSIS — Z0000 Encounter for general adult medical examination without abnormal findings: Secondary | ICD-10-CM

## 2018-06-06 ENCOUNTER — Encounter: Payer: Self-pay | Admitting: Internal Medicine

## 2018-06-06 ENCOUNTER — Other Ambulatory Visit: Payer: Self-pay

## 2018-06-06 ENCOUNTER — Telehealth: Payer: Self-pay

## 2018-06-06 ENCOUNTER — Ambulatory Visit (INDEPENDENT_AMBULATORY_CARE_PROVIDER_SITE_OTHER): Payer: Medicare Other | Admitting: Internal Medicine

## 2018-06-06 VITALS — BP 116/78 | HR 78 | Temp 98.0°F | Ht 74.0 in | Wt 303.2 lb

## 2018-06-06 DIAGNOSIS — L03019 Cellulitis of unspecified finger: Secondary | ICD-10-CM | POA: Diagnosis not present

## 2018-06-06 DIAGNOSIS — Z8614 Personal history of Methicillin resistant Staphylococcus aureus infection: Secondary | ICD-10-CM | POA: Diagnosis not present

## 2018-06-06 MED ORDER — LOSARTAN POTASSIUM-HCTZ 100-12.5 MG PO TABS: 1.0000 | ORAL_TABLET | Freq: Every day | ORAL | 0 refills | Status: DC

## 2018-06-06 MED ORDER — DOXYCYCLINE HYCLATE 100 MG PO TABS: 100.0000 mg | ORAL_TABLET | Freq: Two times a day (BID) | ORAL | 0 refills | Status: DC

## 2018-06-06 NOTE — Progress Notes (Signed)
Week: 1  Progress Notes: Pt discussed current eating practices, has already began implementing way to decrease fried foods. Has recognized that portion size is where he would like to start his goal setting.   Challenges: Pt is on limited budget and has limited access to fresh produce.  Opportunities: Pt brought wife who is aiding in health goals.   Client Commitment/Agreement for Next Session:  Phone call set up for following week for update on food access resource

## 2018-06-06 NOTE — Progress Notes (Signed)
Subjective:     Patient ID: Raymond Welch , male    DOB: Dec 17, 1950 , 67 y.o.   MRN: [id number]   Chief Complaint  Patient presents with  . Edema    R thumb, bursted but continues to be swollen     HPI Pt is here due to having sore R thumb 4 days ago and gradually been getting sore and this am he drained puss out of it. Has been soaking it in warm water. Has hx of MRSA.    Past Medical History:  Diagnosis Date  . Hypertension   . Low back pain   . Pure hypercholesterolemia   . Sleep apnea    Doesn't uses CPAP "like he is supposed to"  . Type 2 diabetes mellitus (HCC)      Family History  Problem Relation Age of Onset  . Diabetes Mother   . Heart attack Mother 28       Died age 41  . Aneurysm Father 46  . Lung cancer Brother      Current Outpatient Medications:  .  Cholecalciferol (VITAMIN D3) 1000 units CAPS, Take 1,000 Units by mouth daily., Disp: , Rfl:  .  cyclobenzaprine (FLEXERIL) 10 MG tablet, Take 10 mg by mouth at bedtime as needed for muscle pain., Disp: , Rfl: 2 .  glucose blood test strip, 1 each by Other route daily as needed for other (blood sugar). Use as instructed , Disp: , Rfl:  .  losartan-hydrochlorothiazide (HYZAAR) 100-12.5 MG tablet, Take 1 tablet by mouth daily., Disp: 90 tablet, Rfl: 0 .  methocarbamol (ROBAXIN) 500 MG tablet, Take 1 tablet (500 mg total) by mouth 2 (two) times daily as needed for muscle spasms., Disp: 20 tablet, Rfl: 0 .  metoprolol succinate (TOPROL-XL) 50 MG 24 hr tablet, Take 50 mg by mouth daily. Take with or immediately following a meal., Disp: , Rfl:  .  Olopatadine HCl (PAZEO) 0.7 % SOLN, Place 1 drop into both eyes 2 (two) times daily., Disp: , Rfl:  .  ONETOUCH DELICA LANCETS 33G MISC, 1 each by Does not apply route daily as needed (blood sugar). , Disp: , Rfl:  .  oxyCODONE-acetaminophen (PERCOCET) 10-325 MG tablet, Take 1 tablet by mouth every 8 (eight) hours as needed for pain. , Disp: , Rfl:  .  OZEMPIC, 0.25 OR 0.5  MG/DOSE, 2 MG/1.5ML SOPN, INJECT 0.5 MG SQ ON THE SAME DAY EACH WEEK IN ABDOMEN/THIGH/UPPER ARM,ROTATE SITES, Disp: 1 pen, Rfl: 1 .  pravastatin (PRAVACHOL) 40 MG tablet, Take 40 mg by mouth daily., Disp: , Rfl: 2 .  tadalafil (CIALIS) 20 MG tablet, Take 20 mg by mouth daily as needed for erectile dysfunction., Disp: , Rfl:  .  TRULICITY 0.75 MG/0.5ML SOPN, INJECT 0.5 MLS SQ EVERY WEEK IN ABDOMEN,THIGH,OR UPPER ARM ROTATE SITES, Disp: , Rfl: 2 .  doxycycline (VIBRA-TABS) 100 MG tablet, Take 1 tablet (100 mg total) by mouth 2 (two) times daily., Disp: 20 tablet, Rfl: 0   No Known Allergies   Review of Systems  Constitutional: Negative for chills, diaphoresis and fever.  Musculoskeletal: Negative for joint swelling and myalgias.  Skin: Positive for wound. Negative for rash.       See HPI  Neurological: Negative for numbness.     Today's Vitals   06/06/18 1512  BP: 116/78  Pulse: 78  Temp: 98 F (36.7 C)  TempSrc: Oral  SpO2: 93%  Weight: (!) 303 lb 3.2 oz (137.5 kg)  Height: 6\' 2"  (  1.88 m)   Body mass index is 38.93 kg/m.   Objective:  Physical Exam  Constitutional: He is oriented to person, place, and time. He appears well-developed and well-nourished.  HENT:  Head: Normocephalic.  Right Ear: External ear normal.  Left Ear: External ear normal.  Nose: Nose normal.  Eyes: Conjunctivae are normal. No scleral icterus.  Neck: Neck supple.  Pulmonary/Chest: Effort normal.  Musculoskeletal: Normal range of motion. He exhibits tenderness. He exhibits no edema.  R thumb with tenderness on medial distal region at the border of the nail, has redness and swelling, but no active  Draining right now to  do a culture.   Neurological: He is alert and oriented to person, place, and time.  Skin: Skin is warm and dry. No rash noted.  Psychiatric: He has a normal mood and affect. His behavior is normal. Judgment and thought content normal.  Vitals reviewed.   Assessment And Plan:     1.  Felon of finger- acute. I placed him on Doxy 100 mg bid x 10 days. Advised to soak finger a few times a day on Epson salts.   Gini Caputo RODRIGUEZ-SOUTHWORTH, PA-C

## 2018-06-06 NOTE — Patient Instructions (Signed)
Celulitis en los adultos (Cellulitis, Adult) La celulitis es una infeccin de la piel. La zona infectada generalmente est de color rojo y causa dolor. Esta afeccin ocurre con ms frecuencia en los brazos y en las piernas. Es importante realizar un tratamiento para esta afeccin. CUIDADOS EN EL HOGAR  Tome los medicamentos de venta libre y los recetados solamente como se lo haya indicado el mdico.  Si le recetaron un antibitico, tmelo como se lo haya indicado el mdico. No deje de tomar los antibiticos aunque comience a sentirse mejor.  Beba suficiente lquido para mantener el pis (orina) claro o de color amarillo plido.  No toque ni frote la zona infectada.  Cuando est sentado o acostado, levante (eleve) la zona infectada por encima del nivel del corazn.  Colquese paos hmedos tibios o fros (compresas tibias o fras) sobre la zona infectada. Hgalo como se lo haya indicado el mdico.  Concurra a todas las visitas de control como se lo haya indicado el mdico. Esto es importante. Estas visitas le permiten al mdico asegurarse de que la infeccin no est empeorando. SOLICITE AYUDA SI:  Tiene fiebre.  Los sntomas no mejoran despus de 1 o 2das de tratamiento.  El hueso o la articulacin que se encuentran debajo de la zona infectada empiezan a dolerle despus de que la piel se cura.  La infeccin regresa. Esto puede ocurrir en la misma zona o en otra.  Tiene un bulto hinchado en la zona infectada.  Aparecen nuevos sntomas.  Se siente enfermo y tambin tiene dolores musculares. SOLICITE AYUDA DE INMEDIATO SI:  Los sntomas empeoran.  Se siente muy somnoliento.  Devuelve vomita o tiene deposiciones acuosas (diarrea).  Tiene lneas rojas que salen desde la zona infectada.  La zona de color rojo se agranda.  La zona de color rojo se oscurece. Esta informacin no tiene como fin reemplazar el consejo del mdico. Asegrese de hacerle al mdico cualquier pregunta que  tenga. Document Released: 01/06/2010 Document Revised: 11/10/2015 Document Reviewed: 05/28/2015 Elsevier Interactive Patient Education  2018 Elsevier Inc.  

## 2018-06-06 NOTE — Telephone Encounter (Signed)
Patient's wife called requesting a refill of Losartan. Rx has been sent to the pharmacy pt notified [facility]

## 2018-06-08 ENCOUNTER — Emergency Department (HOSPITAL_COMMUNITY)
Admission: EM | Admit: 2018-06-08 | Discharge: 2018-06-08 | Disposition: A | Discharge status: Home / Self Care | Payer: Medicare Other | Attending: Emergency Medicine | Admitting: Emergency Medicine

## 2018-06-08 ENCOUNTER — Other Ambulatory Visit: Payer: Self-pay

## 2018-06-08 ENCOUNTER — Emergency Department (HOSPITAL_COMMUNITY): Payer: Medicare Other

## 2018-06-08 ENCOUNTER — Encounter (HOSPITAL_COMMUNITY): Payer: Self-pay | Admitting: Emergency Medicine

## 2018-06-08 ENCOUNTER — Other Ambulatory Visit: Payer: Self-pay | Admitting: Internal Medicine

## 2018-06-08 DIAGNOSIS — M79672 Pain in left foot: Secondary | ICD-10-CM | POA: Diagnosis present

## 2018-06-08 DIAGNOSIS — Z87891 Personal history of nicotine dependence: Secondary | ICD-10-CM | POA: Insufficient documentation

## 2018-06-08 DIAGNOSIS — Z79899 Other long term (current) drug therapy: Secondary | ICD-10-CM | POA: Insufficient documentation

## 2018-06-08 DIAGNOSIS — I10 Essential (primary) hypertension: Secondary | ICD-10-CM | POA: Diagnosis not present

## 2018-06-08 DIAGNOSIS — E119 Type 2 diabetes mellitus without complications: Secondary | ICD-10-CM | POA: Insufficient documentation

## 2018-06-08 MED ORDER — ACETAMINOPHEN 500 MG PO TABS
1000.0000 mg | ORAL_TABLET | Freq: Once | ORAL | Status: AC
  Administered 2018-06-08: 1000 mg via ORAL
  Filled 2018-06-08: qty 2

## 2018-06-08 MED ORDER — LOSARTAN POTASSIUM-HCTZ 100-12.5 MG PO TABS: 1.0000 | ORAL_TABLET | Freq: Every day | ORAL | 0 refills | Status: DC

## 2018-06-08 NOTE — ED Triage Notes (Signed)
PT has a RX for HTN  At pharmacy.

## 2018-06-08 NOTE — ED Notes (Signed)
Pt transported to XR.  

## 2018-06-08 NOTE — ED Triage Notes (Signed)
Rt foot pain since last night denies any injury , has hx of gout jhurts to walk on it , pt arrives with crutches

## 2018-06-08 NOTE — ED Provider Notes (Signed)
[facility] EMERGENCY DEPARTMENT Provider Note   CSN: [id number] Arrival date & time: 06/08/18  1219     History   Chief Complaint Chief Complaint  Patient presents with  . Foot Pain    HPI Raymond Welch is a 67 y.o. male.  67 year old male with past medical history including type 2 diabetes mellitus, OSA, hypertension, hyperlipidemia, gout who presents with right foot pain.  Last night, he had a gradual onset of progressively worsening right dorsal foot pain.  He states that it feels like his arch of his foot fell down.  He denies any trauma, twisting, or increased physical activity prior to onset of pain.  Pain is worse when he tries to walk on it and he has had to use crutches.  No medications prior to arrival.  His significant other did apply muscle cream to his foot and he applied an Ace wrap prior to arrival.  He has history of gout that usually affects his great toe.  The history is provided by the patient.  Foot Pain     Past Medical History:  Diagnosis Date  . Hypertension   . Low back pain   . Pure hypercholesterolemia   . Sleep apnea    Doesn't uses CPAP "like he is supposed to"  . Type 2 diabetes mellitus [facility]     Patient Active Problem List   Diagnosis Date Noted  . Dilated cardiomyopathy (HCC) 02/17/2018  . Abnormal nuclear cardiac imaging test 12/12/2017  . Essential hypertension 11/30/2017  . Non-insulin treated type 2 diabetes mellitus (HCC) 11/30/2017  . Sleep apnea 11/30/2017  . Dyslipidemia 11/30/2017  . Morbid obesity (HCC) 12/31/2016  . Chest pain 12/31/2016    Past Surgical History:  Procedure Laterality Date  . BACK SURGERY    . LEFT HEART CATH AND CORONARY ANGIOGRAPHY N/A 12/13/2017   Procedure: LEFT HEART CATH AND CORONARY ANGIOGRAPHY;  Surgeon: Tonny Bollman, MD;  Location: [facility] INVASIVE [facility];  Service: Cardiovascular;  Laterality: N/A;        Home Medications    Prior to Admission medications   Medication Sig  Start Date End Date Taking? Authorizing Provider  Cholecalciferol (VITAMIN D3) 1000 units CAPS Take 1,000 Units by mouth daily.    [provider]  cyclobenzaprine (FLEXERIL) 10 MG tablet Take 10 mg by mouth at bedtime as needed for muscle pain. 12/03/17   [provider]  doxycycline (VIBRA-TABS) 100 MG tablet Take 1 tablet (100 mg total) by mouth 2 (two) times daily. 06/06/18   Rodriguez-Southworth, Nettie Elm, PA-C  glucose blood test strip 1 each by Other route daily as needed for other (blood sugar). Use as instructed     [provider]  losartan-hydrochlorothiazide (HYZAAR) 100-12.5 MG tablet Take 1 tablet by mouth daily. 06/08/18   Dorothyann Peng, MD  methocarbamol (ROBAXIN) 500 MG tablet Take 1 tablet (500 mg total) by mouth 2 (two) times daily as needed for muscle spasms. 07/22/17   Ward, Chase Picket, PA-C  metoprolol succinate (TOPROL-XL) 50 MG 24 hr tablet Take 50 mg by mouth daily. Take with or immediately following a meal.    [provider]  Olopatadine HCl (PAZEO) 0.7 % SOLN Place 1 drop into both eyes 2 (two) times daily.    [provider]  [facility] DELICA LANCETS 33G MISC 1 each by Does not apply route daily as needed (blood sugar).     [provider]  oxyCODONE-acetaminophen (PERCOCET) 10-325 MG tablet Take 1 tablet by mouth  every 8 (eight) hours as needed for pain.     [provider]  OZEMPIC, 0.25 OR 0.5 MG/DOSE, 2 MG/1.5ML SOPN INJECT 0.5 MG SQ ON THE SAME DAY EACH WEEK IN ABDOMEN/THIGH/UPPER ARM,ROTATE SITES 05/25/18   Dorothyann Peng, MD  pravastatin (PRAVACHOL) 40 MG tablet Take 40 mg by mouth daily.    [provider]  tadalafil (CIALIS) 20 MG tablet Take 20 mg by mouth daily as needed for erectile dysfunction.    [provider]  TRULICITY 0.75 MG/0.5ML SOPN INJECT 0.5 MLS SQ EVERY WEEK IN ABDOMEN,THIGH,OR UPPER ARM ROTATE SITES 12/31/17   [provider]    Family History Family History    Problem Relation Age of Onset  . Diabetes Mother   . Heart attack Mother 64       Died age 79  . Aneurysm Father 18  . Lung cancer Brother     Social History Social History   Tobacco Use  . Smoking status: Former Smoker    Types: Cigarettes  . Smokeless tobacco: Never Used  . Tobacco comment: Quit 8 years ago.   Substance Use Topics  . Alcohol use: No  . Drug use: No     Allergies   Patient has no known allergies.   Review of Systems Review of Systems  Musculoskeletal: Positive for gait problem and joint swelling.  Skin: Negative for color change and wound.  Neurological: Negative for numbness.     Physical Exam Updated Vital Signs BP (!) 145/105 (BP Location: Right Arm)   Pulse 76   Temp 98.8 F (37.1 C) (Oral)   Resp 16   SpO2 93%   Physical Exam  Constitutional: He is oriented to person, place, and time. He appears well-developed and well-nourished. No distress.  HENT:  Head: Normocephalic and atraumatic.  Eyes: Conjunctivae are normal.  Neck: Neck supple.  Musculoskeletal: Normal range of motion. He exhibits edema and tenderness.       Feet:  Tenderness and mild swelling on dorsal R lateral foot just distal to ankle with no medial or lateral malleolus tenderness; no warmth or erythema  Neurological: He is alert and oriented to person, place, and time.  Skin: Skin is warm and dry. No rash noted. No erythema.  Psychiatric: He has a normal mood and affect. Judgment normal.  Nursing note and vitals reviewed.    ED Treatments / Results  Labs (all labs ordered are listed, but only abnormal results are displayed) Labs Reviewed - No data to display  EKG None  Radiology Dg Foot Complete Right  Result Date: 06/08/2018 CLINICAL DATA:  Dorsal right foot pain. No known injury. History of gout. EXAM: RIGHT FOOT COMPLETE - 3+ VIEW COMPARISON:  02/17/2016. FINDINGS: Minimal calcaneal spur formation. Mild dorsal tarsal spur formation. Minimal 1st MTP joint  degenerative changes. No fracture or dislocation. Unremarkable soft tissues. IMPRESSION: Mild degenerative changes. No acute abnormality. Electronically Signed   By: Beckie Salts M.D.   On: 06/08/2018 13:06    Procedures Procedures (including critical care time)  Medications Ordered in ED Medications  acetaminophen (TYLENOL) tablet 1,000 mg (1,000 mg Oral Given 06/08/18 1236)     Initial Impression / Assessment and Plan / ED Course  I have reviewed the triage vital signs and the nursing notes.  Pertinent imaging results that were available during my care of the patient were reviewed by me and considered in my medical decision making (see chart for details).     No signs of infection  and my suspicion for gout is low based on the location of his pain.  X-ray shows degenerative changes without acute bony abnormality.  Have provided with cam walker to immobilize and crutches for comfort.  Recommended following up with a podiatrist and provided referral information as the patient has had this problem several times previously.  Return precautions reviewed and he voiced understanding.  Final Clinical Impressions(s) / ED Diagnoses   Final diagnoses:  Foot pain, left    ED Discharge Orders    None       , Ambrose Finland, MD 06/08/18 1356

## 2018-06-08 NOTE — ED Notes (Signed)
Pt back from X-ray.  

## 2018-06-09 ENCOUNTER — Other Ambulatory Visit: Payer: Self-pay

## 2018-06-09 MED ORDER — LOSARTAN POTASSIUM-HCTZ 100-12.5 MG PO TABS: 1.0000 | ORAL_TABLET | Freq: Every day | ORAL | 0 refills | Status: DC

## 2018-06-14 ENCOUNTER — Ambulatory Visit: Payer: Self-pay

## 2018-06-14 ENCOUNTER — Ambulatory Visit: Payer: Medicare Other

## 2018-06-14 ENCOUNTER — Ambulatory Visit (INDEPENDENT_AMBULATORY_CARE_PROVIDER_SITE_OTHER): Payer: Medicare Other | Admitting: Internal Medicine

## 2018-06-14 ENCOUNTER — Encounter: Payer: Self-pay | Admitting: Internal Medicine

## 2018-06-14 VITALS — BP 126/78 | HR 98 | Temp 89.0°F | Ht 74.0 in | Wt 301.0 lb

## 2018-06-14 DIAGNOSIS — N182 Chronic kidney disease, stage 2 (mild): Secondary | ICD-10-CM

## 2018-06-14 DIAGNOSIS — G4733 Obstructive sleep apnea (adult) (pediatric): Secondary | ICD-10-CM

## 2018-06-14 DIAGNOSIS — I420 Dilated cardiomyopathy: Secondary | ICD-10-CM

## 2018-06-14 DIAGNOSIS — I1310 Hypertensive heart and chronic kidney disease without heart failure, with stage 1 through stage 4 chronic kidney disease, or unspecified chronic kidney disease: Secondary | ICD-10-CM | POA: Diagnosis not present

## 2018-06-14 DIAGNOSIS — E1122 Type 2 diabetes mellitus with diabetic chronic kidney disease: Secondary | ICD-10-CM | POA: Diagnosis not present

## 2018-06-14 DIAGNOSIS — Z6838 Body mass index (BMI) 38.0-38.9, adult: Secondary | ICD-10-CM

## 2018-06-14 MED ORDER — SEMAGLUTIDE(0.25 OR 0.5MG/DOS) 2 MG/1.5ML ~~LOC~~ SOPN: 0.5000 mg | PEN_INJECTOR | SUBCUTANEOUS | 2 refills | Status: AC

## 2018-06-14 NOTE — Patient Instructions (Signed)

## 2018-06-15 LAB — CMP14+EGFR
ALT: 14 IU/L (ref 0–44)
AST: 21 IU/L (ref 0–40)
Albumin/Globulin Ratio: 1.3 (ref 1.2–2.2)
Albumin: 4 g/dL (ref 3.6–4.8)
Alkaline Phosphatase: 54 IU/L (ref 39–117)
BUN/Creatinine Ratio: 10 (ref 10–24)
BUN: 13 mg/dL (ref 8–27)
Bilirubin Total: 0.4 mg/dL (ref 0.0–1.2)
CO2: 26 mmol/L (ref 20–29)
Calcium: 9.8 mg/dL (ref 8.6–10.2)
Chloride: 103 mmol/L (ref 96–106)
Creatinine, Ser: 1.26 mg/dL (ref 0.76–1.27)
GFR calc Af Amer: 67 mL/min/{1.73_m2} (ref >59)
GFR calc non Af Amer: 58 mL/min/{1.73_m2} — ABNORMAL LOW (ref >59)
Globulin, Total: 3.2 g/dL (ref 1.5–4.5)
Glucose: 95 mg/dL (ref 65–99)
Potassium: 4.2 mmol/L (ref 3.5–5.2)
Sodium: 142 mmol/L (ref 134–144)
Total Protein: 7.2 g/dL (ref 6.0–8.5)

## 2018-06-15 LAB — LIPID PANEL
Chol/HDL Ratio: 3.8 ratio (ref 0.0–5.0)
Cholesterol, Total: 135 mg/dL (ref 100–199)
HDL: 36 mg/dL — ABNORMAL LOW (ref >39)
LDL Calculated: 67 mg/dL (ref 0–99)
Triglycerides: 158 mg/dL — ABNORMAL HIGH (ref 0–149)
VLDL Cholesterol Cal: 32 mg/dL (ref 5–40)

## 2018-06-15 LAB — HEMOGLOBIN A1C
Est. average glucose Bld gHb Est-mCnc: 117 mg/dL
Hgb A1c MFr Bld: 5.7 % — ABNORMAL HIGH (ref 4.8–5.6)

## 2018-06-15 NOTE — Progress Notes (Signed)
Here are your lab results:  Your liver and kidney function are stable. Your a1c is 5.7, this is great. Continue with ozempic. Your triglycerides are elevated, this is a fat in the blood. Please increase exercise as we discussed.   Sincerely,    Kiylee Thoreson N. Allyne GeeSanders, MD

## 2018-06-18 ENCOUNTER — Encounter: Payer: Self-pay | Admitting: Internal Medicine

## 2018-06-18 NOTE — Progress Notes (Signed)
Subjective:     Patient ID: Raymond Welch , male    DOB: [birth year] , 67 y.o.   MRN: [id number]   Chief Complaint  Patient presents with  . Diabetes  . Hypertension    HPI  Diabetes  He presents for his follow-up diabetic visit. He has type 2 diabetes mellitus. His disease course has been improving. There are no hypoglycemic associated symptoms. There are no diabetic associated symptoms. There are no hypoglycemic complications. Symptoms are improving. Diabetic complications include nephropathy. Risk factors for coronary artery disease include diabetes mellitus, dyslipidemia, hypertension, male sex and sedentary lifestyle.  Hypertension  This is a chronic problem. The current episode started more than 1 year ago. The problem has been gradually improving since onset. The problem is controlled.   REPORTS COMPLIANCE WITH MEDS.   Past Medical History:  Diagnosis Date  . Hypertension   . Low back pain   . Pure hypercholesterolemia   . Sleep apnea    Doesn't uses CPAP "like he is supposed to"  . Type 2 diabetes mellitus (HCC)      Family History  Problem Relation Age of Onset  . Diabetes Mother   . Heart attack Mother 10       Died age 73  . Aneurysm Father 2  . Lung cancer Brother      Current Outpatient Medications:  .  cyclobenzaprine (FLEXERIL) 10 MG tablet, Take 10 mg by mouth at bedtime as needed for muscle pain., Disp: , Rfl: 2 .  glucose blood test strip, 1 each by Other route daily as needed for other (blood sugar). Use as instructed , Disp: , Rfl:  .  losartan-hydrochlorothiazide (HYZAAR) 100-12.5 MG tablet, Take 1 tablet by mouth daily., Disp: 90 tablet, Rfl: 0 .  metoprolol succinate (TOPROL-XL) 50 MG 24 hr tablet, Take 50 mg by mouth daily. Take with or immediately following a meal., Disp: , Rfl:  .  Olopatadine HCl (PAZEO) 0.7 % SOLN, Place 1 drop into both eyes 2 (two) times daily., Disp: , Rfl:  .  ONETOUCH DELICA LANCETS 38O MISC, 1 each by Does not apply route  daily as needed (blood sugar). , Disp: , Rfl:  .  oxyCODONE-acetaminophen (PERCOCET) 10-325 MG tablet, Take 1 tablet by mouth every 8 (eight) hours as needed for pain. , Disp: , Rfl:  .  pravastatin (PRAVACHOL) 40 MG tablet, Take 40 mg by mouth daily., Disp: , Rfl: 2 .  Semaglutide,0.25 or 0.5MG/DOS, (OZEMPIC, 0.25 OR 0.5 MG/DOSE,) 2 MG/1.5ML SOPN, Inject 0.5 mg into the skin once a week for 4 doses., Disp: 3 pen, Rfl: 2 .  tadalafil (CIALIS) 20 MG tablet, Take 20 mg by mouth daily as needed for erectile dysfunction., Disp: , Rfl:    No Known Allergies   Review of Systems  Constitutional: Negative.   Respiratory: Negative.   Cardiovascular: Negative.   Gastrointestinal: Negative.   Neurological: Negative.   Psychiatric/Behavioral: Negative.      Today's Vitals   06/14/18 1154  BP: 126/78  Pulse: 98  Temp: (!) 89 F (31.7 C)  TempSrc: Oral  Weight: (!) 301 lb (136.5 kg)  Height: _0  (1.88 m)  PainSc: 8   PainLoc: Back   Body mass index is 38.65 kg/m.   Objective:  Physical Exam  Constitutional: He is oriented to person, place, and time. He appears well-developed and well-nourished.  HENT:  Head: Normocephalic and atraumatic.  Cardiovascular: Normal rate, regular rhythm and normal heart sounds.  Pulmonary/Chest: Effort normal  and breath sounds normal.  Neurological: He is alert and oriented to person, place, and time.  Psychiatric: He has a normal mood and affect.  Nursing note and vitals reviewed.       Assessment And Plan:     1. Type 2 diabetes mellitus with stage 2 chronic kidney disease, without long-term current use of insulin (HCC)  I WILL CHECK LABS AS LISTED BELOW. IMPORTANCE OF REGULAR EXERCISE WAS DISCUSSED WITH THE PATIENT. HE IS ENCOURAGED TO EXERCISE FOR AT LEAST 30 MINUTES FOUR TO FIVE DAYS WEEKLY.  - CMP14+EGFR - Hemoglobin A1c - Lipid Profile  2. Chronic renal disease, stage II  CHRONIC. I WILL CHECK A GFR, CR TODAY.   3. Hypertensive heart  and renal disease with renal failure, stage 1 through stage 4 or unspecified chronic kidney disease, without heart failure  CHRONIC, YET STABLE. HE WILL CONTINUE WITH CURRENT MEDS. HE IS ENCOURAGED TO AVOID ADDING SALT TO HIS FOODS.   4. Dilated cardiomyopathy (HCC)  CHRONIC, YET STABLE.   5. Obstructive sleep apnea  CHRONIC. IMPORTANCE OF COMPLIANCE WITH CPAP WAS DISCUSSED WITH THE PATIENT. HE IS ENCOURAGED TO USE CPAP FOR AT LEAST SIX HOURS PER NIGHT.   6. Class 2 severe obesity due to excess calories with serious comorbidity and body mass index (BMI) of 38.0 to 38.9 in adult ([place])  HE IS ENCOURAGED TO STRIVE FOR BMI LESS THAN 32 TO DECREASE CARDIAC RISK. HE IS ENCOURAGED TO AVOID REFINED CARBS, ESPECIALLY AT HIS EVENING MEAL.   Raymond Greenland, MD

## 2018-06-25 ENCOUNTER — Other Ambulatory Visit: Payer: Self-pay | Admitting: Nurse Practitioner

## 2018-07-14 ENCOUNTER — Ambulatory Visit (HOSPITAL_BASED_OUTPATIENT_CLINIC_OR_DEPARTMENT_OTHER): Payer: Medicare Other | Attending: Cardiology | Admitting: Cardiovascular Disease

## 2018-07-14 VITALS — Ht 74.0 in | Wt 301.0 lb

## 2018-07-14 DIAGNOSIS — G4733 Obstructive sleep apnea (adult) (pediatric): Secondary | ICD-10-CM | POA: Diagnosis not present

## 2018-07-14 DIAGNOSIS — Z6841 Body Mass Index (BMI) 40.0 and over, adult: Secondary | ICD-10-CM | POA: Insufficient documentation

## 2018-07-14 DIAGNOSIS — R5383 Other fatigue: Secondary | ICD-10-CM | POA: Insufficient documentation

## 2018-07-14 DIAGNOSIS — E669 Obesity, unspecified: Secondary | ICD-10-CM | POA: Insufficient documentation

## 2018-07-14 DIAGNOSIS — I493 Ventricular premature depolarization: Secondary | ICD-10-CM | POA: Insufficient documentation

## 2018-07-14 DIAGNOSIS — Z79899 Other long term (current) drug therapy: Secondary | ICD-10-CM | POA: Diagnosis not present

## 2018-07-19 ENCOUNTER — Ambulatory Visit (INDEPENDENT_AMBULATORY_CARE_PROVIDER_SITE_OTHER): Payer: Medicare Other

## 2018-07-19 VITALS — BP 130/70 | HR 83 | Temp 98.2°F | Ht 72.6 in | Wt 299.6 lb

## 2018-07-19 DIAGNOSIS — E119 Type 2 diabetes mellitus without complications: Secondary | ICD-10-CM

## 2018-07-19 DIAGNOSIS — Z0000 Encounter for general adult medical examination without abnormal findings: Secondary | ICD-10-CM

## 2018-07-19 LAB — POCT UA - MICROALBUMIN
Albumin/Creatinine Ratio, Urine, POC: <30
Creatinine, POC: 300 mg/dL
Microalbumin Ur, POC: 30 mg/L

## 2018-07-19 LAB — POCT URINALYSIS DIPSTICK
Bilirubin, UA: NEGATIVE
Blood, UA: NEGATIVE
Glucose, UA: NEGATIVE
Ketones, UA: NEGATIVE
Leukocytes, UA: NEGATIVE
Nitrite, UA: NEGATIVE
Protein, UA: NEGATIVE
Spec Grav, UA: 1.025 (ref 1.010–1.025)
Urobilinogen, UA: 1 E.U./dL
pH, UA: 6.5 (ref 5.0–8.0)

## 2018-07-19 NOTE — Progress Notes (Signed)
Subjective:   Raymond Welch is a 67 y.o. male who presents for Medicare Annual/Subsequent preventive examination.  Review of Systems:  n/a Cardiac Risk Factors include: advanced age (>5955men, 59>65 women);diabetes mellitus;hypertension;obesity (BMI >30kg/m2);sedentary lifestyle     Objective:    Vitals: BP 130/70 (BP Location: Left Arm, Patient Position: Sitting)   Pulse 83   Temp 98.2 F (36.8 C) (Oral)   Ht 6' 0.6" (1.844 m)   Wt 299 lb 9.6 oz (135.9 kg)   BMI 39.96 kg/m   Body mass index is 39.96 kg/m.  Advanced Directives 07/19/2018 06/08/2018 12/13/2017 07/24/2017 07/22/2017 02/17/2016  Does Patient Have a Medical Advance Directive? No No No No No No  Would patient like information on creating a medical advance directive? Yes (MAU/Ambulatory/Procedural Areas - Information given) No - Patient declined No - Patient declined - - -    Tobacco Social History   Tobacco Use  Smoking Status Former Smoker  . Types: Cigarettes  Smokeless Tobacco Never Used  Tobacco Comment   Quit 8 years ago.      Counseling given: Not Answered Comment: Quit 8 years ago.    Clinical Intake:  Pre-visit preparation completed: Yes  Pain : 0-10 Pain Score: 8  Pain Type: Chronic pain Pain Location: Back Pain Orientation: Lower Pain Descriptors / Indicators: Aching Pain Onset: More than a month ago Pain Frequency: Constant Pain Relieving Factors: percocet eases Effect of Pain on Daily Activities: slows down  Pain Relieving Factors: percocet eases  Nutritional Status: BMI > 30  Obese Nutritional Risks: None Diabetes: Yes CBG done?: No Did pt. bring in CBG monitor from home?: No  How often do you need to have someone help you when you read instructions, pamphlets, or other written materials from your doctor or pharmacy?: 1 - Never What is the last grade level you completed in school?: 8th grade  Interpreter Needed?: No  Information entered by :: NAllen LPN  Past Medical History:    Diagnosis Date  . Hypertension   . Low back pain   . Pure hypercholesterolemia   . Sleep apnea    Doesn't uses CPAP "like he is supposed to"  . Type 2 diabetes mellitus (HCC)    Past Surgical History:  Procedure Laterality Date  . BACK SURGERY    . LEFT HEART CATH AND CORONARY ANGIOGRAPHY N/A 12/13/2017   Procedure: LEFT HEART CATH AND CORONARY ANGIOGRAPHY;  Surgeon: Tonny Bollmanooper, Michael, MD;  [facility];  Service: Cardiovascular;  Laterality: N/A;   Family History  Problem Relation Age of Onset  . Diabetes Mother   . Heart attack Mother [id number]       Died age 68  . Aneurysm Father [id number]  . Lung cancer Brother    Social History   Socioeconomic History  . Marital status: Married    Spouse name: Not on file  . Number of children: 8  . Years of education: Not on file  . Highest education level: Not on file  Occupational History  . Occupation: Location managerMachine operator  . Occupation: retired  Engineer, productionocial Needs  . Financial resource strain: Not hard at all  . Food insecurity:    Worry: Never true    Inability: Never true  . Transportation needs:    Medical: No    Non-medical: No  Tobacco Use  . Smoking status: Former Smoker    Types: Cigarettes  . Smokeless tobacco: Never Used  . Tobacco comment: Quit 8 years ago.   Substance and  Sexual Activity  . Alcohol use: No  . Drug use: No  . Sexual activity: Yes  Lifestyle  . Physical activity:    Days per week: 0 days    Minutes per session: 0 min  . Stress: Not at all  Relationships  . Social connections:    Talks on phone: Not on file    Gets together: Not on file    Attends religious service: Not on file    Active member of club or organization: Not on file    Attends meetings of clubs or organizations: Not on file    Relationship status: Not on file  Other Topics Concern  . Not on file  Social History Narrative   Lives at home with wife.      Outpatient Encounter Medications as of 07/19/2018  Medication Sig  .  amLODipine (NORVASC) 5 MG tablet Take 5 mg by mouth daily.  . cyclobenzaprine (FLEXERIL) 10 MG tablet Take 10 mg by mouth at bedtime as needed for muscle pain.  Marland Kitchen glucose blood test strip 1 each by Other route daily as needed for other (blood sugar). Use as instructed   . losartan-hydrochlorothiazide (HYZAAR) 100-12.5 MG tablet Take 1 tablet by mouth daily.  . metoprolol succinate (TOPROL-XL) 50 MG 24 hr tablet Take 50 mg by mouth daily. Take with or immediately following a meal.  . Olopatadine HCl (PAZEO) 0.7 % SOLN Place 1 drop into both eyes 2 (two) times daily.  Letta Pate DELICA LANCETS 33G MISC 1 each by Does not apply route daily as needed (blood sugar).   Marland Kitchen oxyCODONE-acetaminophen (PERCOCET) 10-325 MG tablet Take 1 tablet by mouth every 8 (eight) hours as needed for pain.   . pravastatin (PRAVACHOL) 40 MG tablet TAKE 1 TABLET BY MOUTH EVERY DAY  . tadalafil (CIALIS) 20 MG tablet Take 20 mg by mouth daily as needed for erectile dysfunction.   No facility-administered encounter medications on file as of 07/19/2018.     Activities of Daily Living In your present state of health, do you have any difficulty performing the following activities: 07/19/2018  Hearing? N  Vision? Y  Comment eyes get blurry at times  Difficulty concentrating or making decisions? N  Walking or climbing stairs? N  Dressing or bathing? N  Doing errands, shopping? N  Preparing Food and eating ? N  Using the Toilet? N  In the past six months, have you accidently leaked urine? N  Do you have problems with loss of bowel control? N  Managing your Medications? N  Managing your Finances? N  Housekeeping or managing your Housekeeping? N  Some recent data might be hidden    Patient Care Team: Dorothyann Peng, MD as PCP - General (Internal Medicine)   Assessment:   This is a routine wellness examination for Raymond Welch.  Exercise Activities and Dietary recommendations Current Exercise Habits: The patient does not  participate in regular exercise at present, Exercise limited by: orthopedic condition(s)(back issues)  Goals   None     Fall Risk Fall Risk  07/19/2018 06/06/2018  Falls in the past year? 0 0   Is the patient's home free of loose throw rugs in walkways, pet beds, electrical cords, etc?   yes      Grab bars in the bathroom? no      Handrails on the stairs?   yes      Adequate lighting?   yes  Timed Get Up and Go Performed: n/a  Depression Screen [facility] 2/9  Scores 07/19/2018 06/06/2018  PHQ - 2 Score 0 0  PHQ- 9 Score 0 -    Cognitive Function     6CIT Screen 07/19/2018  What Year? 0 points  What month? 0 points  What time? 0 points  Count back from 20 0 points  Months in reverse 2 points  Repeat phrase 2 points  Total Score 4     There is no immunization history on file for this patient.  Qualifies for Shingles Vaccine? yes  Screening Tests Health Maintenance  Topic Date Due  . Hepatitis C Screening  [birth year]  . FOOT EXAM  02/15/1960  . OPHTHALMOLOGY EXAM  02/15/1960  . INFLUENZA VACCINE  06/20/2019 (Originally 03/02/2018)  . PNA vac Low Risk Adult (1 of 2 - PCV13) 07/20/2019 (Originally 02/15/2015)  . HEMOGLOBIN A1C  12/13/2018  . COLONOSCOPY  10/02/2023  . TETANUS/TDAP  02/14/2025   Cancer Screenings: Lung: Low Dose CT Chest recommended if Age 62-80 years, 30 pack-year currently smoking OR have quit w/in 15years. Patient does qualify. Colorectal: up to date  Additional Screenings:  Hepatitis C Screening:due      Plan:    Declined all vaccinations  I have personally reviewed and noted the following in the patient's chart:   . Medical and social history . Use of alcohol, tobacco or illicit drugs  . Current medications and supplements . Functional ability and status . Nutritional status . Physical activity . Advanced directives . List of other physicians . Hospitalizations, surgeries, and ER visits in previous 12 months . Vitals . Screenings to  include cognitive, depression, and falls . Referrals and appointments  In addition, I have reviewed and discussed with patient certain preventive protocols, quality metrics, and best practice recommendations. A written personalized care plan for preventive services as well as general preventive health recommendations were provided to patient.     Barb Merino, LPN  16/05/9603

## 2018-07-19 NOTE — Patient Instructions (Signed)
Raymond Welch , Thank you for taking time to come for your Medicare Wellness Visit. I appreciate your ongoing commitment to your health goals. Please review the following plan we discussed and let me know if I can assist you in the future.   Screening recommendations/referrals: Colonoscopy: 09/2013 Recommended yearly ophthalmology/optometry visit for glaucoma screening and checkup Recommended yearly dental visit for hygiene and checkup  Vaccinations: Influenza vaccine: decline Pneumococcal vaccine: 04/2016 Tdap vaccine: 01/2015 Shingles vaccine: decline    Advanced directives: Advance directive discussed with you today. I have provided a copy for you to complete at home and have notarized. Once this is complete please bring a copy in to our office so we can scan it into your chart.   Conditions/risks identified: obesity Exercise for Older Adults Staying physically active is important as you age. The four types of exercises that are best for older adults are endurance, strength, balance, and flexibility. Contact your health care provider before you start any exercise routine. Ask your health care provider what activities are safe for you. What are the risks? Risks associated with exercising include:  Overdoing it. This may lead to sore muscles or fatigue.  Falls.  Injuries.  Dehydration. How to do these exercises Endurance exercises Endurance (aerobic) exercises raise your breathing rate and heart rate. Increasing your endurance helps you to do everyday tasks and stay healthy. By improving the health of your body system that includes your heart, lungs, and blood vessels (circulatory system), you may also delay or prevent diseases such as heart disease, diabetes, and bone loss (osteoporosis). Types of endurance exercises include:  Sports.  Indoor activities, such as using gym equipment, doing water aerobics, or dancing.  Outdoor activities, such as biking or jogging.  Tasks around the  house, such as gardening, yard work, and heavy household chores like cleaning.  Walking, such as hiking or walking around your neighborhood. When doing endurance exercises, make sure you:  Are aware of your surroundings.  Use safety equipment as directed.  Dress in layers when exercising outdoors.  Drink plenty of water to stay well hydrated. Build up endurance slowly. Start with 10 minutes at a time, and gradually build up to doing 30 minutes at a time. Unless your health care provider gave you different instructions, aim to exercise for a total of 150 minutes a week. Spread out that time so you are working on endurance on 3 or more days a week. Strength exercises Lifting, pulling, or pushing weights helps to strengthen muscles. Having stronger muscles makes it easier to do everyday activities, such as getting up from a chair, climbing stairs, carrying groceries, and playing with grandchildren. Strength exercises include arm and leg exercises that may be done:  With weights.  Without weights (using your own body weight).  With a resistance band. When doing strength exercises:  Move smoothly and steadily. Do not suddenly thrust or jerk the weights, the resistance band, or your body.  Start with no weights or with light weights, and gradually add more weight over time. Eventually, aim to use weights that are hard or very hard for you to lift. This means that you are able to do 8 repetitions with the weight, and the last few repetitions are very challenging.  Lift or push weights into position for 3 seconds, hold the position for 1 second, and then take 3 seconds to return to your starting position.  Breathe out (exhale) during difficult movements, like lifting or pushing weights. Breathe in (inhale) to  relax your muscles before the next repetition.  Consider alternating arms or legs, especially when you first start strength exercises.  Expect some slight muscle soreness after each  session. Do strength exercises on 2 or more days a week, for 30 minutes at a time. Avoid exercising the same muscle groups two days in a row. For example, if you work on your leg muscles one day, work on your arm muscles the next day. When you can do two sets of 10-15 repetitions with a certain weight, increase the amount of weight. Balance Balance exercises can help to prevent falls. Balance exercises include:  Standing on one foot.  Heel-to-toe walk.  Balance walk.  Tai chi. Make sure you have something sturdy to hold onto while doing balance exercises, such as a sturdy chair. As your balance improves, challenge yourself by holding onto the chair with one hand instead of two, and then with no hands. Trying exercises with your eyes closed also challenges your balance, but be sure to have a sturdy surface (like a countertop) close by in case you need it. Do balance exercises as often as you want, or as often as directed by your health care provider. Strength exercises for the lower body also help to improve balance. Flexibility Flexibility exercises improve how far you can bend, straighten, move, or rotate parts of your body (range of motion). These exercises also help you to do everyday activities such as getting dressed or reaching for objects. Flexibility exercises include stretching different parts of the body, and they may be done in a standing or seated position or on the floor. When stretching, make sure you:  Keep a slight bend in your arms and legs. Avoid completely straightening ("locking") your joints.  Do not stretch so far that you feel pain. You should feel a mild stretching feeling. You may try stretching farther as you become more flexible over time.  Relax and breathe between stretches.  Hold onto something sturdy for balance as needed. Hold each stretch for 10-30 seconds. Repeat each stretch 3-5 times. General safety tips  Exercise in well-lit areas.  Do not hold your  breath during exercises or stretches.  Warm up before exercising, and cool down after exercising. This can help prevent injury.  Drink plenty of water during exercise or any activity that makes you sweat.  Use smooth, steady movements. Do not use sudden, jerking movements, especially when lifting weights or doing flexibility exercises.  If you are not sure if an exercise is safe for you, or you are not sure how to do an exercise, talk with your health care provider. This is especially important if you have had surgery on muscles, bones, or joints (orthopedic surgery). Where to find more information You can find more information about exercise for older adults from:  Your local health department, fitness center, or community center. These facilities may have programs for aging adults.  [employer] on Aging: http://kim-miller.com/  National Council on Aging: www.ncoa.org Summary  Staying physically active is important as you age.  Make sure to contact your health care provider before you start any exercise routine. Ask your health care provider what activities are safe for you.  Doing endurance, strength, balance, and flexibility exercises can help to delay or prevent certain diseases, such as heart disease, diabetes, and bone loss (osteoporosis). This information is not intended to replace advice given to you by your health care provider. Make sure you discuss any questions you have with your health care  provider. Document Released: 12/08/2016 Document Revised: 12/08/2016 Document Reviewed: 12/08/2016 Elsevier Interactive Patient Education  2019 [employer].   Next appointment: 10/18/2018 at 10:00  Preventive Care 65 Years and Older, Male Preventive care refers to lifestyle choices and visits with your health care provider that can promote health and wellness. What does preventive care include?  A yearly physical exam. This is also called an annual well check.  Dental exams once  or twice a year.  Routine eye exams. Ask your health care provider how often you should have your eyes checked.  Personal lifestyle choices, including:  Daily care of your teeth and gums.  Regular physical activity.  Eating a healthy diet.  Avoiding tobacco and drug use.  Limiting alcohol use.  Practicing safe sex.  Taking low doses of aspirin every day.  Taking vitamin and mineral supplements as recommended by your health care provider. What happens during an annual well check? The services and screenings done by your health care provider during your annual well check will depend on your age, overall health, lifestyle risk factors, and family history of disease. Counseling  Your health care provider may ask you questions about your:  Alcohol use.  Tobacco use.  Drug use.  Emotional well-being.  Home and relationship well-being.  Sexual activity.  Eating habits.  History of falls.  Memory and ability to understand (cognition).  Work and work Statistician. Screening  You may have the following tests or measurements:  Height, weight, and BMI.  Blood pressure.  Lipid and cholesterol levels. These may be checked every 5 years, or more frequently if you are over 69 years old.  Skin check.  Lung cancer screening. You may have this screening every year starting at age 58 if you have a 30-pack-year history of smoking and currently smoke or have quit within the past 15 years.  Fecal occult blood test (FOBT) of the stool. You may have this test every year starting at age 73.  Flexible sigmoidoscopy or colonoscopy. You may have a sigmoidoscopy every 5 years or a colonoscopy every 10 years starting at age 9.  Prostate cancer screening. Recommendations will vary depending on your family history and other risks.  Hepatitis C blood test.  Hepatitis B blood test.  Sexually transmitted disease (STD) testing.  Diabetes screening. This is done by checking your blood  sugar (glucose) after you have not eaten for a while (fasting). You may have this done every 1-3 years.  Abdominal aortic aneurysm (AAA) screening. You may need this if you are a current or former smoker.  Osteoporosis. You may be screened starting at age 72 if you are at high risk. Talk with your health care provider about your test results, treatment options, and if necessary, the need for more tests. Vaccines  Your health care provider may recommend certain vaccines, such as:  Influenza vaccine. This is recommended every year.  Tetanus, diphtheria, and acellular pertussis (Tdap, Td) vaccine. You may need a Td booster every 10 years.  Zoster vaccine. You may need this after age 45.  Pneumococcal 13-valent conjugate (PCV13) vaccine. One dose is recommended after age 63.  Pneumococcal polysaccharide (PPSV23) vaccine. One dose is recommended after age 74. Talk to your health care provider about which screenings and vaccines you need and how often you need them. This information is not intended to replace advice given to you by your health care provider. Make sure you discuss any questions you have with your health care provider. Document Released: 08/15/2015  Document Revised: 04/07/2016 Document Reviewed: 05/20/2015 Elsevier Interactive Patient Education  2017 [place] Prevention in the Home Falls can cause injuries. They can happen to people of all ages. There are many things you can do to make your home safe and to help prevent falls. What can I do on the outside of my home?  Regularly fix the edges of walkways and driveways and fix any cracks.  Remove anything that might make you trip as you walk through a door, such as a raised step or threshold.  Trim any bushes or trees on the path to your home.  Use bright outdoor lighting.  Clear any walking paths of anything that might make someone trip, such as rocks or tools.  Regularly check to see if handrails are loose or  broken. Make sure that both sides of any steps have handrails.  Any raised decks and porches should have guardrails on the edges.  Have any leaves, snow, or ice cleared regularly.  Use sand or salt on walking paths during winter.  Clean up any spills in your garage right away. This includes oil or grease spills. What can I do in the bathroom?  Use night lights.  Install grab bars by the toilet and in the tub and shower. Do not use towel bars as grab bars.  Use non-skid mats or decals in the tub or shower.  If you need to sit down in the shower, use a plastic, non-slip stool.  Keep the floor dry. Clean up any water that spills on the floor as soon as it happens.  Remove soap buildup in the tub or shower regularly.  Attach bath mats securely with double-sided non-slip rug tape.  Do not have throw rugs and other things on the floor that can make you trip. What can I do in the bedroom?  Use night lights.  Make sure that you have a light by your bed that is easy to reach.  Do not use any sheets or blankets that are too big for your bed. They should not hang down onto the floor.  Have a firm chair that has side arms. You can use this for support while you get dressed.  Do not have throw rugs and other things on the floor that can make you trip. What can I do in the kitchen?  Clean up any spills right away.  Avoid walking on wet floors.  Keep items that you use a lot in easy-to-reach places.  If you need to reach something above you, use a strong step stool that has a grab bar.  Keep electrical cords out of the way.  Do not use floor polish or wax that makes floors slippery. If you must use wax, use non-skid floor wax.  Do not have throw rugs and other things on the floor that can make you trip. What can I do with my stairs?  Do not leave any items on the stairs.  Make sure that there are handrails on both sides of the stairs and use them. Fix handrails that are  broken or loose. Make sure that handrails are as long as the stairways.  Check any carpeting to make sure that it is firmly attached to the stairs. Fix any carpet that is loose or worn.  Avoid having throw rugs at the top or bottom of the stairs. If you do have throw rugs, attach them to the floor with carpet tape.  Make sure that you have a light  switch at the top of the stairs and the bottom of the stairs. If you do not have them, ask someone to add them for you. What else can I do to help prevent falls?  Wear shoes that:  Do not have high heels.  Have rubber bottoms.  Are comfortable and fit you well.  Are closed at the toe. Do not wear sandals.  If you use a stepladder:  Make sure that it is fully opened. Do not climb a closed stepladder.  Make sure that both sides of the stepladder are locked into place.  Ask someone to hold it for you, if possible.  Clearly mark and make sure that you can see:  Any grab bars or handrails.  First and last steps.  Where the edge of each step is.  Use tools that help you move around (mobility aids) if they are needed. These include:  Canes.  Walkers.  Scooters.  Crutches.  Turn on the lights when you go into a dark area. Replace any light bulbs as soon as they burn out.  Set up your furniture so you have a clear path. Avoid moving your furniture around.  If any of your floors are uneven, fix them.  If there are any pets around you, be aware of where they are.  Review your medicines with your doctor. Some medicines can make you feel dizzy. This can increase your chance of falling. Ask your doctor what other things that you can do to help prevent falls. This information is not intended to replace advice given to you by your health care provider. Make sure you discuss any questions you have with your health care provider. Document Released: 05/15/2009 Document Revised: 12/25/2015 Document Reviewed: 08/23/2014 Elsevier  Interactive Patient Education  2017 [employer].

## 2018-07-25 ENCOUNTER — Encounter (HOSPITAL_BASED_OUTPATIENT_CLINIC_OR_DEPARTMENT_OTHER): Payer: Self-pay | Admitting: Cardiovascular Disease

## 2018-07-25 NOTE — Procedures (Signed)
Patient Name: Raymond Welch, Raymond Welch Date: 07/14/2018 Gender: Male D.O.B: Oct 08, 1950 Age (years): 67 Referring Provider: Minus Breeding Height (inches): 43 Interpreting Physician: Shelva Majestic MD, ABSM Weight (lbs): 301 RPSGT: Baxter Flattery BMI: 41 MRN: [id number] Neck Size: 18.50  CLINICAL INFORMATION Sleep Study Type: Split Night CPAP  Indication for sleep study: Fatigue, Obesity, OSA, Snoring, Witnessed Apneas  Epworth Sleepiness Score: 17  SLEEP STUDY TECHNIQUE As per the AASM Manual for the Scoring of Sleep and Associated Events v2.3 (April 2016) with a hypopnea requiring 4% desaturations.  The channels recorded and monitored were frontal, central and occipital EEG, electrooculogram (EOG), submentalis EMG (chin), nasal and oral airflow, thoracic and abdominal wall motion, anterior tibialis EMG, snore microphone, electrocardiogram, and pulse oximetry. Continuous positive airway pressure (CPAP) was initiated when the patient met split night criteria and was titrated according to treat sleep-disordered breathing.  MEDICATIONS     amLODipine (NORVASC) 5 MG tablet             cyclobenzaprine (FLEXERIL) 10 MG tablet         glucose blood test strip         losartan-hydrochlorothiazide (HYZAAR) 100-12.5 MG tablet         metoprolol succinate (TOPROL-XL) 50 MG 24 hr tablet         Olopatadine HCl (PAZEO) 0.7 % SOLN         ONETOUCH DELICA LANCETS 29W MISC         oxyCODONE-acetaminophen (PERCOCET) 10-325 MG tablet         pravastatin (PRAVACHOL) 40 MG tablet         tadalafil (CIALIS) 20 MG tablet      Medications self-administered by patient taken the night of the study : N/A  RESPIRATORY PARAMETERS Diagnostic Total AHI (/hr): 53.6 RDI (/hr): 53.6 OA Index (/hr): 8.6 CA Index (/hr): 0.8 REM AHI (/hr): 61.0 NREM AHI (/hr): 51.7 Supine AHI (/hr): 98.3 Non-supine AHI (/hr): 37 Min O2 Sat (%): 71.0 Mean O2 (%): 87.6 Time below 88% (min): 62   Titration Optimal Pressure  (cm): 12 AHI at Optimal Pressure (/hr): 0.0 Min O2 at Optimal Pressure (%): 90.0 Supine % at Optimal (%): 0 Sleep % at Optimal (%): 100   SLEEP ARCHITECTURE The recording time for the entire night was 390.4 minutes.  During a baseline period of 192.1 minutes, the patient slept for 153.5 minutes in REM and nonREM, yielding a sleep efficiency of 79.9%%. Sleep onset after lights out was 12.3 minutes with a REM latency of 75.0 minutes. The patient spent 17.3%% of the night in stage N1 sleep, 62.9%% in stage N2 sleep, 0.0%% in stage N3 and 19.9% in REM.  During the titration period of 192.8 minutes, the patient slept for 143.6 minutes in REM and nonREM, yielding a sleep efficiency of 74.5%%. Sleep onset after CPAP initiation was 41.2 minutes with a REM latency of 50.5 minutes. The patient spent 5.6%% of the night in stage N1 sleep, 78.8%% in stage N2 sleep, 0.0%% in stage N3 and 15.7% in REM.  CARDIAC DATA The 2 lead EKG demonstrated sinus rhythm. The mean heart rate was 100.0 beats per minute. Other EKG findings include: PVCs.  LEG MOVEMENT DATA The total Periodic Limb Movements of Sleep (PLMS) were 0. The PLMS index was 0.0 .  IMPRESSIONS - Severe obstructive sleep apnea occurred during the diagnostic portion of the study (AHI 53.6/hour). Events were worse during REM sleep (AHI 61.0/h) and with supine sleep (AHI 98.3/h). CPAP  was initiated at 5 cm and was titrated to optimal CPAP pressure of 12 cm of water.  AHI 0 at 12 cm; O2 nadir 90%. - No significant central sleep apnea occurred during the diagnostic portion of the study (CAI = 0.8/hour). - Severe oxygen desaturation during the diagnostic portion of the study to a badir of 71%. - No snoring was audible during the diagnostic portion of the study. - EKG findings include PVCs. - Clinically significant periodic limb movements did not occur during sleep.  DIAGNOSIS - Obstructive Sleep Apnea (327.23 [G47.33 ICD-10])  RECOMMENDATIONS -  Recommend an initial trial of CPAP therapy with EPR at  12 cm H2O with heated humidification. A  large size Fisher&Paykel Nasal Mask Eson mask was used for the titration. - Efforts should be made to optimize nasal and pharyngeal patency.  - Avoid alcohol, sedatives and other CNS depressants that may worsen sleep apnea and disrupt normal sleep architecture. - Sleep hygiene should be reviewed to assess factors that may improve sleep quality. - Weight management and regular exercise should be initiated or continued. - Recommend a download be obtained in 30 days and sleep clinic evaluation after 4 weeks of therapy  [Electronically signed] 07/25/2018 10:08 AM  Shelva Majestic MD, [facility], ABSM Diplomate, American Board of Sleep Medicine   NPI: [id number]  [place] PH: [phone]   FX: [phone] [place]

## 2018-08-09 ENCOUNTER — Telehealth: Payer: Self-pay | Admitting: *Deleted

## 2018-08-09 NOTE — Telephone Encounter (Signed)
Patient and wife notified of sleep study results. Patient states he already has a machine from prior therapy and will take to [facility] to have pressures reset to current order.

## 2018-08-09 NOTE — Telephone Encounter (Signed)
-----   Message from Thomas A Kelly, MD sent at 07/25/2018 10:15 AM EST ----- Raymond Welch, please notify pt and schedule with DME for CPAP set-up and f/u sleep clinic 

## 2018-08-09 NOTE — Telephone Encounter (Signed)
-----   Message from Lennette Bihari, MD sent at 07/25/2018 10:15 AM EST ----- Burna Mortimer, please notify pt and schedule with DME for CPAP set-up and f/u sleep clinic

## 2018-09-04 ENCOUNTER — Other Ambulatory Visit: Payer: Self-pay

## 2018-09-04 MED ORDER — LOSARTAN POTASSIUM-HCTZ 100-12.5 MG PO TABS: 1.0000 | ORAL_TABLET | Freq: Every day | ORAL | 0 refills | Status: DC

## 2018-09-13 ENCOUNTER — Encounter: Payer: Self-pay | Admitting: Internal Medicine

## 2018-09-13 ENCOUNTER — Ambulatory Visit (INDEPENDENT_AMBULATORY_CARE_PROVIDER_SITE_OTHER): Payer: Medicare Other | Admitting: Internal Medicine

## 2018-09-13 VITALS — BP 120/92 | HR 69 | Temp 98.0°F | Ht 72.6 in | Wt 295.6 lb

## 2018-09-13 DIAGNOSIS — K649 Unspecified hemorrhoids: Secondary | ICD-10-CM

## 2018-09-13 DIAGNOSIS — E66812 Obesity, class 2: Secondary | ICD-10-CM

## 2018-09-13 DIAGNOSIS — I129 Hypertensive chronic kidney disease with stage 1 through stage 4 chronic kidney disease, or unspecified chronic kidney disease: Secondary | ICD-10-CM

## 2018-09-13 DIAGNOSIS — Z6839 Body mass index (BMI) 39.0-39.9, adult: Secondary | ICD-10-CM

## 2018-09-13 DIAGNOSIS — N182 Chronic kidney disease, stage 2 (mild): Secondary | ICD-10-CM

## 2018-09-13 DIAGNOSIS — Z1211 Encounter for screening for malignant neoplasm of colon: Secondary | ICD-10-CM

## 2018-09-13 MED ORDER — HYDROCORTISONE 2.5 % RE CREA: TOPICAL_CREAM | RECTAL | 1 refills | Status: AC

## 2018-09-13 NOTE — Patient Instructions (Signed)
Hemorrhoids Hemorrhoids are swollen veins that may develop:  In the butt (rectum). These are called internal hemorrhoids.  Around the opening of the butt (anus). These are called external hemorrhoids. Hemorrhoids can cause pain, itching, or bleeding. Most of the time, they do not cause serious problems. They usually get better with diet changes, lifestyle changes, and other home treatments. What are the causes? This condition may be caused by:  Having trouble pooping (constipation).  Pushing hard (straining) to poop.  Watery poop (diarrhea).  Pregnancy.  Being very overweight (obese).  Sitting for long periods of time.  Heavy lifting or other activity that causes you to strain.  Anal sex.  Riding a bike for a long period of time. What are the signs or symptoms? Symptoms of this condition include:  Pain.  Itching or soreness in the butt.  Bleeding from the butt.  Leaking poop.  Swelling in the area.  One or more lumps around the opening of your butt. How is this diagnosed? A doctor can often diagnose this condition by looking at the affected area. The doctor may also:  Do an exam that involves feeling the area with a gloved hand (digital rectal exam).  Examine the area inside your butt using a small tube (anoscope).  Order blood tests. This may be done if you have lost a lot of blood.  Have you get a test that involves looking inside the colon using a flexible tube with a camera on the end (sigmoidoscopy or colonoscopy). How is this treated? This condition can usually be treated at home. Your doctor may tell you to change what you eat, make lifestyle changes, or try home treatments. If these do not help, procedures can be done to remove the hemorrhoids or make them smaller. These may involve:  Placing rubber bands at the base of the hemorrhoids to cut off their blood supply.  Injecting medicine into the hemorrhoids to shrink them.  Shining a type of light  energy onto the hemorrhoids to cause them to fall off.  Doing surgery to remove the hemorrhoids or cut off their blood supply. Follow these instructions at home: Eating and drinking   Eat foods that have a lot of fiber in them. These include whole grains, beans, nuts, fruits, and vegetables.  Ask your doctor about taking products that have added fiber (fibersupplements).  Reduce the amount of fat in your diet. You can do this by: ? Eating low-fat dairy products. ? Eating less red meat. ? Avoiding processed foods.  Drink enough fluid to keep your pee (urine) pale yellow. Managing pain and swelling   Take a warm-water bath (sitz bath) for 20 minutes to ease pain. Do this 3-4 times a day. You may do this in a bathtub or using a portable sitz bath that fits over the toilet.  If told, put ice on the painful area. It may be helpful to use ice between your warm baths. ? Put ice in a plastic bag. ? Place a towel between your skin and the bag. ? Leave the ice on for 20 minutes, 2-3 times a day. General instructions  Take over-the-counter and prescription medicines only as told by your doctor. ? Medicated creams and medicines may be used as told.  Exercise often. Ask your doctor how much and what kind of exercise is best for you.  Go to the bathroom when you have the urge to poop. Do not wait.  Avoid pushing too hard when you poop.  Keep your   butt dry and clean. Use wet toilet paper or moist towelettes after pooping.  Do not sit on the toilet for a long time.  Keep all follow-up visits as told by your doctor. This is important. Contact a doctor if you:  Have pain and swelling that do not get better with treatment or medicine.  Have trouble pooping.  Cannot poop.  Have pain or swelling outside the area of the hemorrhoids. Get help right away if you have:  Bleeding that will not stop. Summary  Hemorrhoids are swollen veins in the butt or around the opening of the butt.   They can cause pain, itching, or bleeding.  Eat foods that have a lot of fiber in them. These include whole grains, beans, nuts, fruits, and vegetables.  Take a warm-water bath (sitz bath) for 20 minutes to ease pain. Do this 3-4 times a day. This information is not intended to replace advice given to you by your health care provider. Make sure you discuss any questions you have with your health care provider. Document Released: 04/27/2008 Document Revised: 12/08/2017 Document Reviewed: 12/08/2017 Elsevier Interactive Patient Education  2019 [employer]  

## 2018-09-13 NOTE — Progress Notes (Signed)
Subjective:     Patient ID: Raymond Welch , male    DOB: 25-Feb-1951 , 68 y.o.   MRN: [id number]   Chief Complaint  Patient presents with  . Hemorrhoids    HPI  He is here today for further evaluation of hemorrhoids. He first noticed them about two weeks ago.  He went to bathroom and noticed blood on tissue, also had pain with seated positions. He does not know what triggered his symptoms. He did have some relief with Preparation-H. He wants to know what should be used in the future.     Past Medical History:  Diagnosis Date  . Hypertension   . Low back pain   . Pure hypercholesterolemia   . Sleep apnea    Doesn't uses CPAP "like he is supposed to"  . Type 2 diabetes mellitus (HCC)      Family History  Problem Relation Age of Onset  . Diabetes Mother   . Heart attack Mother 76       Died age 24  . Aneurysm Father 85  . Lung cancer Brother      Current Outpatient Medications:  .  amLODipine (NORVASC) 5 MG tablet, Take 5 mg by mouth daily., Disp: , Rfl: 1 .  cyclobenzaprine (FLEXERIL) 10 MG tablet, Take 10 mg by mouth at bedtime as needed for muscle pain., Disp: , Rfl: 2 .  glucose blood test strip, 1 each by Other route daily as needed for other (blood sugar). Use as instructed , Disp: , Rfl:  .  losartan-hydrochlorothiazide (HYZAAR) 100-12.5 MG tablet, Take 1 tablet by mouth daily., Disp: 90 tablet, Rfl: 0 .  metoprolol succinate (TOPROL-XL) 50 MG 24 hr tablet, Take 50 mg by mouth daily. Take with or immediately following a meal., Disp: , Rfl:  .  Olopatadine HCl (PAZEO) 0.7 % SOLN, Place 1 drop into both eyes 2 (two) times daily., Disp: , Rfl:  .  ONETOUCH DELICA LANCETS 33G MISC, 1 each by Does not apply route daily as needed (blood sugar). , Disp: , Rfl:  .  oxyCODONE-acetaminophen (PERCOCET) 10-325 MG tablet, Take 1 tablet by mouth every 8 (eight) hours as needed for pain. , Disp: , Rfl:  .  pravastatin (PRAVACHOL) 40 MG tablet, TAKE 1 TABLET BY MOUTH EVERY DAY, Disp: 90  tablet, Rfl: 1 .  tadalafil (CIALIS) 20 MG tablet, Take 20 mg by mouth daily as needed for erectile dysfunction., Disp: , Rfl:    No Known Allergies   Review of Systems  Constitutional: Negative.   Respiratory: Negative.   Cardiovascular: Negative.   Gastrointestinal: Positive for blood in stool.  Neurological: Negative.   Psychiatric/Behavioral: Negative.      Today's Vitals   09/13/18 0927  BP: (!) 120/92  Pulse: 69  Temp: 98 F (36.7 C)  TempSrc: Oral  Weight: 295 lb 9.6 oz (134.1 kg)  Height: 6' 0.6" (1.844 m)  PainSc: 0-No pain   Body mass index is 39.43 kg/m.   Objective:  Physical Exam Vitals signs and nursing note reviewed.  Constitutional:      Appearance: Normal appearance.  HENT:     Head: Normocephalic and atraumatic.  Cardiovascular:     Rate and Rhythm: Normal rate and regular rhythm.     Heart sounds: Normal heart sounds.  Pulmonary:     Effort: Pulmonary effort is normal.     Breath sounds: Normal breath sounds.  Genitourinary:    Comments: He declined rectal exam Skin:    General:  Skin is warm.  Neurological:     General: No focal deficit present.     Mental Status: He is alert.  Psychiatric:        Mood and Affect: Mood normal.         Assessment And Plan:     1. Hemorrhoids, unspecified hemorrhoid type  He was given rx Anusol to use as needed. He will let me know if he has another flare. He declined rectal exam today.   2. Parenchymal renal hypertension, stage 1 through stage 4 or unspecified chronic kidney disease  Fair control. He will continue with current meds for now. Previous BP readings were reviewed, I do not think he needs to have change in meds today.   3. Chronic renal disease, stage II  Chronic, I will check a GFr, Cr at his next visit.   4. Screen for colon cancer  He thinks he is due for a colonoscopy. I will request his records from [facility] to determine when he is due for his next colonoscopy. I did check our old  system, [employer], and we do not seem to have this on file.   5. Class 2 severe obesity due to excess calories with serious comorbidity and body mass index (BMI) of 39.0 to 39.9 in adult [facility])  He was congratulated on his six pound weight loss since Dec 2019. He is encouraged to strive for BMI less than 30 to decrease cardiac risk. Importance of regular exercise was discussed with the patient.   [name], MD

## 2018-10-18 ENCOUNTER — Ambulatory Visit: Payer: Medicare Other | Admitting: Internal Medicine

## 2018-10-26 ENCOUNTER — Other Ambulatory Visit: Payer: Self-pay

## 2018-10-26 ENCOUNTER — Ambulatory Visit (INDEPENDENT_AMBULATORY_CARE_PROVIDER_SITE_OTHER): Payer: Medicare Other | Admitting: Internal Medicine

## 2018-10-26 ENCOUNTER — Encounter: Payer: Self-pay | Admitting: Internal Medicine

## 2018-10-26 VITALS — BP 124/82 | HR 75 | Temp 98.3°F | Ht 72.6 in | Wt 291.8 lb

## 2018-10-26 DIAGNOSIS — R7303 Prediabetes: Secondary | ICD-10-CM | POA: Diagnosis not present

## 2018-10-26 DIAGNOSIS — G4733 Obstructive sleep apnea (adult) (pediatric): Secondary | ICD-10-CM | POA: Diagnosis not present

## 2018-10-26 DIAGNOSIS — I129 Hypertensive chronic kidney disease with stage 1 through stage 4 chronic kidney disease, or unspecified chronic kidney disease: Secondary | ICD-10-CM

## 2018-10-26 DIAGNOSIS — N182 Chronic kidney disease, stage 2 (mild): Secondary | ICD-10-CM | POA: Diagnosis not present

## 2018-10-26 DIAGNOSIS — Z6838 Body mass index (BMI) 38.0-38.9, adult: Secondary | ICD-10-CM

## 2018-10-26 NOTE — Progress Notes (Signed)
Subjective:     Patient ID: Raymond Welch , male    DOB: 27-Feb-1951 , 68 y.o.   MRN: [id number]   Chief Complaint  Patient presents with  . Diabetes  . Hypertension    HPI  He is here today for pre-diabetes follow-up.  He has been taking Ozempic, ran out two weeks ago. He feels that it causes him to gain weight. He does not wish to continue with the medication.   Hypertension  This is a chronic problem. The current episode started more than 1 year ago. The problem has been gradually improving since onset. The problem is controlled. Pertinent negatives include no blurred vision, chest pain, palpitations or shortness of breath.     Past Medical History:  Diagnosis Date  . Hypertension   . Low back pain   . Pure hypercholesterolemia   . Sleep apnea    Doesn't uses CPAP "like he is supposed to"     Family History  Problem Relation Age of Onset  . Diabetes Mother   . Heart attack Mother 67       Died age 10  . Aneurysm Father 92  . Lung cancer Brother      Current Outpatient Medications:  .  amLODipine (NORVASC) 5 MG tablet, Take 5 mg by mouth daily., Disp: , Rfl: 1 .  cyclobenzaprine (FLEXERIL) 10 MG tablet, Take 10 mg by mouth at bedtime as needed for muscle pain., Disp: , Rfl: 2 .  glucose blood test strip, 1 each by Other route daily as needed for other (blood sugar). Use as instructed , Disp: , Rfl:  .  hydrocortisone (ANUSOL-HC) 2.5 % rectal cream, Apply rectally 2 times daily, Disp: 30 g, Rfl: 1 .  losartan-hydrochlorothiazide (HYZAAR) 100-12.5 MG tablet, Take 1 tablet by mouth daily., Disp: 90 tablet, Rfl: 0 .  metoprolol succinate (TOPROL-XL) 50 MG 24 hr tablet, Take 50 mg by mouth daily. Take with or immediately following a meal., Disp: , Rfl:  .  Olopatadine HCl (PAZEO) 0.7 % SOLN, Place 1 drop into both eyes 2 (two) times daily., Disp: , Rfl:  .  ONETOUCH DELICA LANCETS 62U MISC, 1 each by Does not apply route daily as needed (blood sugar). , Disp: , Rfl:  .   oxyCODONE-acetaminophen (PERCOCET) 10-325 MG tablet, Take 1 tablet by mouth every 8 (eight) hours as needed for pain. , Disp: , Rfl:  .  pravastatin (PRAVACHOL) 40 MG tablet, TAKE 1 TABLET BY MOUTH EVERY DAY, Disp: 90 tablet, Rfl: 1 .  tadalafil (CIALIS) 20 MG tablet, Take 20 mg by mouth daily as needed for erectile dysfunction., Disp: , Rfl:  .  Semaglutide,0.25 or 0.5MG/DOS, (OZEMPIC, 0.25 OR 0.5 MG/DOSE,) 2 MG/1.5ML SOPN, Inject into the skin., Disp: , Rfl:    No Known Allergies   Review of Systems  Constitutional: Negative.   Eyes: Negative for blurred vision.  Respiratory: Negative.  Negative for shortness of breath.   Cardiovascular: Negative.  Negative for chest pain and palpitations.  Gastrointestinal: Negative.   Neurological: Negative.   Psychiatric/Behavioral: Negative.      Today's Vitals   10/26/18 1136  BP: 124/82  Pulse: 75  Temp: 98.3 F (36.8 C)  TempSrc: Oral  Weight: 291 lb 12.8 oz (132.4 kg)  Height: 6' 0.6" (1.844 m)  PainSc: 6   PainLoc: Back   Body mass index is 38.92 kg/m.   Objective:  Physical Exam Vitals signs and nursing note reviewed.  Constitutional:  Appearance: Normal appearance.  Cardiovascular:     Rate and Rhythm: Normal rate and regular rhythm.     Heart sounds: Normal heart sounds.  Pulmonary:     Effort: Pulmonary effort is normal.     Breath sounds: Normal breath sounds.  Skin:    General: Skin is warm.     Coloration: Pallor:   Neurological:     General: No focal deficit present.     Mental Status: He is alert.  Psychiatric:        Mood and Affect: Mood normal.         Assessment And Plan:     1. Pre-diabetes  I will check labs as listed below.  Importance of dietary compliance was discussed with the patient. He is encouraged to exercise no less than 30 minutes five days weekly.   - TSH - Hemoglobin A1c - Insulin, random(561) - BMP8+EGFR  2. Parenchymal renal hypertension, stage 1 through stage 4 or  unspecified chronic kidney disease  Well controlled. He will continue with current meds. He is encouraged to avoid adding salt to his foods.   3. Chronic renal disease, stage II  Chronic. I will check GFr, CR today. Importance of optimal bp control to prevent progression of disease was stressed to the patient.   4. Obstructive sleep apnea  Chronic. Importance of CPAP compliance was discussed with the patient. He is encouraged to wear at least four hours per night, each and every night. He admits to improved well-being when he is compliant with CPAP.   5. Class 2 severe obesity due to excess calories with serious comorbidity and body mass index (BMI) of 38.0 to 38.9 in adult [facility])  Importance of achieving optimal weight to decrease risk of cardiovascular disease and cancers was discussed with the patient in full detail. He is encouraged to start slowly - start with 10 minutes twice daily at least three to four days per week and to gradually build to 30 minutes five days weekly. He was given tips to incorporate more activity into her daily routine - take stairs when possible, park farther away from grocery stores, etc.     [name], MD

## 2018-10-26 NOTE — Patient Instructions (Signed)

## 2018-10-27 LAB — TSH: TSH: 1.09 u[IU]/mL (ref 0.450–4.500)

## 2018-10-27 LAB — HEMOGLOBIN A1C
Est. average glucose Bld gHb Est-mCnc: 114 mg/dL
Hgb A1c MFr Bld: 5.6 % (ref 4.8–5.6)

## 2018-10-27 LAB — BMP8+EGFR
BUN/Creatinine Ratio: 12 (ref 10–24)
BUN: 15 mg/dL (ref 8–27)
CO2: 24 mmol/L (ref 20–29)
Calcium: 10.2 mg/dL (ref 8.6–10.2)
Chloride: 101 mmol/L (ref 96–106)
Creatinine, Ser: 1.26 mg/dL (ref 0.76–1.27)
GFR calc Af Amer: 67 mL/min/{1.73_m2} (ref >59)
GFR calc non Af Amer: 58 mL/min/{1.73_m2} — ABNORMAL LOW (ref >59)
Glucose: 106 mg/dL — ABNORMAL HIGH (ref 65–99)
Potassium: 4.3 mmol/L (ref 3.5–5.2)
Sodium: 141 mmol/L (ref 134–144)

## 2018-10-27 LAB — INSULIN, RANDOM: INSULIN: 47.7 u[IU]/mL — ABNORMAL HIGH (ref 2.6–24.9)

## 2018-11-21 DIAGNOSIS — M961 Postlaminectomy syndrome, not elsewhere classified: Secondary | ICD-10-CM | POA: Diagnosis not present

## 2018-11-28 ENCOUNTER — Other Ambulatory Visit: Payer: Self-pay | Admitting: Internal Medicine

## 2018-12-21 DIAGNOSIS — M961 Postlaminectomy syndrome, not elsewhere classified: Secondary | ICD-10-CM | POA: Diagnosis not present

## 2018-12-21 DIAGNOSIS — M5136 Other intervertebral disc degeneration, lumbar region: Secondary | ICD-10-CM | POA: Diagnosis not present

## 2018-12-26 ENCOUNTER — Other Ambulatory Visit: Payer: Self-pay | Admitting: Internal Medicine

## 2019-01-30 ENCOUNTER — Other Ambulatory Visit: Payer: Self-pay

## 2019-01-30 ENCOUNTER — Ambulatory Visit (INDEPENDENT_AMBULATORY_CARE_PROVIDER_SITE_OTHER): Payer: Medicare Other | Admitting: Internal Medicine

## 2019-01-30 ENCOUNTER — Encounter: Payer: Self-pay | Admitting: Internal Medicine

## 2019-01-30 VITALS — BP 124/82 | HR 71 | Temp 98.3°F | Ht 68.4 in | Wt 297.0 lb

## 2019-01-30 DIAGNOSIS — N182 Chronic kidney disease, stage 2 (mild): Secondary | ICD-10-CM | POA: Diagnosis not present

## 2019-01-30 DIAGNOSIS — G4733 Obstructive sleep apnea (adult) (pediatric): Secondary | ICD-10-CM

## 2019-01-30 DIAGNOSIS — I420 Dilated cardiomyopathy: Secondary | ICD-10-CM | POA: Diagnosis not present

## 2019-01-30 DIAGNOSIS — I129 Hypertensive chronic kidney disease with stage 1 through stage 4 chronic kidney disease, or unspecified chronic kidney disease: Secondary | ICD-10-CM | POA: Diagnosis not present

## 2019-01-30 DIAGNOSIS — R7303 Prediabetes: Secondary | ICD-10-CM | POA: Diagnosis not present

## 2019-01-30 DIAGNOSIS — Z9989 Dependence on other enabling machines and devices: Secondary | ICD-10-CM

## 2019-01-30 DIAGNOSIS — Z6841 Body Mass Index (BMI) 40.0 and over, adult: Secondary | ICD-10-CM

## 2019-01-30 NOTE — Patient Instructions (Signed)

## 2019-02-05 ENCOUNTER — Other Ambulatory Visit: Payer: Self-pay

## 2019-02-05 MED ORDER — METOPROLOL SUCCINATE ER 50 MG PO TB24: 50.0000 mg | ORAL_TABLET | Freq: Every day | ORAL | 3 refills | Status: DC

## 2019-02-09 DIAGNOSIS — M5136 Other intervertebral disc degeneration, lumbar region: Secondary | ICD-10-CM | POA: Diagnosis not present

## 2019-02-09 DIAGNOSIS — M542 Cervicalgia: Secondary | ICD-10-CM | POA: Diagnosis not present

## 2019-02-09 DIAGNOSIS — I10 Essential (primary) hypertension: Secondary | ICD-10-CM | POA: Diagnosis not present

## 2019-02-11 NOTE — Progress Notes (Signed)
Subjective:     Patient ID: Raymond Welch , male    DOB: 10-Sep-1950 , 68 y.o.   MRN: [id number]   Chief Complaint  Patient presents with  . Hypertension    HPI  Hypertension This is a chronic problem. The current episode started more than 1 year ago. The problem has been gradually improving since onset. The problem is controlled. Pertinent negatives include no blurred vision, chest pain, palpitations or shortness of breath. Risk factors for coronary artery disease include dyslipidemia, obesity, sedentary lifestyle and male gender.     Past Medical History:  Diagnosis Date  . Hypertension   . Low back pain   . Pure hypercholesterolemia   . Sleep apnea    Doesn't uses CPAP "like he is supposed to"     Family History  Problem Relation Age of Onset  . Diabetes Mother   . Heart attack Mother 20       Died age 24  . Aneurysm Father 57  . Lung cancer Brother      Current Outpatient Medications:  .  amLODipine (NORVASC) 5 MG tablet, Take 5 mg by mouth daily., Disp: , Rfl: 1 .  cyclobenzaprine (FLEXERIL) 10 MG tablet, Take 10 mg by mouth at bedtime as needed for muscle pain., Disp: , Rfl: 2 .  glucose blood test strip, 1 each by Other route daily as needed for other (blood sugar). Use as instructed , Disp: , Rfl:  .  hydrocortisone (ANUSOL-HC) 2.5 % rectal cream, Apply rectally 2 times daily, Disp: 30 g, Rfl: 1 .  losartan-hydrochlorothiazide (HYZAAR) 100-12.5 MG tablet, TAKE 1 TABLET BY MOUTH EVERY DAY, Disp: 90 tablet, Rfl: 0 .  Olopatadine HCl (PAZEO) 0.7 % SOLN, Place 1 drop into both eyes 2 (two) times daily., Disp: , Rfl:  .  ONETOUCH DELICA LANCETS 96E MISC, 1 each by Does not apply route daily as needed (blood sugar). , Disp: , Rfl:  .  oxyCODONE-acetaminophen (PERCOCET) 10-325 MG tablet, Take 1 tablet by mouth every 8 (eight) hours as needed for pain. , Disp: , Rfl:  .  pravastatin (PRAVACHOL) 40 MG tablet, TAKE 1 TABLET BY MOUTH EVERY DAY, Disp: 90 tablet, Rfl: 1 .   tadalafil (CIALIS) 20 MG tablet, Take 20 mg by mouth daily as needed for erectile dysfunction., Disp: , Rfl:  .  metoprolol succinate (TOPROL-XL) 50 MG 24 hr tablet, Take 1 tablet (50 mg total) by mouth daily. Take with or immediately following a meal., Disp: 90 tablet, Rfl: 3 .  Semaglutide,0.25 or 0.5MG/DOS, (OZEMPIC, 0.25 OR 0.5 MG/DOSE,) 2 MG/1.5ML SOPN, Inject into the skin. , Disp: , Rfl:    No Known Allergies   Review of Systems  Constitutional: Negative.   Eyes: Negative for blurred vision.  Respiratory: Negative.  Negative for shortness of breath.   Cardiovascular: Negative.  Negative for chest pain and palpitations.  Gastrointestinal: Negative.   Neurological: Negative.   Psychiatric/Behavioral: Negative.      Today's Vitals   01/30/19 0928  BP: 124/82  Pulse: 71  Temp: 98.3 F (36.8 C)  TempSrc: Oral  Weight: 297 lb (134.7 kg)  Height: 5' 8.4" (1.737 m)  PainSc: 7   PainLoc: Back   Body mass index is 44.63 kg/m.   Objective:  Physical Exam Vitals signs and nursing note reviewed.  Constitutional:      Appearance: Normal appearance.  Cardiovascular:     Rate and Rhythm: Normal rate and regular rhythm.     Heart sounds: Normal  heart sounds.  Pulmonary:     Effort: Pulmonary effort is normal.     Breath sounds: Normal breath sounds.  Skin:    General: Skin is warm.  Neurological:     General: No focal deficit present.     Mental Status: He is alert.  Psychiatric:        Mood and Affect: Mood normal.         Assessment And Plan:     1. Parenchymal renal hypertension, stage 1 through stage 4 or unspecified chronic kidney disease  Well controlled. He will continue with current meds. He is encouraged to avoid adding salt to his foods.   - Lipid panel - CMP14+EGFR  2. Chronic renal disease, stage II  Chronic. I will check a GFR, Cr today. He is encouraged to stay well hydrated.   3. Pre-diabetes  HIS A1C HAS BEEN ELEVATED IN THE PAST. I WILL CHECK  AN A1C, BMET TODAY. HE WAS ENCOURAGED TO AVOID SUGARY BEVERAGES AND PROCESSED FOODS INCLUDNG BREADS, RICE AND PASTA.  - Hemoglobin A1c  4. Dilated cardiomyopathy (HCC)  Chronic, yet stable. Importance of salt restriction was stressed to the patient. He is encouraged to increase daily activity as tolerated.   5. OSA on CPAP  Chronic. He is encouraged to wear at least six hours per night. He has noticed improvement in his energy levels since starting use of CPAP.   6. Class 3 severe obesity due to excess calories with serious comorbidity and body mass index (BMI) of 40.0 to 44.9 in adult [facility])  Importance of achieving optimal weight to decrease risk of cardiovascular disease and cancers was discussed with the patient in full detail. He is encouraged to start slowly - start with 10 minutes twice daily at least three to four days per week and to gradually build to 30 minutes five days weekly. She was given tips to incorporate more activity into his daily routine - take stairs when possible, park farther away from grocery stores, etc.    [name], MD    THE PATIENT IS ENCOURAGED TO PRACTICE SOCIAL DISTANCING DUE TO THE COVID-19 PANDEMIC.

## 2019-02-12 ENCOUNTER — Encounter: Payer: Self-pay | Admitting: Internal Medicine

## 2019-02-12 ENCOUNTER — Telehealth: Payer: Self-pay

## 2019-02-12 DIAGNOSIS — I129 Hypertensive chronic kidney disease with stage 1 through stage 4 chronic kidney disease, or unspecified chronic kidney disease: Secondary | ICD-10-CM | POA: Diagnosis not present

## 2019-02-12 DIAGNOSIS — R7303 Prediabetes: Secondary | ICD-10-CM | POA: Diagnosis not present

## 2019-02-12 NOTE — Telephone Encounter (Signed)
Left the pt a message for the pt to return to the office for blood work that he didn't have done the day of his visit.

## 2019-02-13 LAB — CMP14+EGFR
ALT: 16 IU/L (ref 0–44)
AST: 22 IU/L (ref 0–40)
Albumin/Globulin Ratio: 1.4 (ref 1.2–2.2)
Albumin: 4.2 g/dL (ref 3.8–4.8)
Alkaline Phosphatase: 51 IU/L (ref 39–117)
BUN/Creatinine Ratio: 10 (ref 10–24)
BUN: 13 mg/dL (ref 8–27)
Bilirubin Total: 0.4 mg/dL (ref 0.0–1.2)
CO2: 24 mmol/L (ref 20–29)
Calcium: 9.8 mg/dL (ref 8.6–10.2)
Chloride: 103 mmol/L (ref 96–106)
Creatinine, Ser: 1.31 mg/dL — ABNORMAL HIGH (ref 0.76–1.27)
GFR calc Af Amer: 64 mL/min/{1.73_m2} (ref >59)
GFR calc non Af Amer: 56 mL/min/{1.73_m2} — ABNORMAL LOW (ref >59)
Globulin, Total: 3 g/dL (ref 1.5–4.5)
Glucose: 108 mg/dL — ABNORMAL HIGH (ref 65–99)
Potassium: 4 mmol/L (ref 3.5–5.2)
Sodium: 142 mmol/L (ref 134–144)
Total Protein: 7.2 g/dL (ref 6.0–8.5)

## 2019-02-13 LAB — HEMOGLOBIN A1C
Est. average glucose Bld gHb Est-mCnc: 131 mg/dL
Hgb A1c MFr Bld: 6.2 % — ABNORMAL HIGH (ref 4.8–5.6)

## 2019-02-13 LAB — LIPID PANEL
Chol/HDL Ratio: 3.9 ratio (ref 0.0–5.0)
Cholesterol, Total: 160 mg/dL (ref 100–199)
HDL: 41 mg/dL (ref >39)
LDL Calculated: 84 mg/dL (ref 0–99)
Triglycerides: 177 mg/dL — ABNORMAL HIGH (ref 0–149)
VLDL Cholesterol Cal: 35 mg/dL (ref 5–40)

## 2019-02-19 DIAGNOSIS — E119 Type 2 diabetes mellitus without complications: Secondary | ICD-10-CM | POA: Diagnosis not present

## 2019-02-19 DIAGNOSIS — H25813 Combined forms of age-related cataract, bilateral: Secondary | ICD-10-CM | POA: Diagnosis not present

## 2019-02-19 DIAGNOSIS — H40053 Ocular hypertension, bilateral: Secondary | ICD-10-CM | POA: Diagnosis not present

## 2019-02-19 DIAGNOSIS — H16223 Keratoconjunctivitis sicca, not specified as Sjogren's, bilateral: Secondary | ICD-10-CM | POA: Diagnosis not present

## 2019-02-19 DIAGNOSIS — H10413 Chronic giant papillary conjunctivitis, bilateral: Secondary | ICD-10-CM | POA: Diagnosis not present

## 2019-02-19 LAB — HM DIABETES EYE EXAM

## 2019-02-21 ENCOUNTER — Other Ambulatory Visit: Payer: Self-pay | Admitting: Internal Medicine

## 2019-02-23 ENCOUNTER — Encounter: Payer: Self-pay | Admitting: Internal Medicine

## 2019-03-08 DIAGNOSIS — G4733 Obstructive sleep apnea (adult) (pediatric): Secondary | ICD-10-CM | POA: Diagnosis not present

## 2019-03-13 ENCOUNTER — Other Ambulatory Visit: Payer: Self-pay

## 2019-03-13 MED ORDER — LOSARTAN POTASSIUM-HCTZ 100-12.5 MG PO TABS: 1.0000 | ORAL_TABLET | Freq: Every day | ORAL | 2 refills | Status: DC

## 2019-03-13 MED ORDER — METOPROLOL SUCCINATE ER 50 MG PO TB24: 50.0000 mg | ORAL_TABLET | Freq: Every day | ORAL | 2 refills | Status: DC

## 2019-03-13 MED ORDER — PRAVASTATIN SODIUM 40 MG PO TABS: 40.0000 mg | ORAL_TABLET | Freq: Every day | ORAL | 2 refills | Status: DC

## 2019-05-02 ENCOUNTER — Ambulatory Visit: Payer: Medicare Other

## 2019-05-02 ENCOUNTER — Other Ambulatory Visit: Payer: Self-pay

## 2019-05-02 ENCOUNTER — Ambulatory Visit (INDEPENDENT_AMBULATORY_CARE_PROVIDER_SITE_OTHER): Payer: Medicare Other

## 2019-05-02 VITALS — BP 132/82 | HR 82 | Temp 97.7°F | Ht 73.8 in | Wt 301.6 lb

## 2019-05-02 DIAGNOSIS — Z0000 Encounter for general adult medical examination without abnormal findings: Secondary | ICD-10-CM | POA: Diagnosis not present

## 2019-05-02 DIAGNOSIS — I10 Essential (primary) hypertension: Secondary | ICD-10-CM | POA: Diagnosis not present

## 2019-05-02 LAB — POCT URINALYSIS DIPSTICK
Bilirubin, UA: NEGATIVE
Blood, UA: NEGATIVE
Glucose, UA: NEGATIVE
Ketones, UA: NEGATIVE
Leukocytes, UA: NEGATIVE
Nitrite, UA: NEGATIVE
Protein, UA: NEGATIVE
Spec Grav, UA: 1.025 (ref 1.010–1.025)
Urobilinogen, UA: 0.2 E.U./dL
pH, UA: 6 (ref 5.0–8.0)

## 2019-05-02 LAB — POCT UA - MICROALBUMIN
Albumin/Creatinine Ratio, Urine, POC: <30
Creatinine, POC: 300 mg/dL
Microalbumin Ur, POC: 10 mg/L

## 2019-05-02 NOTE — Progress Notes (Signed)
Subjective:   Raymond Welch is a 68 y.o. male who presents for Medicare Annual/Subsequent preventive examination.  Review of Systems:  n/a Cardiac Risk Factors include: advanced age (>49men, >58 women);sedentary lifestyle;hypertension;dyslipidemia;male gender     Objective:    Vitals: BP 132/82 (BP Location: Left Arm, Patient Position: Sitting, Cuff Size: Large)   Pulse 82   Temp 97.7 F (36.5 C) (Oral)   Ht 6' 1.8" (1.875 m)   Wt (!) 301 lb 9.6 oz (136.8 kg)   SpO2 94%   BMI 38.93 kg/m   Body mass index is 38.93 kg/m.  Advanced Directives 05/02/2019 07/19/2018 06/08/2018 12/13/2017 07/24/2017 07/22/2017 02/17/2016  Does Patient Have a Medical Advance Directive? No No No No No No No  Would patient like information on creating a medical advance directive? No - Patient declined Yes (MAU/Ambulatory/Procedural Areas - Information given) No - Patient declined No - Patient declined - - -    Tobacco Social History   Tobacco Use  Smoking Status Former Smoker  . Packs/day: 0.25  . Years: 40.00  . Pack years: 10.00  . Types: Cigarettes  Smokeless Tobacco Never Used  Tobacco Comment   Quit 8 years ago.      Counseling given: Not Answered Comment: Quit 8 years ago.    Clinical Intake:  Pre-visit preparation completed: Yes  Pain : 0-10 Pain Score: 8  Pain Type: Chronic pain Pain Location: Back Pain Orientation: Lower Pain Radiating Towards: down legs at times Pain Descriptors / Indicators: Burning, Aching, Shooting Pain Onset: More than a month ago Pain Frequency: Constant Pain Relieving Factors: percocet eases the pain some  Pain Relieving Factors: percocet eases the pain some  Nutritional Status: BMI > 30  Obese Nutritional Risks: None Diabetes: No  How often do you need to have someone help you when you read instructions, pamphlets, or other written materials from your doctor or pharmacy?: 1 - Never What is the last grade level you completed in school?: 7th grade  Interpreter Needed?: No  Information entered by :: NAllen LPN  Past Medical History:  Diagnosis Date  . Hypertension   . Low back pain   . Pure hypercholesterolemia   . Sleep apnea    Doesn't uses CPAP "like he is supposed to"   Past Surgical History:  Procedure Laterality Date  . BACK SURGERY    . LEFT HEART CATH AND CORONARY ANGIOGRAPHY N/A 12/13/2017   Procedure: LEFT HEART CATH AND CORONARY ANGIOGRAPHY;  Surgeon: Tonny Bollman, MD;  Location: [facility] CV LAB;  Service: Cardiovascular;  Laterality: N/A;   Family History  Problem Relation Age of Onset  . Diabetes Mother   . Heart attack Mother 67       Died age 80  . Aneurysm Father 11  . Lung cancer Brother    Social History   Socioeconomic History  . Marital status: Married    Spouse name: Not on file  . Number of children: 8  . Years of education: Not on file  . Highest education level: Not on file  Occupational History  . Occupation: Location manager  . Occupation: retired  Engineer, production  . Financial resource strain: Not hard at all  . Food insecurity    Worry: Never true    Inability: Never true  . Transportation needs    Medical: No    Non-medical: No  Tobacco Use  . Smoking status: Former Smoker    Packs/day: 0.25    Years: 40.00  Pack years: 10.00    Types: Cigarettes  . Smokeless tobacco: Never Used  . Tobacco comment: Quit 8 years ago.   Substance and Sexual Activity  . Alcohol use: No  . Drug use: Yes    Types: Oxycodone  . Sexual activity: Yes  Lifestyle  . Physical activity    Days per week: 0 days    Minutes per session: 0 min  . Stress: Not at all  Relationships  . Social Herbalist on phone: Not on file    Gets together: Not on file    Attends religious service: Not on file    Active member of club or organization: Not on file    Attends meetings of clubs or organizations: Not on file    Relationship status: Not on file  Other Topics Concern  . Not on file   Social History Narrative   Lives at home with wife.      Outpatient Encounter Medications as of 05/02/2019  Medication Sig  . amLODipine (NORVASC) 5 MG tablet Take 5 mg by mouth daily.  . cyclobenzaprine (FLEXERIL) 10 MG tablet Take 10 mg by mouth at bedtime as needed for muscle pain.  Marland Kitchen glucose blood test strip 1 each by Other route daily as needed for other (blood sugar). Use as instructed   . losartan-hydrochlorothiazide (HYZAAR) 100-12.5 MG tablet Take 1 tablet by mouth daily.  . metoprolol succinate (TOPROL-XL) 50 MG 24 hr tablet Take 1 tablet (50 mg total) by mouth daily. Take with or immediately following a meal.  . Olopatadine HCl (PAZEO) 0.7 % SOLN Place 1 drop into both eyes 2 (two) times daily.  Glory Rosebush DELICA LANCETS 62V MISC 1 each by Does not apply route daily as needed (blood sugar).   Marland Kitchen oxyCODONE-acetaminophen (PERCOCET) 10-325 MG tablet Take 1 tablet by mouth every 8 (eight) hours as needed for pain.   . pravastatin (PRAVACHOL) 40 MG tablet Take 1 tablet (40 mg total) by mouth daily.  . tadalafil (CIALIS) 20 MG tablet Take 20 mg by mouth daily as needed for erectile dysfunction.  . hydrocortisone (ANUSOL-HC) 2.5 % rectal cream Apply rectally 2 times daily (Patient not taking: Reported on 05/02/2019)  . Semaglutide,0.25 or 0.5MG /DOS, (OZEMPIC, 0.25 OR 0.5 MG/DOSE,) 2 MG/1.5ML SOPN Inject into the skin.    No facility-administered encounter medications on file as of 05/02/2019.     Activities of Daily Living In your present state of health, do you have any difficulty performing the following activities: 05/02/2019 07/19/2018  Hearing? N N  Vision? Y Y  Comment sometimes gets blurry due to cataracts eyes get blurry at times  Difficulty concentrating or making decisions? N N  Walking or climbing stairs? N N  Dressing or bathing? N N  Doing errands, shopping? N N  Preparing Food and eating ? N N  Using the Toilet? N N  In the past six months, have you accidently leaked  urine? N N  Do you have problems with loss of bowel control? N N  Managing your Medications? N N  Managing your Finances? N N  Housekeeping or managing your Housekeeping? N N  Some recent data might be hidden    Patient Care Team: Glendale Chard, MD as PCP - General (Internal Medicine)   Assessment:   This is a routine wellness examination for Raymond Welch.  Exercise Activities and Dietary recommendations Current Exercise Habits: The patient does not participate in regular exercise at present  Goals    .  Patient Stated     05/02/2019, no goals       Fall Risk Fall Risk  05/02/2019 01/30/2019 09/13/2018 07/19/2018 06/06/2018  Falls in the past year? 0 0 0 0 0  Number falls in past yr: 0 - - - -  Risk for fall due to : Medication side effect - - - -  Follow up Falls evaluation completed;Education provided;Falls prevention discussed - - - -   Is the patient's home free of loose throw rugs in walkways, pet beds, electrical cords, etc?   yes      Grab bars in the bathroom? no      Handrails on the stairs?   yes      Adequate lighting?   yes  Timed Get Up and Go Performed: n/a  Depression Screen PHQ 2/9 Scores 05/02/2019 01/30/2019 09/13/2018 07/19/2018  PHQ - 2 Score 0 0 0 0  PHQ- 9 Score 0 - - 0    Cognitive Function     6CIT Screen 05/02/2019 07/19/2018  What Year? 0 points 0 points  What month? 0 points 0 points  What time? 0 points 0 points  Count back from 20 0 points 0 points  Months in reverse 4 points 2 points  Repeat phrase 2 points 2 points  Total Score 6 4     There is no immunization history on file for this patient.  Qualifies for Shingles Vaccine? yes  Screening Tests Health Maintenance  Topic Date Due  . Hepatitis C Screening  29-Mar-1951  . FOOT EXAM  02/15/1961  . PNA vac Low Risk Adult (1 of 2 - PCV13) 07/20/2019 (Originally 02/15/2015)  . INFLUENZA VACCINE  10/31/2019 (Originally 03/03/2019)  . HEMOGLOBIN A1C  08/15/2019  . OPHTHALMOLOGY EXAM  02/19/2020   . COLONOSCOPY  10/02/2023  . TETANUS/TDAP  02/14/2025   Cancer Screenings: Lung: Low Dose CT Chest recommended if Age 52-80 years, 30 pack-year currently smoking OR have quit w/in 15years. Patient does not qualify. Colorectal: up to date  Additional Screenings:  Hepatitis C Screening: due      Plan:    Patient has no goals at this time.  I have personally reviewed and noted the following in the patient's chart:   . Medical and social history . Use of alcohol, tobacco or illicit drugs  . Current medications and supplements . Functional ability and status . Nutritional status . Physical activity . Advanced directives . List of other physicians . Hospitalizations, surgeries, and ER visits in previous 12 months . Vitals . Screenings to include cognitive, depression, and falls . Referrals and appointments  In addition, I have reviewed and discussed with patient certain preventive protocols, quality metrics, and best practice recommendations. A written personalized care plan for preventive services as well as general preventive health recommendations were provided to patient.     Barb Merinoickeah E Mireille Lacombe, LPN  5/28/41329/30/2020

## 2019-05-02 NOTE — Patient Instructions (Signed)
[name] , Thank you for taking time to come for your Medicare Wellness Visit. I appreciate your ongoing commitment to your health goals. Please review the following plan we discussed and let me know if I can assist you in the future.   Screening recommendations/referrals: Colonoscopy: 09/2013 Recommended yearly ophthalmology/optometry visit for glaucoma screening and checkup Recommended yearly dental visit for hygiene and checkup  Vaccinations: Influenza vaccine: decline Pneumococcal vaccine: decline Tdap vaccine: 01/2015 Shingles vaccine: discussed    Advanced directives: Advance directive discussed with you today. Even though you declined this today please call our office should you change your mind and we can give you the proper paperwork for you to fill out.   Conditions/risks identified: obesity  Next appointment: 07/17/2019 at 3:00  Preventive Care 35 Years and Older, Male Preventive care refers to lifestyle choices and visits with your health care provider that can promote health and wellness. What does preventive care include?  A yearly physical exam. This is also called an annual well check.  Dental exams once or twice a year.  Routine eye exams. Ask your health care provider how often you should have your eyes checked.  Personal lifestyle choices, including:  Daily care of your teeth and gums.  Regular physical activity.  Eating a healthy diet.  Avoiding tobacco and drug use.  Limiting alcohol use.  Practicing safe sex.  Taking low doses of aspirin every day.  Taking vitamin and mineral supplements as recommended by your health care provider. What happens during an annual well check? The services and screenings done by your health care provider during your annual well check will depend on your age, overall health, lifestyle risk factors, and family history of disease. Counseling  Your health care provider may ask you questions about your:  Alcohol use.   Tobacco use.  Drug use.  Emotional well-being.  Home and relationship well-being.  Sexual activity.  Eating habits.  History of falls.  Memory and ability to understand (cognition).  Work and work Statistician. Screening  You may have the following tests or measurements:  Height, weight, and BMI.  Blood pressure.  Lipid and cholesterol levels. These may be checked every 5 years, or more frequently if you are over 75 years old.  Skin check.  Lung cancer screening. You may have this screening every year starting at age 21 if you have a 30-pack-year history of smoking and currently smoke or have quit within the past 15 years.  Fecal occult blood test (FOBT) of the stool. You may have this test every year starting at age 96.  Flexible sigmoidoscopy or colonoscopy. You may have a sigmoidoscopy every 5 years or a colonoscopy every 10 years starting at age 64.  Prostate cancer screening. Recommendations will vary depending on your family history and other risks.  Hepatitis C blood test.  Hepatitis B blood test.  Sexually transmitted disease (STD) testing.  Diabetes screening. This is done by checking your blood sugar (glucose) after you have not eaten for a while (fasting). You may have this done every 1-3 years.  Abdominal aortic aneurysm (AAA) screening. You may need this if you are a current or former smoker.  Osteoporosis. You may be screened starting at age 7 if you are at high risk. Talk with your health care provider about your test results, treatment options, and if necessary, the need for more tests. Vaccines  Your health care provider may recommend certain vaccines, such as:  Influenza vaccine. This is recommended every year.  Tetanus, diphtheria, and acellular pertussis (Tdap, Td) vaccine. You may need a Td booster every 10 years.  Zoster vaccine. You may need this after age 82.  Pneumococcal 13-valent conjugate (PCV13) vaccine. One dose is recommended  after age 74.  Pneumococcal polysaccharide (PPSV23) vaccine. One dose is recommended after age 59. Talk to your health care provider about which screenings and vaccines you need and how often you need them. This information is not intended to replace advice given to you by your health care provider. Make sure you discuss any questions you have with your health care provider. Document Released: 08/15/2015 Document Revised: 04/07/2016 Document Reviewed: 05/20/2015 Elsevier Interactive Patient Education  2017 [place] Prevention in the Home Falls can cause injuries. They can happen to people of all ages. There are many things you can do to make your home safe and to help prevent falls. What can I do on the outside of my home?  Regularly fix the edges of walkways and driveways and fix any cracks.  Remove anything that might make you trip as you walk through a door, such as a raised step or threshold.  Trim any bushes or trees on the path to your home.  Use bright outdoor lighting.  Clear any walking paths of anything that might make someone trip, such as rocks or tools.  Regularly check to see if handrails are loose or broken. Make sure that both sides of any steps have handrails.  Any raised decks and porches should have guardrails on the edges.  Have any leaves, snow, or ice cleared regularly.  Use sand or salt on walking paths during winter.  Clean up any spills in your garage right away. This includes oil or grease spills. What can I do in the bathroom?  Use night lights.  Install grab bars by the toilet and in the tub and shower. Do not use towel bars as grab bars.  Use non-skid mats or decals in the tub or shower.  If you need to sit down in the shower, use a plastic, non-slip stool.  Keep the floor dry. Clean up any water that spills on the floor as soon as it happens.  Remove soap buildup in the tub or shower regularly.  Attach bath mats securely with  double-sided non-slip rug tape.  Do not have throw rugs and other things on the floor that can make you trip. What can I do in the bedroom?  Use night lights.  Make sure that you have a light by your bed that is easy to reach.  Do not use any sheets or blankets that are too big for your bed. They should not hang down onto the floor.  Have a firm chair that has side arms. You can use this for support while you get dressed.  Do not have throw rugs and other things on the floor that can make you trip. What can I do in the kitchen?  Clean up any spills right away.  Avoid walking on wet floors.  Keep items that you use a lot in easy-to-reach places.  If you need to reach something above you, use a strong step stool that has a grab bar.  Keep electrical cords out of the way.  Do not use floor polish or wax that makes floors slippery. If you must use wax, use non-skid floor wax.  Do not have throw rugs and other things on the floor that can make you trip. What can I do  with my stairs?  Do not leave any items on the stairs.  Make sure that there are handrails on both sides of the stairs and use them. Fix handrails that are broken or loose. Make sure that handrails are as long as the stairways.  Check any carpeting to make sure that it is firmly attached to the stairs. Fix any carpet that is loose or worn.  Avoid having throw rugs at the top or bottom of the stairs. If you do have throw rugs, attach them to the floor with carpet tape.  Make sure that you have a light switch at the top of the stairs and the bottom of the stairs. If you do not have them, ask someone to add them for you. What else can I do to help prevent falls?  Wear shoes that:  Do not have high heels.  Have rubber bottoms.  Are comfortable and fit you well.  Are closed at the toe. Do not wear sandals.  If you use a stepladder:  Make sure that it is fully opened. Do not climb a closed stepladder.  Make  sure that both sides of the stepladder are locked into place.  Ask someone to hold it for you, if possible.  Clearly mark and make sure that you can see:  Any grab bars or handrails.  First and last steps.  Where the edge of each step is.  Use tools that help you move around (mobility aids) if they are needed. These include:  Canes.  Walkers.  Scooters.  Crutches.  Turn on the lights when you go into a dark area. Replace any light bulbs as soon as they burn out.  Set up your furniture so you have a clear path. Avoid moving your furniture around.  If any of your floors are uneven, fix them.  If there are any pets around you, be aware of where they are.  Review your medicines with your doctor. Some medicines can make you feel dizzy. This can increase your chance of falling. Ask your doctor what other things that you can do to help prevent falls. This information is not intended to replace advice given to you by your health care provider. Make sure you discuss any questions you have with your health care provider. Document Released: 05/15/2009 Document Revised: 12/25/2015 Document Reviewed: 08/23/2014 Elsevier Interactive Patient Education  2017 [employer].

## 2019-05-02 NOTE — Addendum Note (Signed)
Addended by: Glenna Durand E on: 05/02/2019 12:48 PM   Modules accepted: Orders

## 2019-05-07 DIAGNOSIS — M542 Cervicalgia: Secondary | ICD-10-CM | POA: Diagnosis not present

## 2019-05-07 DIAGNOSIS — I10 Essential (primary) hypertension: Secondary | ICD-10-CM | POA: Diagnosis not present

## 2019-05-07 DIAGNOSIS — M961 Postlaminectomy syndrome, not elsewhere classified: Secondary | ICD-10-CM | POA: Diagnosis not present

## 2019-05-09 NOTE — Progress Notes (Signed)
Virtual Visit via Video Note   This visit type was conducted due to national recommendations for restrictions regarding the COVID-19 Pandemic (e.g. social distancing) in an effort to limit this patient's exposure and mitigate transmission in our community.  Due to his co-morbid illnesses, this patient is at least at moderate risk for complications without adequate follow up.  This format is felt to be most appropriate for this patient at this time.  The patient did not have access to video technology/had technical difficulties with video requiring transitioning to audio format only (telephone).  All issues noted in this document were discussed and addressed.  No physical exam could be performed with this format.  Please refer to the patient's chart for his  consent to telehealth for [facility].   Date:  05/10/2019   ID:  Raymond Welch, DOB Jan 04, 1951, MRN [id number]  Patient Location: Home Provider Location: Home  PCP:  Glendale Chard, MD  Cardiologist:  Minus Breeding, MD  Electrophysiologist:  None   Evaluation Performed:  Follow-Up Visit  Chief Complaint:  Cardiomyopathy  History of Present Illness:    Raymond Welch is a 68 y.o. male who was referred by Glendale Chard, MD for evaluation of chest pain. We saw him in May 2018 for chest pain, he had a negative POET.   However he presented this year with continued pain and DOE.    Cath demonstrated mild diffuse CAD.  However, he does have a mildly reduced EF of 45%.    Since I last saw him he has done well.  He denies any cardiovascular symptoms.  The patient denies any new symptoms such as chest discomfort, neck or arm discomfort. There has been no new shortness of breath, PND or orthopnea. There have been no reported palpitations, presyncope or syncope.    The patient does not have symptoms concerning for COVID-19 infection (fever, chills, cough, or new shortness of breath).    Past Medical History:  Diagnosis Date  . Cardiomyopathy  ([place])   . Hypertension   . Low back pain   . Pure hypercholesterolemia   . Sleep apnea    Past Surgical History:  Procedure Laterality Date  . BACK SURGERY    . LEFT HEART CATH AND CORONARY ANGIOGRAPHY N/A 12/13/2017   Procedure: LEFT HEART CATH AND CORONARY ANGIOGRAPHY;  Surgeon: Sherren Mocha, MD;  Location: [facility];  Service: Cardiovascular;  Laterality: N/A;     Current Meds  Medication Sig  . amLODipine (NORVASC) 5 MG tablet Take 5 mg by mouth daily.  . cyclobenzaprine (FLEXERIL) 10 MG tablet Take 10 mg by mouth at bedtime as needed for muscle pain.  Marland Kitchen glucose blood test strip 1 each by Other route daily as needed for other (blood sugar). Use as instructed   . hydrocortisone (ANUSOL-HC) 2.5 % rectal cream Apply rectally 2 times daily  . losartan-hydrochlorothiazide (HYZAAR) 100-12.5 MG tablet Take 1 tablet by mouth daily.  . metoprolol succinate (TOPROL-XL) 50 MG 24 hr tablet Take 1 tablet (50 mg total) by mouth daily. Take with or immediately following a meal.  . Olopatadine HCl (PAZEO) 0.7 % SOLN Place 1 drop into both eyes 2 (two) times daily.  Glory Rosebush DELICA LANCETS 31D MISC 1 each by Does not apply route daily as needed (blood sugar).   Marland Kitchen oxyCODONE-acetaminophen (PERCOCET) 10-325 MG tablet Take 1 tablet by mouth every 8 (eight) hours as needed for pain.   . pravastatin (PRAVACHOL) 40 MG tablet Take 1 tablet (40  mg total) by mouth daily.  . Semaglutide,0.25 or 0.5MG /DOS, (OZEMPIC, 0.25 OR 0.5 MG/DOSE,) 2 MG/1.5ML SOPN Inject into the skin.   . tadalafil (CIALIS) 20 MG tablet Take 20 mg by mouth daily as needed for erectile dysfunction.     Allergies:   Patient has no known allergies.   Social History   Tobacco Use  . Smoking status: Former Smoker    Packs/day: 0.25    Years: 40.00    Pack years: 10.00    Types: Cigarettes  . Smokeless tobacco: Never Used  . Tobacco comment: Quit 8 years ago.   Substance Use Topics  . Alcohol use: No  . Drug use: Yes     Types: Oxycodone     Family Hx: The patient's family history includes Aneurysm (age of onset: 73) in his father; Diabetes in his mother; Heart attack (age of onset: 24) in his mother; Lung cancer in his brother.  ROS:   Please see the history of present illness.    As stated in the HPI and negative for all other systems.   Prior CV studies:   The following studies were reviewed today:  Echo  Labs/Other Tests and Data Reviewed:    EKG:  No ECG reviewed.  Recent Labs: 10/26/2018: TSH 1.090 02/12/2019: ALT 16; BUN 13; Creatinine, Ser 1.31; Potassium 4.0; Sodium 142   Recent Lipid Panel Lab Results  Component Value Date/Time   CHOL 160 02/12/2019 11:18 AM   TRIG 177 (H) 02/12/2019 11:18 AM   HDL 41 02/12/2019 11:18 AM   CHOLHDL 3.9 02/12/2019 11:18 AM   LDLCALC 84 02/12/2019 11:18 AM    Wt Readings from Last 3 Encounters:  05/10/19 (!) 304 lb (137.9 kg)  05/02/19 (!) 301 lb 9.6 oz (136.8 kg)  01/30/19 297 lb (134.7 kg)     Objective:    Vital Signs:  Ht 6' 1.8" (1.875 m)   Wt (!) 304 lb (137.9 kg)   BMI 39.24 kg/m    NA  ASSESSMENT & PLAN:    CHEST PAIN:   He has had mild plaque.  He is no longer bothered by the discomfort that he had previously.  No change in therapy.  No further work-up.  n.    DM:   His A1c was most recently 6.2.  This was up slightly from previous.  I will defer to Dorothyann Peng, MD  DYSLIPIDEMIA:  His LDL was 84 in July.  No change in therapy.    MORBID OBESITY:   He had previously been referred to our [facility].  His weight is gone up however.  CARDIOMYOPATHY: He does not have a blood pressure today and I asked him to get his blood pressure cuff so I could know whether I can uptitrate his beta-blocker in the future.  For now he by history seems to be euvolemic.   COVID-19 Education: The signs and symptoms of COVID-19 were discussed with the patient and how to seek care for testing (follow up with PCP or arrange E-visit).   The importance of social distancing was discussed today.  Of note he did this by telephone but I sent him the link for a video visit and he was easily able to pull it up.  He could be a video visit easily in the future.  Time:   Today, I have spent 16 minutes with the patient with telehealth technology discussing the above problems.     Medication Adjustments/Labs and Tests Ordered: Current medicines are  reviewed at length with the patient today.  Concerns regarding medicines are outlined above.   Tests Ordered: No orders of the defined types were placed in this encounter.   Medication Changes: No orders of the defined types were placed in this encounter.   Follow Up:  In Person in 1 year(s)  Signed, Rollene RotundaJames Pang Robers, MD  05/10/2019 10:35 AM    [facility]

## 2019-05-10 ENCOUNTER — Encounter: Payer: Self-pay | Admitting: Cardiology

## 2019-05-10 ENCOUNTER — Telehealth (INDEPENDENT_AMBULATORY_CARE_PROVIDER_SITE_OTHER): Payer: Medicare Other | Admitting: Cardiology

## 2019-05-10 VITALS — Ht 73.8 in | Wt 304.0 lb

## 2019-05-10 DIAGNOSIS — I420 Dilated cardiomyopathy: Secondary | ICD-10-CM

## 2019-05-10 DIAGNOSIS — G4733 Obstructive sleep apnea (adult) (pediatric): Secondary | ICD-10-CM

## 2019-05-10 NOTE — Patient Instructions (Signed)

## 2019-05-14 ENCOUNTER — Telehealth: Payer: Self-pay | Admitting: Cardiology

## 2019-05-14 NOTE — Telephone Encounter (Signed)
° °  Pt c/o BP issue: STAT if pt c/o blurred vision, one-sided weakness or slurred speech  1. What are your last 5 BP readings?  133/79  2. Are you having any other symptoms (ex. Dizziness, headache, blurred vision, passed out)?  no  3. What is your BP issue? Pt was advised by Dr. Percival Spanish to take his BP and report it back to the office.  This reading is from 05/14/19

## 2019-05-14 NOTE — Telephone Encounter (Signed)
BP OK.  No change in therapy.

## 2019-05-15 NOTE — Telephone Encounter (Signed)
Spoke to pt. Verbalized understanding.  

## 2019-05-21 ENCOUNTER — Other Ambulatory Visit: Payer: Self-pay | Admitting: Internal Medicine

## 2019-07-17 ENCOUNTER — Other Ambulatory Visit: Payer: Self-pay

## 2019-07-17 ENCOUNTER — Ambulatory Visit (INDEPENDENT_AMBULATORY_CARE_PROVIDER_SITE_OTHER): Payer: Medicare Other | Admitting: Internal Medicine

## 2019-07-17 ENCOUNTER — Encounter: Payer: Self-pay | Admitting: Internal Medicine

## 2019-07-17 VITALS — BP 110/86 | HR 85 | Temp 98.0°F | Ht 73.0 in | Wt 302.0 lb

## 2019-07-17 DIAGNOSIS — N182 Chronic kidney disease, stage 2 (mild): Secondary | ICD-10-CM | POA: Diagnosis not present

## 2019-07-17 DIAGNOSIS — Z0000 Encounter for general adult medical examination without abnormal findings: Secondary | ICD-10-CM | POA: Diagnosis not present

## 2019-07-17 DIAGNOSIS — R351 Nocturia: Secondary | ICD-10-CM

## 2019-07-17 DIAGNOSIS — R7303 Prediabetes: Secondary | ICD-10-CM

## 2019-07-17 DIAGNOSIS — I129 Hypertensive chronic kidney disease with stage 1 through stage 4 chronic kidney disease, or unspecified chronic kidney disease: Secondary | ICD-10-CM | POA: Diagnosis not present

## 2019-07-17 DIAGNOSIS — Z6839 Body mass index (BMI) 39.0-39.9, adult: Secondary | ICD-10-CM

## 2019-07-17 MED ORDER — TADALAFIL 20 MG PO TABS: 20.0000 mg | ORAL_TABLET | Freq: Every day | ORAL | 1 refills | Status: DC | PRN

## 2019-07-17 NOTE — Patient Instructions (Signed)

## 2019-07-18 LAB — CMP14+EGFR
ALT: 20 IU/L (ref 0–44)
AST: 32 IU/L (ref 0–40)
Albumin/Globulin Ratio: 1.4 (ref 1.2–2.2)
Albumin: 4.3 g/dL (ref 3.8–4.8)
Alkaline Phosphatase: 63 IU/L (ref 39–117)
BUN/Creatinine Ratio: 10 (ref 10–24)
BUN: 13 mg/dL (ref 8–27)
Bilirubin Total: 0.5 mg/dL (ref 0.0–1.2)
CO2: 26 mmol/L (ref 20–29)
Calcium: 9.9 mg/dL (ref 8.6–10.2)
Chloride: 101 mmol/L (ref 96–106)
Creatinine, Ser: 1.24 mg/dL (ref 0.76–1.27)
GFR calc Af Amer: 68 mL/min/{1.73_m2} (ref >59)
GFR calc non Af Amer: 59 mL/min/{1.73_m2} — ABNORMAL LOW (ref >59)
Globulin, Total: 3 g/dL (ref 1.5–4.5)
Glucose: 97 mg/dL (ref 65–99)
Potassium: 4.1 mmol/L (ref 3.5–5.2)
Sodium: 139 mmol/L (ref 134–144)
Total Protein: 7.3 g/dL (ref 6.0–8.5)

## 2019-07-18 LAB — CBC
Hematocrit: 40.4 % (ref 37.5–51.0)
Hemoglobin: 13.7 g/dL (ref 13.0–17.7)
MCH: 30.2 pg (ref 26.6–33.0)
MCHC: 33.9 g/dL (ref 31.5–35.7)
MCV: 89 fL (ref 79–97)
Platelets: 189 10*3/uL (ref 150–450)
RBC: 4.54 x10E6/uL (ref 4.14–5.80)
RDW: 12.2 % (ref 11.6–15.4)
WBC: 4.4 10*3/uL (ref 3.4–10.8)

## 2019-07-18 LAB — HEMOGLOBIN A1C
Est. average glucose Bld gHb Est-mCnc: 123 mg/dL
Hgb A1c MFr Bld: 5.9 % — ABNORMAL HIGH (ref 4.8–5.6)

## 2019-07-18 LAB — PSA, TOTAL AND FREE
PSA, Free Pct: 35.7 %
PSA, Free: 0.25 ng/mL
Prostate Specific Ag, Serum: 0.7 ng/mL (ref 0.0–4.0)

## 2019-07-18 LAB — HEPATITIS C ANTIBODY: Hep C Virus Ab: <0.1 s/co ratio (ref 0.0–0.9)

## 2019-07-25 ENCOUNTER — Ambulatory Visit: Payer: Medicare Other | Admitting: Internal Medicine

## 2019-07-25 ENCOUNTER — Ambulatory Visit: Payer: Medicare Other

## 2019-07-31 NOTE — Progress Notes (Signed)
This visit occurred during the SARS-CoV-2 public health emergency.  Safety protocols were in place, including screening questions prior to the visit, additional usage of staff PPE, and extensive cleaning of exam room while observing appropriate contact time as indicated for disinfecting solutions.  Subjective:     Patient ID: Raymond Welch , male    DOB: [birth year] , 68 y.o.   MRN: [id number]   Chief Complaint  Patient presents with  . Annual Exam  . Hypertension    HPI  He is here today for a full physical exam. He has no specific concerns or complaints at this time.  Hypertension This is a chronic problem. The current episode started more than 1 year ago. The problem has been gradually improving since onset. The problem is controlled. Pertinent negatives include no blurred vision, chest pain, palpitations or shortness of breath. Risk factors for coronary artery disease include dyslipidemia, obesity, sedentary lifestyle and male gender.     Past Medical History:  Diagnosis Date  . Cardiomyopathy ([place])   . Hypertension   . Low back pain   . Pure hypercholesterolemia   . Sleep apnea      Family History  Problem Relation Age of Onset  . Diabetes Mother   . Heart attack Mother 54       Died age 48  . Aneurysm Father 16  . Lung cancer Brother      Current Outpatient Medications:  .  amLODipine (NORVASC) 5 MG tablet, Take 5 mg by mouth daily., Disp: , Rfl: 1 .  cyclobenzaprine (FLEXERIL) 10 MG tablet, Take 10 mg by mouth at bedtime as needed for muscle pain., Disp: , Rfl: 2 .  glucose blood test strip, 1 each by Other route daily as needed for other (blood sugar). Use as instructed , Disp: , Rfl:  .  hydrocortisone (ANUSOL-HC) 2.5 % rectal cream, Apply rectally 2 times daily, Disp: 30 g, Rfl: 1 .  losartan-hydrochlorothiazide (HYZAAR) 100-12.5 MG tablet, TAKE 1 TABLET BY MOUTH EVERY DAY, Disp: 90 tablet, Rfl: 0 .  metoprolol succinate (TOPROL-XL) 50 MG 24 hr tablet, Take 1  tablet (50 mg total) by mouth daily. Take with or immediately following a meal., Disp: 90 tablet, Rfl: 2 .  Olopatadine HCl (PAZEO) 0.7 % SOLN, Place 1 drop into both eyes 2 (two) times daily., Disp: , Rfl:  .  ONETOUCH DELICA LANCETS 09N MISC, 1 each by Does not apply route daily as needed (blood sugar). , Disp: , Rfl:  .  oxyCODONE-acetaminophen (PERCOCET) 10-325 MG tablet, Take 1 tablet by mouth every 8 (eight) hours as needed for pain. , Disp: , Rfl:  .  pravastatin (PRAVACHOL) 40 MG tablet, Take 1 tablet (40 mg total) by mouth daily., Disp: 90 tablet, Rfl: 2 .  tadalafil (CIALIS) 20 MG tablet, Take 1 tablet (20 mg total) by mouth daily as needed for erectile dysfunction., Disp: 10 tablet, Rfl: 1   No Known Allergies   Men's preventive visit. Patient Health Questionnaire (PHQ-2) is    Clinical Support from 05/02/2019 in [facility]  PHQ-2 Total Score  0    . Patient is on a healthy diet. Marital status: Married. Relevant history for alcohol use is:  Social History   Substance and Sexual Activity  Alcohol Use No  . Relevant history for tobacco use is:  Social History   Tobacco Use  Smoking Status Former Smoker  . Packs/day: 0.25  . Years: 40.00  . Pack years: 10.00  .  Types: Cigarettes  Smokeless Tobacco Never Used  Tobacco Comment   Quit 8 years ago.   .  Review of Systems  Constitutional: Negative.   HENT: Negative.   Eyes: Negative.  Negative for blurred vision.  Respiratory: Negative.  Negative for shortness of breath.   Cardiovascular: Negative.  Negative for chest pain and palpitations.  Endocrine: Negative.   Genitourinary: Negative.   Musculoskeletal: Negative.   Skin: Negative.   Allergic/Immunologic: Negative.   Neurological: Negative.   Hematological: Negative.   Psychiatric/Behavioral: Negative.      Today's Vitals   07/17/19 1445  BP: 110/86  Pulse: 85  Temp: 98 F (36.7 C)  TempSrc: Oral  Weight: (!) 302 lb (137 kg)   Height: '6\' 1"'  (1.854 m)  PainSc: 0-No pain   Body mass index is 39.84 kg/m.   Objective:  Physical Exam Vitals and nursing note reviewed.  Constitutional:      Appearance: Normal appearance. He is obese.  HENT:     Head: Normocephalic and atraumatic.     Right Ear: Tympanic membrane, ear canal and external ear normal.     Left Ear: Tympanic membrane, ear canal and external ear normal.     Nose:     Comments: Deferred, masked    Mouth/Throat:     Comments: Deferred, masked Eyes:     Extraocular Movements: Extraocular movements intact.     Conjunctiva/sclera: Conjunctivae normal.     Pupils: Pupils are equal, round, and reactive to light.  Cardiovascular:     Rate and Rhythm: Normal rate and regular rhythm.     Pulses: Normal pulses.          Dorsalis pedis pulses are 2+ on the right side and 2+ on the left side.     Heart sounds: Normal heart sounds.  Pulmonary:     Effort: Pulmonary effort is normal.     Breath sounds: Normal breath sounds.  Chest:     Breasts:        Right: Normal. No swelling, bleeding, inverted nipple, mass or nipple discharge.        Left: Normal. No swelling, bleeding, inverted nipple, mass or nipple discharge.  Abdominal:     General: Bowel sounds are normal.     Palpations: Abdomen is soft.     Comments: Rounded, firm. Difficult to assess organomegaly  Genitourinary:    Comments: Deferred, as per patient.  Musculoskeletal:        General: Signs of injury present. Normal range of motion.     Cervical back: Normal range of motion and neck supple.  Feet:     Right foot:     Protective Sensation: 5 sites tested. 5 sites sensed.     Skin integrity: Callus and dry skin present.     Toenail Condition: Right toenails are abnormally thick.     Left foot:     Protective Sensation: 5 sites tested. 5 sites sensed.     Skin integrity: Callus and dry skin present.     Toenail Condition: Left toenails are abnormally thick.  Skin:    General: Skin is  warm.  Neurological:     General: No focal deficit present.     Mental Status: He is alert.  Psychiatric:        Mood and Affect: Mood normal.        Behavior: Behavior normal.         Assessment And Plan:     1. Routine general medical  examination at health care facility  A full exam was performed. DRE deferred, per his request. PATIENT HAS BEEN ADVISED TO GET 30-45 MINUTES REGULAR EXERCISE NO LESS THAN FOUR TO FIVE DAYS PER WEEK - BOTH WEIGHTBEARING EXERCISES AND AEROBIC ARE RECOMMENDED.  HE IS ADVISED TO FOLLOW A HEALTHY DIET WITH AT LEAST SIX FRUITS/VEGGIES PER DAY, DECREASE INTAKE OF RED MEAT, AND TO INCREASE FISH INTAKE TO TWO DAYS PER WEEK.  MEATS/FISH SHOULD NOT BE FRIED, BAKED OR BROILED IS PREFERABLE.  I SUGGEST WEARING SPF 50 SUNSCREEN ON EXPOSED PARTS AND ESPECIALLY WHEN IN THE DIRECT SUNLIGHT FOR AN EXTENDED PERIOD OF TIME.  PLEASE AVOID FAST FOOD RESTAURANTS AND INCREASE YOUR WATER INTAKE.  2. Parenchymal renal hypertension, stage 1 through stage 4 or unspecified chronic kidney disease  Chronic, well controlled. He will continue with current meds. He is encouraged to avoid adding salt to his foods. EKG performed, no new changes noted. He is encouraged to avoid adding salt to his foods. He will rto in six months for re-evaluation.   - EKG 12-Lead - Hepatitis C antibody - CMP14+EGFR - CBC  3. Chronic renal disease, stage II  Chronic, yet stable. He is encouraged to stay well hydrated. I will check a GFR, Cr today.   4. Pre-diabetes  HIS A1C HAS BEEN ELEVATED IN THE PAST. I WILL CHECK AN A1C, BMET TODAY. HE WAS ENCOURAGED TO AVOID SUGARY BEVERAGES AND PROCESSED FOODS INCLUDNG BREADS, RICE AND PASTA.  - Hemoglobin A1c  5. Nocturia  DRE deferred, I will check PSA today.   - PSA, total and free  6. Class 2 severe obesity due to excess calories with serious comorbidity and body mass index (BMI) of 39.0 to 39.9 in adult [facility])  He is encouraged to strive to lose 30  pounds (ten percent of his body weight) to decrease cardiac risk. He is encouraged to increase activity and to avoid sugary beverages and processed foods.   [name], MD    THE PATIENT IS ENCOURAGED TO PRACTICE SOCIAL DISTANCING DUE TO THE COVID-19 PANDEMIC.

## 2019-08-09 DIAGNOSIS — M542 Cervicalgia: Secondary | ICD-10-CM | POA: Diagnosis not present

## 2019-08-09 DIAGNOSIS — I10 Essential (primary) hypertension: Secondary | ICD-10-CM | POA: Diagnosis not present

## 2019-08-09 DIAGNOSIS — M961 Postlaminectomy syndrome, not elsewhere classified: Secondary | ICD-10-CM | POA: Diagnosis not present

## 2019-09-03 ENCOUNTER — Other Ambulatory Visit: Payer: Self-pay | Admitting: Internal Medicine

## 2019-09-25 ENCOUNTER — Other Ambulatory Visit: Payer: Self-pay | Admitting: Internal Medicine

## 2019-10-02 ENCOUNTER — Other Ambulatory Visit: Payer: Self-pay | Admitting: Internal Medicine

## 2019-10-02 NOTE — Telephone Encounter (Signed)
Patient is requesting a refill of Tadalafil 20 MG Oral Tablet  Last OV-07/17/2019  Next OV- 11/15/2019

## 2019-10-25 DIAGNOSIS — G4733 Obstructive sleep apnea (adult) (pediatric): Secondary | ICD-10-CM | POA: Diagnosis not present

## 2019-11-05 DIAGNOSIS — M542 Cervicalgia: Secondary | ICD-10-CM | POA: Diagnosis not present

## 2019-11-05 DIAGNOSIS — M961 Postlaminectomy syndrome, not elsewhere classified: Secondary | ICD-10-CM | POA: Diagnosis not present

## 2019-11-08 ENCOUNTER — Other Ambulatory Visit: Payer: Self-pay | Admitting: Internal Medicine

## 2019-11-15 ENCOUNTER — Encounter: Payer: Self-pay | Admitting: Internal Medicine

## 2019-11-15 ENCOUNTER — Ambulatory Visit (INDEPENDENT_AMBULATORY_CARE_PROVIDER_SITE_OTHER): Payer: Medicare Other | Admitting: Internal Medicine

## 2019-11-15 ENCOUNTER — Other Ambulatory Visit: Payer: Self-pay

## 2019-11-15 ENCOUNTER — Ambulatory Visit (INDEPENDENT_AMBULATORY_CARE_PROVIDER_SITE_OTHER): Payer: Medicare Other

## 2019-11-15 VITALS — BP 132/80 | HR 74 | Temp 98.0°F | Ht 73.8 in | Wt 297.0 lb

## 2019-11-15 VITALS — BP 132/80 | HR 74 | Temp 98.0°F | Ht 73.8 in | Wt 297.4 lb

## 2019-11-15 DIAGNOSIS — Z6838 Body mass index (BMI) 38.0-38.9, adult: Secondary | ICD-10-CM

## 2019-11-15 DIAGNOSIS — I420 Dilated cardiomyopathy: Secondary | ICD-10-CM | POA: Diagnosis not present

## 2019-11-15 DIAGNOSIS — R7303 Prediabetes: Secondary | ICD-10-CM

## 2019-11-15 DIAGNOSIS — Z0000 Encounter for general adult medical examination without abnormal findings: Secondary | ICD-10-CM

## 2019-11-15 DIAGNOSIS — Z23 Encounter for immunization: Secondary | ICD-10-CM

## 2019-11-15 DIAGNOSIS — Z2821 Immunization not carried out because of patient refusal: Secondary | ICD-10-CM

## 2019-11-15 DIAGNOSIS — N182 Chronic kidney disease, stage 2 (mild): Secondary | ICD-10-CM | POA: Diagnosis not present

## 2019-11-15 DIAGNOSIS — G4733 Obstructive sleep apnea (adult) (pediatric): Secondary | ICD-10-CM | POA: Diagnosis not present

## 2019-11-15 DIAGNOSIS — I129 Hypertensive chronic kidney disease with stage 1 through stage 4 chronic kidney disease, or unspecified chronic kidney disease: Secondary | ICD-10-CM

## 2019-11-15 DIAGNOSIS — Z9989 Dependence on other enabling machines and devices: Secondary | ICD-10-CM

## 2019-11-15 MED ORDER — LOSARTAN POTASSIUM-HCTZ 100-12.5 MG PO TABS: 1.0000 | ORAL_TABLET | Freq: Every day | ORAL | 2 refills | Status: DC

## 2019-11-15 MED ORDER — TADALAFIL 20 MG PO TABS: ORAL_TABLET | ORAL | 2 refills | Status: DC

## 2019-11-15 MED ORDER — PRAVASTATIN SODIUM 40 MG PO TABS: 40.0000 mg | ORAL_TABLET | Freq: Every day | ORAL | 2 refills | Status: DC

## 2019-11-15 NOTE — Patient Instructions (Signed)
Cardiomyopathy, Adult  Cardiomyopathy is a long-term (chronic) disease of the heart muscle. The disease makes the heart muscle thick, weak, or stiff. As a result, the heart works harder to pump blood. Over time, cardiomyopathy can lead to an irregular heartbeat (arrhythmia) and heart failure. There are several types of cardiomyopathy. The kind of cardiomyopathy that you have depends on what part of the heart is affected and how it is affected. What are the causes? This condition may be caused by:  A medical condition that damages the heart, such as diabetes, high blood pressure, or heart attack.  Diseases of the immune system, connective tissues, or endocrine system.  Alcohol abuse.  Using illegal drugs.  Taking certain medicines.  Pregnancy.  Too much iron in the body (hemochromatosis).  Cancer treatments.  Protein buildup in the organs (amyloidosis), or inflammation in the organs (sarcoidosis). In some cases, the cause is not known. What increases the risk? You are more likely to develop this condition if:  You have a gene that is passed down (inherited) from a family member.  You are overweight or obese. What are the signs or symptoms? Often, people with this condition have no symptoms. If you do have symptoms, this may include:  Shortness of breath, especially during activity.  Fatigue.  An irregular heartbeat.  Feeling dizzy or light-headed.  Fainting.  Chest pain.  Coughing.  Swelling of the lower legs, feet, abdomen, or neck veins. How is this diagnosed? This condition is diagnosed based on:  Your symptoms and medical history.  A physical exam.  Blood tests and imaging studies, such as X-ray, echocardiogram, or MRI.  Other tests, such as: ? An electrocardiogram (ECG). ? A portable heart monitor that records your heart's electrical activity (event monitor). ? A stress test. ? Cardiac catheterization and coronary angiogram. ? Heart tissue  biopsy. How is this treated? Your treatment depends on the type and severity of your symptoms. If you do not have symptoms, you may not need treatment. Treatment may include:  General healthy lifestyle changes.  Medicines to: ? Treat high blood pressure, abnormal heart rate, or inflammation. ? Clear excess fluids from your body. ? Prevent blood clots. ? Balance minerals (electrolytes) in your body and get rid of extra sodium in your body. ? Strengthen your heartbeat.  Surgery to: ? Repair a heart defect, remove heart tissue, or destroy tissues in the area of abnormal electrical activity (ablation). ? Implant a device to treat serious heart rhythm problems or to restore the heart's ability to pump blood. ? Replace your heart with a healthy heart. This is done if all other treatments have failed. Other treatments may include:  Cardiac resynchronization therapy (CRT). This restores the heart's normal beating. Follow these instructions at home: Lifestyle   Eat a heart-healthy diet that includes plenty of fruits, vegetables, whole grains, and foods that are low in salt (sodium).  Maintain a healthy weight.  Stay physically active. Ask your health care provider what activities are safe for you.  Do not use any products that contain nicotine or tobacco, such as cigarettes, e-cigarettes, and chewing tobacco. If you need help quitting, ask your health care provider.  Try to get at least 7 hours of sleep each night.  Find healthy ways to manage stress. Alcohol use  Do not drink alcohol if: ? Your health care provider tells you not to drink. ? You are pregnant, may be pregnant, or are planning to become pregnant. If you drink alcohol:  Limit how   much you use to: ? 0-1 drink a day for women. ? 0-2 drinks a day for men.  Be aware of how much alcohol is in your drink. In the [place] one drink equals one 12 oz bottle of beer (355 mL), one 5 oz glass of wine (148 mL), or one 1 oz glass  of hard liquor (44 mL). General instructions  Take over-the-counter and prescription medicines only as told by your health care provider. Some medicines can be dangerous for your heart.  If you are prescribed a blood thinner, make sure you understand what to do in a bleeding situation.  Tell all health care providers, including your dentist, that you have cardiomyopathy. Ask your health care provider if you need antibiotics before having dental care or before surgery.  Ask your health care provider if you should wear a medical identification bracelet. This may be important if you have a pacemaker or a defibrillator.  Get all needed vaccines. Get a flu shot every year.  Work closely with your health care provider to manage any chronic conditions.  If you plan to start a family, talk to a genetic counselor to discuss the risk of having a child with cardiomyopathy.  Keep all follow-up visits as told by your health care provider. This is important, even if you do not have any symptoms. Your health care provider may need to make sure your condition is not getting worse. Contact a health care provider if:  Your symptoms get worse.  You have new symptoms. Get help right away if you:  Have severe chest pain.  Have shortness of breath.  Cough up a pink, bubbly substance.  Feel nauseous and you vomit.  Suddenly become light-headed or dizzy.  Feel your heart beating very quickly.  Feel like your heart is skipping beats. These symptoms may represent a serious problem that is an emergency. Do not wait to see if the symptoms will go away. Get medical help right away. Call your local emergency services (911 in the [place] Do not drive yourself to the hospital. Summary  Cardiomyopathy is a long-term (chronic) disease of the heart muscle.  Over time, cardiomyopathy can lead to an irregular heartbeat (arrhythmia) and heart failure.  A number of treatments are available for this condition.  Your treatment will depend on the type and severity of your symptoms.  Follow your health care provider's instructions on diet, medicines, physical activity, and when to seek help. This information is not intended to replace advice given to you by your health care provider. Make sure you discuss any questions you have with your health care provider. Document Revised: 09/21/2018 Document Reviewed: 09/21/2018 Elsevier Patient Education  2020 [employer]  

## 2019-11-15 NOTE — Patient Instructions (Signed)
Raymond Welch , Thank you for taking time to come for your Medicare Wellness Visit. I appreciate your ongoing commitment to your health goals. Please review the following plan we discussed and let me know if I can assist you in the future.   Screening recommendations/referrals: Colonoscopy: 09/2013 Recommended yearly ophthalmology/optometry visit for glaucoma screening and checkup Recommended yearly dental visit for hygiene and checkup  Vaccinations: Influenza vaccine: declined Pneumococcal vaccine: declined Tdap vaccine: 01/2015 Shingles vaccine: discussed    Advanced directives: Advance directive discussed with you today. Even though you declined this today please call our office should you change your mind and we can give you the proper paperwork for you to fill out.   Conditions/risks identified: obesity  Next appointment: 02/18/2020 at 9:15  Preventive Care 65 Years and Older, Male Preventive care refers to lifestyle choices and visits with your health care provider that can promote health and wellness. What does preventive care include?  A yearly physical exam. This is also called an annual well check.  Dental exams once or twice a year.  Routine eye exams. Ask your health care provider how often you should have your eyes checked.  Personal lifestyle choices, including:  Daily care of your teeth and gums.  Regular physical activity.  Eating a healthy diet.  Avoiding tobacco and drug use.  Limiting alcohol use.  Practicing safe sex.  Taking low doses of aspirin every day.  Taking vitamin and mineral supplements as recommended by your health care provider. What happens during an annual well check? The services and screenings done by your health care provider during your annual well check will depend on your age, overall health, lifestyle risk factors, and family history of disease. Counseling  Your health care provider may ask you questions about your:  Alcohol  use.  Tobacco use.  Drug use.  Emotional well-being.  Home and relationship well-being.  Sexual activity.  Eating habits.  History of falls.  Memory and ability to understand (cognition).  Work and work Astronomer. Screening  You may have the following tests or measurements:  Height, weight, and BMI.  Blood pressure.  Lipid and cholesterol levels. These may be checked every 5 years, or more frequently if you are over 52 years old.  Skin check.  Lung cancer screening. You may have this screening every year starting at age 53 if you have a 30-pack-year history of smoking and currently smoke or have quit within the past 15 years.  Fecal occult blood test (FOBT) of the stool. You may have this test every year starting at age 84.  Flexible sigmoidoscopy or colonoscopy. You may have a sigmoidoscopy every 5 years or a colonoscopy every 10 years starting at age 61.  Prostate cancer screening. Recommendations will vary depending on your family history and other risks.  Hepatitis C blood test.  Hepatitis B blood test.  Sexually transmitted disease (STD) testing.  Diabetes screening. This is done by checking your blood sugar (glucose) after you have not eaten for a while (fasting). You may have this done every 1-3 years.  Abdominal aortic aneurysm (AAA) screening. You may need this if you are a current or former smoker.  Osteoporosis. You may be screened starting at age 73 if you are at high risk. Talk with your health care provider about your test results, treatment options, and if necessary, the need for more tests. Vaccines  Your health care provider may recommend certain vaccines, such as:  Influenza vaccine. This is recommended every year.  Tetanus, diphtheria, and acellular pertussis (Tdap, Td) vaccine. You may need a Td booster every 10 years.  Zoster vaccine. You may need this after age 31.  Pneumococcal 13-valent conjugate (PCV13) vaccine. One dose is  recommended after age 15.  Pneumococcal polysaccharide (PPSV23) vaccine. One dose is recommended after age 79. Talk to your health care provider about which screenings and vaccines you need and how often you need them. This information is not intended to replace advice given to you by your health care provider. Make sure you discuss any questions you have with your health care provider. Document Released: 08/15/2015 Document Revised: 04/07/2016 Document Reviewed: 05/20/2015 Elsevier Interactive Patient Education  2017 [place] Prevention in the Home Falls can cause injuries. They can happen to people of all ages. There are many things you can do to make your home safe and to help prevent falls. What can I do on the outside of my home?  Regularly fix the edges of walkways and driveways and fix any cracks.  Remove anything that might make you trip as you walk through a door, such as a raised step or threshold.  Trim any bushes or trees on the path to your home.  Use bright outdoor lighting.  Clear any walking paths of anything that might make someone trip, such as rocks or tools.  Regularly check to see if handrails are loose or broken. Make sure that both sides of any steps have handrails.  Any raised decks and porches should have guardrails on the edges.  Have any leaves, snow, or ice cleared regularly.  Use sand or salt on walking paths during winter.  Clean up any spills in your garage right away. This includes oil or grease spills. What can I do in the bathroom?  Use night lights.  Install grab bars by the toilet and in the tub and shower. Do not use towel bars as grab bars.  Use non-skid mats or decals in the tub or shower.  If you need to sit down in the shower, use a plastic, non-slip stool.  Keep the floor dry. Clean up any water that spills on the floor as soon as it happens.  Remove soap buildup in the tub or shower regularly.  Attach bath mats  securely with double-sided non-slip rug tape.  Do not have throw rugs and other things on the floor that can make you trip. What can I do in the bedroom?  Use night lights.  Make sure that you have a light by your bed that is easy to reach.  Do not use any sheets or blankets that are too big for your bed. They should not hang down onto the floor.  Have a firm chair that has side arms. You can use this for support while you get dressed.  Do not have throw rugs and other things on the floor that can make you trip. What can I do in the kitchen?  Clean up any spills right away.  Avoid walking on wet floors.  Keep items that you use a lot in easy-to-reach places.  If you need to reach something above you, use a strong step stool that has a grab bar.  Keep electrical cords out of the way.  Do not use floor polish or wax that makes floors slippery. If you must use wax, use non-skid floor wax.  Do not have throw rugs and other things on the floor that can make you trip. What can I do  with my stairs?  Do not leave any items on the stairs.  Make sure that there are handrails on both sides of the stairs and use them. Fix handrails that are broken or loose. Make sure that handrails are as long as the stairways.  Check any carpeting to make sure that it is firmly attached to the stairs. Fix any carpet that is loose or worn.  Avoid having throw rugs at the top or bottom of the stairs. If you do have throw rugs, attach them to the floor with carpet tape.  Make sure that you have a light switch at the top of the stairs and the bottom of the stairs. If you do not have them, ask someone to add them for you. What else can I do to help prevent falls?  Wear shoes that:  Do not have high heels.  Have rubber bottoms.  Are comfortable and fit you well.  Are closed at the toe. Do not wear sandals.  If you use a stepladder:  Make sure that it is fully opened. Do not climb a closed  stepladder.  Make sure that both sides of the stepladder are locked into place.  Ask someone to hold it for you, if possible.  Clearly mark and make sure that you can see:  Any grab bars or handrails.  First and last steps.  Where the edge of each step is.  Use tools that help you move around (mobility aids) if they are needed. These include:  Canes.  Walkers.  Scooters.  Crutches.  Turn on the lights when you go into a dark area. Replace any light bulbs as soon as they burn out.  Set up your furniture so you have a clear path. Avoid moving your furniture around.  If any of your floors are uneven, fix them.  If there are any pets around you, be aware of where they are.  Review your medicines with your doctor. Some medicines can make you feel dizzy. This can increase your chance of falling. Ask your doctor what other things that you can do to help prevent falls. This information is not intended to replace advice given to you by your health care provider. Make sure you discuss any questions you have with your health care provider. Document Released: 05/15/2009 Document Revised: 12/25/2015 Document Reviewed: 08/23/2014 Elsevier Interactive Patient Education  2017 [employer].

## 2019-11-15 NOTE — Progress Notes (Signed)
This visit occurred during the SARS-CoV-2 public health emergency.  Safety protocols were in place, including screening questions prior to the visit, additional usage of staff PPE, and extensive cleaning of exam room while observing appropriate contact time as indicated for disinfecting solutions.  Subjective:   Raymond Welch is a 69 y.o. male who presents for Medicare Annual/Subsequent preventive examination.  Review of Systems:  n/a Cardiac Risk Factors include: advanced age (>43men, >61 women);hypertension;male gender;obesity (BMI >30kg/m2)     Objective:    Vitals: BP 132/80 (Patient Position: Sitting)   Pulse 74   Temp 98 F (36.7 C) (Oral)   Ht 6' 1.8" (1.875 m)   Wt 297 lb (134.7 kg)   BMI 38.34 kg/m   Body mass index is 38.34 kg/m.  Advanced Directives 11/15/2019 05/02/2019 07/19/2018 06/08/2018 12/13/2017 07/24/2017 07/22/2017  Does Patient Have a Medical Advance Directive? No No No No No No No  Would patient like information on creating a medical advance directive? No - Patient declined No - Patient declined Yes (MAU/Ambulatory/Procedural Areas - Information given) No - Patient declined No - Patient declined - -    Tobacco Social History   Tobacco Use  Smoking Status Former Smoker  . Packs/day: 0.25  . Years: 40.00  . Pack years: 10.00  . Types: Cigarettes  Smokeless Tobacco Never Used  Tobacco Comment   Quit 8 years ago.      Counseling given: Not Answered Comment: Quit 8 years ago.    Clinical Intake:  Pre-visit preparation completed: Yes  Pain : 0-10 Pain Score: 8  Pain Type: Chronic pain Pain Location: Back Pain Orientation: Lower Pain Radiating Towards: down right leg Pain Descriptors / Indicators: Constant Pain Frequency: Constant Pain Relieving Factors: percocet eases a little  Pain Relieving Factors: percocet eases a little  Nutritional Status: BMI > 30  Obese Nutritional Risks: None Diabetes: No  How often do you need to have someone help  you when you read instructions, pamphlets, or other written materials from your doctor or pharmacy?: 1 - Never What is the last grade level you completed in school?: 7th grade  Interpreter Needed?: No  Information entered by :: NAllen LPN  Past Medical History:  Diagnosis Date  . Cardiomyopathy (HCC)   . Hypertension   . Low back pain   . Pure hypercholesterolemia   . Sleep apnea    Past Surgical History:  Procedure Laterality Date  . BACK SURGERY    . LEFT HEART CATH AND CORONARY ANGIOGRAPHY N/A 12/13/2017   Procedure: LEFT HEART CATH AND CORONARY ANGIOGRAPHY;  Surgeon: Tonny Bollman, MD;  [facility] INVASIVE CV LAB;  Service: Cardiovascular;  Laterality: N/A;   Family History  Problem Relation Age of Onset  . Diabetes Mother   . Heart attack Mother 37       Died age 22  . Aneurysm Father 68  . Lung cancer Brother    Social History   Socioeconomic History  . Marital status: Married    Spouse name: Not on file  . Number of children: 8  . Years of education: Not on file  . Highest education level: Not on file  Occupational History  . Occupation: Location manager  . Occupation: retired  Tobacco Use  . Smoking status: Former Smoker    Packs/day: 0.25    Years: 40.00    Pack years: 10.00    Types: Cigarettes  . Smokeless tobacco: Never Used  . Tobacco comment: Quit 8 years ago.   Substance  and Sexual Activity  . Alcohol use: No  . Drug use: Yes    Types: Oxycodone  . Sexual activity: Yes  Other Topics Concern  . Not on file  Social History Narrative   Lives at home with wife.     Social Determinants of Health   Financial Resource Strain: Low Risk   . Difficulty of Paying Living Expenses: Not hard at all  Food Insecurity: No Food Insecurity  . Worried About Programme researcher, broadcasting/film/video in the Last Year: Never true  . Ran Out of Food in the Last Year: Never true  Transportation Needs: No Transportation Needs  . Lack of Transportation (Medical): No  . Lack of  Transportation (Non-Medical): No  Physical Activity: Insufficiently Active  . Days of Exercise per Week: 2 days  . Minutes of Exercise per Session: 20 min  Stress: No Stress Concern Present  . Feeling of Stress : Not at all  Social Connections:   . Frequency of Communication with Friends and Family:   . Frequency of Social Gatherings with Friends and Family:   . Attends Religious Services:   . Active Member of Clubs or Organizations:   . Attends Banker Meetings:   Marland Kitchen Marital Status:     Outpatient Encounter Medications as of 11/15/2019  Medication Sig  . amLODipine (NORVASC) 5 MG tablet Take 5 mg by mouth daily.  . cyclobenzaprine (FLEXERIL) 10 MG tablet Take 10 mg by mouth at bedtime as needed for muscle pain.  Marland Kitchen glucose blood test strip 1 each by Other route daily as needed for other (blood sugar). Use as instructed   . metoprolol succinate (TOPROL-XL) 50 MG 24 hr tablet TAKE 1 TABLET BY MOUTH  DAILY WITH OR IMMEDIATLEY  FOLLOWING A MEAL  . Olopatadine HCl (PAZEO) 0.7 % SOLN Place 1 drop into both eyes 2 (two) times daily.  Letta Pate DELICA LANCETS 33G MISC 1 each by Does not apply route daily as needed (blood sugar).   Marland Kitchen oxyCODONE-acetaminophen (PERCOCET) 10-325 MG tablet Take 1 tablet by mouth every 8 (eight) hours as needed for pain.   . [DISCONTINUED] losartan-hydrochlorothiazide (HYZAAR) 100-12.5 MG tablet TAKE 1 TABLET BY MOUTH EVERY DAY  . [DISCONTINUED] pravastatin (PRAVACHOL) 40 MG tablet TAKE 1 TABLET BY MOUTH  DAILY  . [DISCONTINUED] tadalafil (CIALIS) 20 MG tablet TAKE 1 TABLET BY MOUTH ONCE DAILY AS NEEDED FOR ERECTILE DYSFUNCTION   No facility-administered encounter medications on file as of 11/15/2019.    Activities of Daily Living In your present state of health, do you have any difficulty performing the following activities: 11/15/2019 05/02/2019  Hearing? N N  Vision? N Y  Comment - sometimes gets blurry due to cataracts  Difficulty concentrating or  making decisions? N N  Walking or climbing stairs? N N  Dressing or bathing? N N  Doing errands, shopping? N N  Preparing Food and eating ? N N  Using the Toilet? N N  In the past six months, have you accidently leaked urine? N N  Do you have problems with loss of bowel control? N N  Managing your Medications? N N  Managing your Finances? N N  Housekeeping or managing your Housekeeping? N N  Some recent data might be hidden    Patient Care Team: Dorothyann Peng, MD as PCP - General (Internal Medicine) Rollene Rotunda, MD as PCP - Cardiology (Cardiology)   Assessment:   This is a routine wellness examination for Raymond Welch.  Exercise Activities and Dietary  recommendations Current Exercise Habits: Home exercise routine, Type of exercise: walking, Time (Minutes): 20, Frequency (Times/Week): 2, Weekly Exercise (Minutes/Week): 40  Goals    . Patient Stated     05/02/2019, no goals    . Weight (lb) < 200 lb (90.7 kg)     11/15/2019, wants to get to 230-250 pounds       Fall Risk Fall Risk  11/15/2019 11/15/2019 05/02/2019 01/30/2019 09/13/2018  Falls in the past year? 0 0 0 0 0  Number falls in past yr: - - 0 - -  Risk for fall due to : Medication side effect - Medication side effect - -  Follow up Falls evaluation completed;Education provided;Falls prevention discussed - Falls evaluation completed;Education provided;Falls prevention discussed - -   Is the patient's home free of loose throw rugs in walkways, pet beds, electrical cords, etc?   yes      Grab bars in the bathroom? no      Handrails on the stairs?   yes      Adequate lighting?   yes  Timed Get Up and Go Performed: n/a  Depression Screen PHQ 2/9 Scores 11/15/2019 11/15/2019 05/02/2019 01/30/2019  PHQ - 2 Score 0 0 0 0  PHQ- 9 Score 0 - 0 -    Cognitive Function     6CIT Screen 11/15/2019 05/02/2019 07/19/2018  What Year? 0 points 0 points 0 points  What month? 0 points 0 points 0 points  What time? 0 points 0 points 0  points  Count back from 20 0 points 0 points 0 points  Months in reverse 4 points 4 points 2 points  Repeat phrase 2 points 2 points 2 points  Total Score 6 6 4      There is no immunization history on file for this patient.  Qualifies for Shingles Vaccine? yes  Screening Tests Health Maintenance  Topic Date Due  . PNA vac Low Risk Adult (1 of 2 - PCV13) 11/14/2020 (Originally 02/15/2015)  . HEMOGLOBIN A1C  01/15/2020  . OPHTHALMOLOGY EXAM  02/19/2020  . INFLUENZA VACCINE  03/02/2020  . FOOT EXAM  07/16/2020  . COLONOSCOPY  10/02/2023  . TETANUS/TDAP  02/14/2025  . Hepatitis C Screening  Completed   Cancer Screenings: Lung: Low Dose CT Chest recommended if Age 28-80 years, 30 pack-year currently smoking OR have quit w/in 15years. Patient does not qualify. Colorectal: up to date  Additional Screenings:  Hepatitis C Screening:07/20/2019      Plan:    Patient wants to get to 230-250 pounds.  I have personally reviewed and noted the following in the patient's chart:   . Medical and social history . Use of alcohol, tobacco or illicit drugs  . Current medications and supplements . Functional ability and status . Nutritional status . Physical activity . Advanced directives . List of other physicians . Hospitalizations, surgeries, and ER visits in previous 12 months . Vitals . Screenings to include cognitive, depression, and falls . Referrals and appointments  In addition, I have reviewed and discussed with patient certain preventive protocols, quality metrics, and best practice recommendations. A written personalized care plan for preventive services as well as general preventive health recommendations were provided to patient.     Kellie Simmering, LPN  5/80/9983

## 2019-11-15 NOTE — Progress Notes (Signed)
This visit occurred during the SARS-CoV-2 public health emergency.  Safety protocols were in place, including screening questions prior to the visit, additional usage of staff PPE, and extensive cleaning of exam room while observing appropriate contact time as indicated for disinfecting solutions.  Subjective:     Patient ID: Raymond Welch , male    DOB: 27-Dec-1950 , 69 y.o.   MRN: [id number]   Chief Complaint  Patient presents with  . Hypertension    HPI  He presents today for BP check.  He reports compliance with meds.   Hypertension This is a chronic problem. The current episode started more than 1 year ago. The problem has been gradually improving since onset. The problem is controlled. Pertinent negatives include no blurred vision, chest pain, palpitations or shortness of breath. Past treatments include angiotensin blockers and diuretics. The current treatment provides moderate improvement. Compliance problems include exercise.  Hypertensive end-organ damage includes kidney disease.     Past Medical History:  Diagnosis Date  . Cardiomyopathy ([place])   . Hypertension   . Low back pain   . Pure hypercholesterolemia   . Sleep apnea      Family History  Problem Relation Age of Onset  . Diabetes Mother   . Heart attack Mother 4       Died age 54  . Aneurysm Father 41  . Lung cancer Brother      Current Outpatient Medications:  .  amLODipine (NORVASC) 5 MG tablet, Take 5 mg by mouth daily., Disp: , Rfl: 1 .  cyclobenzaprine (FLEXERIL) 10 MG tablet, Take 10 mg by mouth at bedtime as needed for muscle pain., Disp: , Rfl: 2 .  glucose blood test strip, 1 each by Other route daily as needed for other (blood sugar). Use as instructed , Disp: , Rfl:  .  losartan-hydrochlorothiazide (HYZAAR) 100-12.5 MG tablet, Take 1 tablet by mouth daily., Disp: 90 tablet, Rfl: 2 .  metoprolol succinate (TOPROL-XL) 50 MG 24 hr tablet, TAKE 1 TABLET BY MOUTH  DAILY WITH OR IMMEDIATLEY  FOLLOWING A  MEAL, Disp: 90 tablet, Rfl: 3 .  Olopatadine HCl (PAZEO) 0.7 % SOLN, Place 1 drop into both eyes 2 (two) times daily., Disp: , Rfl:  .  ONETOUCH DELICA LANCETS 85I MISC, 1 each by Does not apply route daily as needed (blood sugar). , Disp: , Rfl:  .  oxyCODONE-acetaminophen (PERCOCET) 10-325 MG tablet, Take 1 tablet by mouth every 8 (eight) hours as needed for pain. , Disp: , Rfl:  .  pravastatin (PRAVACHOL) 40 MG tablet, Take 1 tablet (40 mg total) by mouth daily., Disp: 90 tablet, Rfl: 2 .  tadalafil (CIALIS) 20 MG tablet, TAKE 1 TABLET BY MOUTH ONCE DAILY AS NEEDED FOR ERECTILE DYSFUNCTION, Disp: 30 tablet, Rfl: 2   No Known Allergies   Review of Systems  Constitutional: Negative.   Eyes: Negative for blurred vision.  Respiratory: Negative.  Negative for shortness of breath.   Cardiovascular: Negative.  Negative for chest pain and palpitations.  Gastrointestinal: Negative.   Neurological: Negative.   Psychiatric/Behavioral: Negative.      Today's Vitals   11/15/19 0900  BP: 132/80  Pulse: 74  Temp: 98 F (36.7 C)  TempSrc: Oral  Weight: 297 lb 6.4 oz (134.9 kg)  Height: 6' 1.8" (1.875 m)   Body mass index is 38.39 kg/m.   Objective:  Physical Exam Vitals and nursing note reviewed.  Constitutional:      Appearance: Normal appearance. He is obese.  Cardiovascular:     Rate and Rhythm: Normal rate and regular rhythm.     Heart sounds: Normal heart sounds.  Pulmonary:     Effort: Pulmonary effort is normal.     Breath sounds: Normal breath sounds.  Skin:    General: Skin is warm.  Neurological:     General: No focal deficit present.     Mental Status: He is alert.  Psychiatric:        Mood and Affect: Mood normal.         Assessment And Plan:     1. Parenchymal renal hypertension, stage 1 through stage 4 or unspecified chronic kidney disease  Chronic, fair control. He will continue with current meds. He is encouraged to avoid adding salt to his foods. I will  check labs as listed below. He is encouraged to increase his daily activity.   - CMP14+EGFR - Lipid panel  2. Chronic renal disease, stage II  Chronic, I will check renal function today. He is encouraged to stay well hydrated and keep BP controlled to prevent progression of CKD.    3. Pre-diabetes  HIS A1C HAS BEEN ELEVATED IN THE PAST. I WILL CHECK AN A1C, BMET TODAY. HE WAS ENCOURAGED TO AVOID SUGARY BEVERAGES AND PROCESSED FOODS INCLUDNG BREADS, RICE AND PASTA.  - Hemoglobin A1c  4. Dilated cardiomyopathy (HCC)  Chronic, yet stable. Previous cardiology note reviewed in full detail during his visit. Pt advised he has not been seen in over a year. He agrees to referral to Dr. Percival Spanish.   5. OSA on CPAP  Chronic, importance of CPAP compliance was discussed with the patient.   6. Class 2 severe obesity due to excess calories with serious comorbidity and body mass index (BMI) of 38.0 to 38.9 in adult [facility])  He is encouraged to strive for BMI less than 32 to decrease cardiac risk. Importance of regular exercise was discussed with the patient.   7. Immunization due  He declines all vaccines, including COVID options at this time.   [name], MD    THE PATIENT IS ENCOURAGED TO PRACTICE SOCIAL DISTANCING DUE TO THE COVID-19 PANDEMIC.

## 2019-11-16 LAB — CMP14+EGFR
ALT: 20 IU/L (ref 0–44)
AST: 25 IU/L (ref 0–40)
Albumin/Globulin Ratio: 1.4 (ref 1.2–2.2)
Albumin: 4.3 g/dL (ref 3.8–4.8)
Alkaline Phosphatase: 56 IU/L (ref 39–117)
BUN/Creatinine Ratio: 9 — ABNORMAL LOW (ref 10–24)
BUN: 12 mg/dL (ref 8–27)
Bilirubin Total: 0.3 mg/dL (ref 0.0–1.2)
CO2: 24 mmol/L (ref 20–29)
Calcium: 9.9 mg/dL (ref 8.6–10.2)
Chloride: 103 mmol/L (ref 96–106)
Creatinine, Ser: 1.28 mg/dL — ABNORMAL HIGH (ref 0.76–1.27)
GFR calc Af Amer: 66 mL/min/{1.73_m2} (ref >59)
GFR calc non Af Amer: 57 mL/min/{1.73_m2} — ABNORMAL LOW (ref >59)
Globulin, Total: 3 g/dL (ref 1.5–4.5)
Glucose: 128 mg/dL — ABNORMAL HIGH (ref 65–99)
Potassium: 4.3 mmol/L (ref 3.5–5.2)
Sodium: 140 mmol/L (ref 134–144)
Total Protein: 7.3 g/dL (ref 6.0–8.5)

## 2019-11-16 LAB — LIPID PANEL
Chol/HDL Ratio: 3.9 ratio (ref 0.0–5.0)
Cholesterol, Total: 142 mg/dL (ref 100–199)
HDL: 36 mg/dL — ABNORMAL LOW (ref >39)
LDL Chol Calc (NIH): 82 mg/dL (ref 0–99)
Triglycerides: 132 mg/dL (ref 0–149)
VLDL Cholesterol Cal: 24 mg/dL (ref 5–40)

## 2019-11-16 LAB — HEMOGLOBIN A1C
Est. average glucose Bld gHb Est-mCnc: 123 mg/dL
Hgb A1c MFr Bld: 5.9 % — ABNORMAL HIGH (ref 4.8–5.6)

## 2019-11-22 DIAGNOSIS — Z7189 Other specified counseling: Secondary | ICD-10-CM | POA: Insufficient documentation

## 2019-11-22 NOTE — Progress Notes (Signed)
Cardiology Office Note   Date:  11/23/2019   ID:  Raymond Welch, DOB 03-24-1951, MRN [id number]  PCP:  Glendale Chard, MD  Cardiologist:   Minus Breeding, MD   Chief Complaint  Patient presents with  . Cardiomyopathy      History of Present Illness: Raymond Welch is a 69 y.o. male who was referred by Glendale Chard, MD for evaluation of chest pain. We saw him in May 2018 for chest pain, he had a negative POET.   However he presented again in 2019 with continued pain and DOE.    Cath demonstrated mild diffuse CAD.  However, he does have a mildly reduced EF of 45%.    Since I last saw him he has done well.  He says he is walking 3 miles a day.  He is trying diet but has not lost any weight.  He denies any cardiovascular symptoms. The patient denies any new symptoms such as chest discomfort, neck or arm discomfort. There has been no new shortness of breath, PND or orthopnea. There have been no reported palpitations, presyncope or syncope.   Past Medical History:  Diagnosis Date  . Cardiomyopathy ([place])   . Hypertension   . Low back pain   . Pure hypercholesterolemia   . Sleep apnea     Past Surgical History:  Procedure Laterality Date  . BACK SURGERY    . LEFT HEART CATH AND CORONARY ANGIOGRAPHY N/A 12/13/2017   Procedure: LEFT HEART CATH AND CORONARY ANGIOGRAPHY;  Surgeon: Sherren Mocha, MD;  Location: [facility];  Service: Cardiovascular;  Laterality: N/A;     Current Outpatient Medications  Medication Sig Dispense Refill  . amLODipine (NORVASC) 5 MG tablet Take 5 mg by mouth daily.  1  . cyclobenzaprine (FLEXERIL) 10 MG tablet Take 10 mg by mouth at bedtime as needed for muscle pain.  2  . glucose blood test strip 1 each by Other route daily as needed for other (blood sugar). Use as instructed     . losartan-hydrochlorothiazide (HYZAAR) 100-12.5 MG tablet Take 1 tablet by mouth daily. 90 tablet 2  . metoprolol succinate (TOPROL-XL) 50 MG 24 hr tablet TAKE 1 TABLET BY  MOUTH  DAILY WITH OR IMMEDIATLEY  FOLLOWING A MEAL 90 tablet 3  . Olopatadine HCl (PAZEO) 0.7 % SOLN Place 1 drop into both eyes 2 (two) times daily.    Glory Rosebush DELICA LANCETS 81W MISC 1 each by Does not apply route daily as needed (blood sugar).     Marland Kitchen oxyCODONE-acetaminophen (PERCOCET) 10-325 MG tablet Take 1 tablet by mouth every 8 (eight) hours as needed for pain.     . pravastatin (PRAVACHOL) 40 MG tablet Take 1 tablet (40 mg total) by mouth daily. 90 tablet 2  . tadalafil (CIALIS) 20 MG tablet TAKE 1 TABLET BY MOUTH ONCE DAILY AS NEEDED FOR ERECTILE DYSFUNCTION 30 tablet 2   No current facility-administered medications for this visit.    Allergies:   Patient has no known allergies.    ROS:  Please see the history of present illness.   Otherwise, review of systems are positive for none.   All other systems are reviewed and negative.    PHYSICAL EXAM: VS:  BP 108/68 (BP Location: Left Arm, Patient Position: Sitting, Cuff Size: Large)   Pulse 68   Temp (!) 97.1 F (36.2 C)   Ht 6\' 1"  (1.854 m)   Wt (!) 302 lb (137 kg)   BMI 39.84 kg/m  ,  BMI Body mass index is 39.84 kg/m. GENERAL:  Well appearing NECK:  No jugular venous distention, waveform within normal limits, carotid upstroke brisk and symmetric, no bruits, no thyromegaly LUNGS:  Clear to auscultation bilaterally CHEST:  Unremarkable HEART:  PMI not displaced or sustained,S1 and S2 within normal limits, no S3, no S4, no clicks, no rubs, no murmurs ABD:  Flat, positive bowel sounds normal in frequency in pitch, no bruits, no rebound, no guarding, no midline pulsatile mass, no hepatomegaly, no splenomegaly EXT:  2 plus pulses throughout, no edema, no cyanosis no clubbing   EKG:  EKG is ordered today. The ekg ordered today demonstrates sinus rhythm, rate 68, axis within normal intervals within normal limits, no acute ST-T wave changes.   Recent Labs: 07/17/2019: Hemoglobin 13.7; Platelets 189 11/15/2019: ALT 20; BUN 12;  Creatinine, Ser 1.28; Potassium 4.3; Sodium 140    Lipid Panel    Component Value Date/Time   CHOL 142 11/15/2019 0943   TRIG 132 11/15/2019 0943   HDL 36 (L) 11/15/2019 0943   CHOLHDL 3.9 11/15/2019 0943   LDLCALC 82 11/15/2019 0943      Wt Readings from Last 3 Encounters:  11/23/19 (!) 302 lb (137 kg)  11/15/19 297 lb (134.7 kg)  11/15/19 297 lb 6.4 oz (134.9 kg)      Other studies Reviewed: Additional studies/ records that were reviewed today include: Labs. Review of the above records demonstrates:  Please see elsewhere in the note.     ASSESSMENT AND PLAN:  CHEST PAIN: He has had mild plaque.  No change in therapy.  He is no longer complaining of chest pain.  DM: His A1c was most recently  6.2.  No change in therapy.   DYSLIPIDEMIA:    LDL was 84, HDL 36.  In the absence of significant coronary plaque no change in therapy.  We talked about diet.  The goal would be an LDL less than 70.   MORBID OBESITY: Again we talked about   He had previously b diet with an emphasis on reduce carbohydrates.  SLEEP APNEA:  Uses CPAP.    CARDIOMYOPATHY:     He has had a mildly reduced ejection fraction.  Blood pressure is low so I will not uptitrate but I will check another echocardiogram.   COVID EDUCATION: He is reluctant to get the vaccine we talked about it for quite a while today.   Current medicines are reviewed at length with the patient today.  The patient does not have concerns regarding medicines.  The following changes have been made:  no change  Labs/ tests ordered today include:   Orders Placed This Encounter  Procedures  . EKG 12-Lead  . ECHOCARDIOGRAM COMPLETE     Disposition:   FU with me in one year.     Signed, Rollene Rotunda, MD  11/23/2019 10:02 AM    [facility]

## 2019-11-23 ENCOUNTER — Other Ambulatory Visit: Payer: Self-pay

## 2019-11-23 ENCOUNTER — Ambulatory Visit (INDEPENDENT_AMBULATORY_CARE_PROVIDER_SITE_OTHER): Payer: Medicare Other | Admitting: Cardiology

## 2019-11-23 ENCOUNTER — Encounter: Payer: Self-pay | Admitting: Cardiology

## 2019-11-23 VITALS — BP 108/68 | HR 68 | Temp 97.1°F | Ht 73.0 in | Wt 302.0 lb

## 2019-11-23 DIAGNOSIS — E785 Hyperlipidemia, unspecified: Secondary | ICD-10-CM | POA: Diagnosis not present

## 2019-11-23 DIAGNOSIS — R072 Precordial pain: Secondary | ICD-10-CM

## 2019-11-23 DIAGNOSIS — E118 Type 2 diabetes mellitus with unspecified complications: Secondary | ICD-10-CM

## 2019-11-23 DIAGNOSIS — I420 Dilated cardiomyopathy: Secondary | ICD-10-CM | POA: Diagnosis not present

## 2019-11-23 DIAGNOSIS — Z7189 Other specified counseling: Secondary | ICD-10-CM | POA: Diagnosis not present

## 2019-11-23 NOTE — Patient Instructions (Signed)
Medication Instructions:  No changes *If you need a refill on your cardiac medications before your next appointment, please call your pharmacy*  Lab Work: None ordered this visit  Testing/Procedures: Your physician has requested that you have an echocardiogram. Echocardiography is a painless test that uses sound waves to create images of your heart. It provides your doctor with information about the size and shape of your heart and how well your heart's chambers and valves are working. This procedure takes approximately one hour. There are no restrictions for this procedure. [address]  Follow-Up: At [facility], you and your health needs are our priority.  As part of our continuing mission to provide you with exceptional heart care, we have created designated Provider Care Teams.  These Care Teams include your primary Cardiologist (physician) and Advanced Practice Providers (APPs -  Physician Assistants and Nurse Practitioners) who all work together to provide you with the care you need, when you need it.  Your next appointment:   12 month(s)  You will receive a reminder letter in the mail two months in advance. If you don't receive a letter, please call our office to schedule the follow-up appointment.  The format for your next appointment:   In Person  Provider:   James Hochrein, MD  

## 2019-11-29 ENCOUNTER — Other Ambulatory Visit: Payer: Self-pay | Admitting: Internal Medicine

## 2019-12-12 ENCOUNTER — Ambulatory Visit (HOSPITAL_COMMUNITY): Payer: Medicare Other | Attending: Internal Medicine

## 2019-12-12 ENCOUNTER — Other Ambulatory Visit: Payer: Self-pay

## 2019-12-12 DIAGNOSIS — I429 Cardiomyopathy, unspecified: Secondary | ICD-10-CM

## 2019-12-12 DIAGNOSIS — I420 Dilated cardiomyopathy: Secondary | ICD-10-CM | POA: Diagnosis not present

## 2019-12-12 MED ORDER — PERFLUTREN LIPID MICROSPHERE
1.0000 mL | INTRAVENOUS | Status: AC | PRN
  Administered 2019-12-12: 2 mL via INTRAVENOUS

## 2020-01-01 ENCOUNTER — Other Ambulatory Visit: Payer: Self-pay | Admitting: Internal Medicine

## 2020-01-01 NOTE — Telephone Encounter (Signed)
LAST 11/15/19 NEXT 02/18/20

## 2020-01-02 ENCOUNTER — Other Ambulatory Visit: Payer: Self-pay | Admitting: Internal Medicine

## 2020-02-05 DIAGNOSIS — M961 Postlaminectomy syndrome, not elsewhere classified: Secondary | ICD-10-CM | POA: Diagnosis not present

## 2020-02-05 DIAGNOSIS — M542 Cervicalgia: Secondary | ICD-10-CM | POA: Diagnosis not present

## 2020-02-18 ENCOUNTER — Ambulatory Visit (INDEPENDENT_AMBULATORY_CARE_PROVIDER_SITE_OTHER): Payer: Medicare Other | Admitting: Internal Medicine

## 2020-02-18 ENCOUNTER — Encounter: Payer: Self-pay | Admitting: Internal Medicine

## 2020-02-18 ENCOUNTER — Other Ambulatory Visit: Payer: Self-pay

## 2020-02-18 VITALS — BP 126/82 | HR 68 | Temp 97.9°F | Ht 73.0 in | Wt 301.2 lb

## 2020-02-18 DIAGNOSIS — R7303 Prediabetes: Secondary | ICD-10-CM | POA: Diagnosis not present

## 2020-02-18 DIAGNOSIS — I129 Hypertensive chronic kidney disease with stage 1 through stage 4 chronic kidney disease, or unspecified chronic kidney disease: Secondary | ICD-10-CM

## 2020-02-18 DIAGNOSIS — Z6839 Body mass index (BMI) 39.0-39.9, adult: Secondary | ICD-10-CM

## 2020-02-18 DIAGNOSIS — N182 Chronic kidney disease, stage 2 (mild): Secondary | ICD-10-CM

## 2020-02-18 NOTE — Patient Instructions (Addendum)
Prediabetes Prediabetes is the condition of having a blood sugar (blood glucose) level that is higher than it should be, but not high enough for you to be diagnosed with type 2 diabetes. Having prediabetes puts you at risk for developing type 2 diabetes (type 2 diabetes mellitus). Prediabetes may be called impaired glucose tolerance or impaired fasting glucose. Prediabetes usually does not cause symptoms. Your health care provider can diagnose this condition with blood tests. You may be tested for prediabetes if you are overweight and if you have at least one other risk factor for prediabetes. What is blood glucose, and how is it measured? Blood glucose refers to the amount of glucose in your bloodstream. Glucose comes from eating foods that contain sugars and starches (carbohydrates), which the body breaks down into glucose. Your blood glucose level may be measured in mg/dL (milligrams per deciliter) or mmol/L (millimoles per liter). Your blood glucose may be checked with one or more of the following blood tests:  A fasting blood glucose (FBG) test. You will not be allowed to eat (you will fast) for 8 hours or longer before a blood sample is taken. ? A normal range for FBG is 70-100 mg/dl (3.9-5.6 mmol/L).  An A1c (hemoglobin A1c) blood test. This test provides information about blood glucose control over the previous 2?3months.  An oral glucose tolerance test (OGTT). This test measures your blood glucose at two times: ? After fasting. This is your baseline level. ? Two hours after you drink a beverage that contains glucose. You may be diagnosed with prediabetes:  If your FBG is 100?125 mg/dL (5.6-6.9 mmol/L).  If your A1c level is 5.7?6.4%.  If your OGTT result is 140?199 mg/dL (7.8-11 mmol/L). These blood tests may be repeated to confirm your diagnosis. How can this condition affect me? The pancreas produces a hormone (insulin) that helps to move glucose from the bloodstream into cells.  When cells in the body do not respond properly to insulin that the body makes (insulin resistance), excess glucose builds up in the blood instead of going into cells. As a result, high blood glucose (hyperglycemia) can develop, which can cause many complications. Hyperglycemia is a symptom of prediabetes. Having high blood glucose for a long time is dangerous. Too much glucose in your blood can damage your nerves and blood vessels. Long-term damage can lead to complications from diabetes, which may include:  Heart disease.  Stroke.  Blindness.  Kidney disease.  Depression.  Poor circulation in the feet and legs, which could lead to surgical removal (amputation) in severe cases. What can increase my risk? Risk factors for prediabetes include:  Having a family member with type 2 diabetes.  Being overweight or obese.  Being older than age 45.  Being of American Indian, African-American, Hispanic/Latino, or Asian/Pacific Islander descent.  Having an inactive (sedentary) lifestyle.  Having a history of heart disease.  History of gestational diabetes or polycystic ovary syndrome (PCOS), in women.  Having low levels of good cholesterol (HDL-C) or high levels of blood fats (triglycerides).  Having high blood pressure. What actions can I take to prevent diabetes?      Be physically active. ? Do moderate-intensity physical activity for 30 or more minutes on 5 or more days of the week, or as much as told by your health care provider. This could be brisk walking, biking, or water aerobics. ? Ask your health care provider what activities are safe for you. A mix of physical activities may be best, such as   walking, swimming, cycling, and strength training.  Lose weight as told by your health care provider. ? Losing 5-7% of your body weight can reverse insulin resistance. ? Your health care provider can determine how much weight loss is best for you and can help you lose weight  safely.  Follow a healthy meal plan. This includes eating lean proteins, complex carbohydrates, fresh fruits and vegetables, low-fat dairy products, and healthy fats. ? Follow instructions from your health care provider about eating or drinking restrictions. ? Make an appointment to see a diet and nutrition specialist (registered dietitian) to help you create a healthy eating plan that is right for you.  Do not smoke or use any tobacco products, such as cigarettes, chewing tobacco, and e-cigarettes. If you need help quitting, ask your health care provider.  Take over-the-counter and prescription medicines as told by your health care provider. You may be prescribed medicines that help lower the risk of type 2 diabetes.  Keep all follow-up visits as told by your health care provider. This is important. Summary  Prediabetes is the condition of having a blood sugar (blood glucose) level that is higher than it should be, but not high enough for you to be diagnosed with type 2 diabetes.  Having prediabetes puts you at risk for developing type 2 diabetes (type 2 diabetes mellitus).  To help prevent type 2 diabetes, make lifestyle changes such as being physically active and eating a healthy diet. Lose weight as told by your health care provider. This information is not intended to replace advice given to you by your health care provider. Make sure you discuss any questions you have with your health care provider. Document Revised: 11/10/2018 Document Reviewed: 09/09/2015 Elsevier Patient Education  2020 Elsevier Inc.  

## 2020-02-18 NOTE — Progress Notes (Signed)
I,Raymond Welch,acting as a Education administrator for Raymond Greenland, MD.,have documented all relevant documentation on the behalf of Raymond Greenland, MD,as directed by  Raymond Greenland, MD while in the presence of Raymond Greenland, MD.  This visit occurred during the SARS-CoV-2 public health emergency.  Safety protocols were in place, including screening questions prior to the visit, additional usage of staff PPE, and extensive cleaning of exam room while observing appropriate contact time as indicated for disinfecting solutions.  Subjective:     Patient ID: Raymond Welch , male    DOB: 24-Jan-1951 , 69 y.o.   MRN: [id number]   Chief Complaint  Patient presents with  . Diabetes  . Hypertension    HPI  The patient is here to day for a follow-up on pre-diabetes, and hypertension. He states he has cut back on his portion sizes. He feels he has lost weight.   Hypertension This is a chronic problem. The current episode started more than 1 year ago. The problem has been gradually improving since onset. The problem is controlled. Pertinent negatives include no blurred vision, chest pain, palpitations or shortness of breath. Past treatments include angiotensin blockers and diuretics. The current treatment provides moderate improvement. Compliance problems include exercise.  Hypertensive end-organ damage includes kidney disease.     Past Medical History:  Diagnosis Date  . Cardiomyopathy ([place])   . Hypertension   . Low back pain   . Pure hypercholesterolemia   . Sleep apnea      Family History  Problem Relation Age of Onset  . Diabetes Mother   . Heart attack Mother 38       Died age 65  . Aneurysm Father 22  . Lung cancer Brother      Current Outpatient Medications:  .  amLODipine (NORVASC) 5 MG tablet, Take 5 mg by mouth daily., Disp: , Rfl: 1 .  cyclobenzaprine (FLEXERIL) 10 MG tablet, Take 10 mg by mouth at bedtime as needed for muscle pain., Disp: , Rfl: 2 .  glucose blood test strip, 1 each  by Other route daily as needed for other (blood sugar). Use as instructed , Disp: , Rfl:  .  losartan-hydrochlorothiazide (HYZAAR) 100-12.5 MG tablet, TAKE 1 TABLET BY MOUTH EVERY DAY, Disp: 90 tablet, Rfl: 1 .  metoprolol succinate (TOPROL-XL) 50 MG 24 hr tablet, TAKE 1 TABLET BY MOUTH  DAILY WITH OR IMMEDIATLEY  FOLLOWING A MEAL, Disp: 90 tablet, Rfl: 3 .  Olopatadine HCl (PAZEO) 0.7 % SOLN, Place 1 drop into both eyes 2 (two) times daily., Disp: , Rfl:  .  ONETOUCH DELICA LANCETS 17B MISC, 1 each by Does not apply route daily as needed (blood sugar). , Disp: , Rfl:  .  oxyCODONE-acetaminophen (PERCOCET) 10-325 MG tablet, Take 1 tablet by mouth every 8 (eight) hours as needed for pain. , Disp: , Rfl:  .  pravastatin (PRAVACHOL) 40 MG tablet, Take 1 tablet (40 mg total) by mouth daily., Disp: 90 tablet, Rfl: 2 .  tadalafil (CIALIS) 20 MG tablet, TAKE 1 TABLET BY MOUTH ONCE DAILY AS NEEDED FOR ERECTILE DYSFUNCTION, Disp: 30 tablet, Rfl: 1   No Known Allergies   Review of Systems  Constitutional: Negative.   Eyes: Negative for blurred vision.  Respiratory: Negative.  Negative for shortness of breath.   Cardiovascular: Negative.  Negative for chest pain and palpitations.  Gastrointestinal: Negative.   Psychiatric/Behavioral: Negative.   All other systems reviewed and are negative.    Today's Vitals  02/18/20 0906  BP: 126/82  Pulse: 68  Temp: 97.9 F (36.6 C)  TempSrc: Oral  Weight: (!) 301 lb 3.2 oz (136.6 kg)  Height: _0  (1.854 m)  PainSc: 7   PainLoc: Back   Body mass index is 39.74 kg/m.  Wt Readings from Last 3 Encounters:  02/18/20 (!) 301 lb 3.2 oz (136.6 kg)  11/23/19 (!) 302 lb (137 kg)  11/15/19 297 lb (134.7 kg)   Objective:  Physical Exam Vitals and nursing note reviewed.  Constitutional:      Appearance: Normal appearance.  Cardiovascular:     Rate and Rhythm: Normal rate and regular rhythm.     Heart sounds: Normal heart sounds.  Pulmonary:     Effort:  Pulmonary effort is normal.     Breath sounds: Normal breath sounds.  Skin:    General: Skin is warm.  Neurological:     General: No focal deficit present.     Mental Status: He is alert.  Psychiatric:        Mood and Affect: Mood normal.         Assessment And Plan:     1. Pre-diabetes Comments: Chronic. Need for regular exercise was discussed with the patient. I will start him on Rybelsus, he denies family/personal h/o thyroid cancer.  - BMP8+EGFR - Hemoglobin A1c - Insulin, random([phone])  2. Parenchymal renal hypertension, stage 1 through stage 4 or unspecified chronic kidney disease Comments: Well controlled. He will continue with current meds. He is encouraged to avoid adding salt to his foods.   3. Chronic renal disease, stage II Comments: Chronic, encouraged to stay well hydrated.   4. Class 2 severe obesity due to excess calories with serious comorbidity and body mass index (BMI) of 39.0 to 39.9 in adult [facility])   He is encouraged to initially strive for BMI less than 30 to decrease cardiac risk. He is advised to exercise no less than 150 minutes per week.    Patient was given opportunity to ask questions. Patient verbalized understanding of the plan and was able to repeat key elements of the plan. All questions were answered to their satisfaction.  Raymond Greenland, MD   I, Raymond Greenland, MD, have reviewed all documentation for this visit. The documentation on 02/18/20 for the exam, diagnosis, procedures, and orders are all accurate and complete.  THE PATIENT IS ENCOURAGED TO PRACTICE SOCIAL

## 2020-02-19 DIAGNOSIS — H10413 Chronic giant papillary conjunctivitis, bilateral: Secondary | ICD-10-CM | POA: Diagnosis not present

## 2020-02-19 DIAGNOSIS — H16223 Keratoconjunctivitis sicca, not specified as Sjogren's, bilateral: Secondary | ICD-10-CM | POA: Diagnosis not present

## 2020-02-19 DIAGNOSIS — E119 Type 2 diabetes mellitus without complications: Secondary | ICD-10-CM | POA: Diagnosis not present

## 2020-02-19 DIAGNOSIS — Z794 Long term (current) use of insulin: Secondary | ICD-10-CM | POA: Diagnosis not present

## 2020-02-19 DIAGNOSIS — H40053 Ocular hypertension, bilateral: Secondary | ICD-10-CM | POA: Diagnosis not present

## 2020-02-19 LAB — BMP8+EGFR
BUN/Creatinine Ratio: 14 (ref 10–24)
BUN: 16 mg/dL (ref 8–27)
CO2: 24 mmol/L (ref 20–29)
Calcium: 10 mg/dL (ref 8.6–10.2)
Chloride: 104 mmol/L (ref 96–106)
Creatinine, Ser: 1.18 mg/dL (ref 0.76–1.27)
GFR calc Af Amer: 72 mL/min/{1.73_m2} (ref >59)
GFR calc non Af Amer: 62 mL/min/{1.73_m2} (ref >59)
Glucose: 125 mg/dL — ABNORMAL HIGH (ref 65–99)
Potassium: 4.2 mmol/L (ref 3.5–5.2)
Sodium: 140 mmol/L (ref 134–144)

## 2020-02-19 LAB — HEMOGLOBIN A1C
Est. average glucose Bld gHb Est-mCnc: 123 mg/dL
Hgb A1c MFr Bld: 5.9 % — ABNORMAL HIGH (ref 4.8–5.6)

## 2020-02-19 LAB — HM DIABETES EYE EXAM

## 2020-02-19 LAB — INSULIN, RANDOM: INSULIN: 72.5 u[IU]/mL — ABNORMAL HIGH (ref 2.6–24.9)

## 2020-03-03 ENCOUNTER — Other Ambulatory Visit: Payer: Self-pay | Admitting: Internal Medicine

## 2020-03-03 NOTE — Telephone Encounter (Signed)
Sialis refill

## 2020-03-13 ENCOUNTER — Encounter: Payer: Self-pay | Admitting: Internal Medicine

## 2020-04-02 ENCOUNTER — Other Ambulatory Visit: Payer: Self-pay

## 2020-04-02 ENCOUNTER — Encounter: Payer: Self-pay | Admitting: Internal Medicine

## 2020-04-02 ENCOUNTER — Ambulatory Visit (INDEPENDENT_AMBULATORY_CARE_PROVIDER_SITE_OTHER): Payer: Medicare Other | Admitting: Internal Medicine

## 2020-04-02 VITALS — BP 122/86 | HR 85 | Temp 98.0°F | Ht 73.0 in | Wt 300.2 lb

## 2020-04-02 DIAGNOSIS — Z6839 Body mass index (BMI) 39.0-39.9, adult: Secondary | ICD-10-CM

## 2020-04-02 DIAGNOSIS — I129 Hypertensive chronic kidney disease with stage 1 through stage 4 chronic kidney disease, or unspecified chronic kidney disease: Secondary | ICD-10-CM

## 2020-04-02 DIAGNOSIS — R7303 Prediabetes: Secondary | ICD-10-CM | POA: Diagnosis not present

## 2020-04-02 NOTE — Progress Notes (Signed)
I,Tianna Badgett,acting as a Neurosurgeon for [name], MD.,have documented all relevant documentation on the behalf of [name], MD,as directed by  [name], MD while in the presence of [name], MD.  This visit occurred during the SARS-CoV-2 public health emergency.  Safety protocols were in place, including screening questions prior to the visit, additional usage of staff PPE, and extensive cleaning of exam room while observing appropriate contact time as indicated for disinfecting solutions.  Subjective:     Patient ID: Raymond Welch , male    DOB: Mar 26, 1951 , 69 y.o.   MRN: [id number]   Chief Complaint  Patient presents with  . Medication Management    Rybelsus    HPI  The patient is here to day for a follow-up on pre-diabetes, and hypertension. He has been taking Rybelsus without any issues. He has no concerns at this time.  Hypertension This is a chronic problem. The current episode started more than 1 year ago. The problem has been gradually improving since onset. The problem is controlled. Pertinent negatives include no blurred vision, chest pain, palpitations or shortness of breath. Past treatments include angiotensin blockers and diuretics. The current treatment provides moderate improvement. Compliance problems include exercise.  Hypertensive end-organ damage includes kidney disease.     Past Medical History:  Diagnosis Date  . Cardiomyopathy ([facility])   . Hypertension   . Low back pain   . Pure hypercholesterolemia   . Sleep apnea      Family History  Problem Relation Age of Onset  . Diabetes Mother   . Heart attack Mother 65       Died age 11  . Aneurysm Father 45  . Lung cancer Brother      Current Outpatient Medications:  .  amLODipine (NORVASC) 5 MG tablet, Take 5 mg by mouth daily., Disp: , Rfl: 1 .  cyclobenzaprine (FLEXERIL) 10 MG tablet, Take 10 mg by mouth at bedtime as needed for muscle pain., Disp: , Rfl: 2 .  glucose blood test  strip, 1 each by Other route daily as needed for other (blood sugar). Use as instructed , Disp: , Rfl:  .  losartan-hydrochlorothiazide (HYZAAR) 100-12.5 MG tablet, TAKE 1 TABLET BY MOUTH EVERY DAY, Disp: 90 tablet, Rfl: 1 .  metoprolol succinate (TOPROL-XL) 50 MG 24 hr tablet, TAKE 1 TABLET BY MOUTH  DAILY WITH OR IMMEDIATLEY  FOLLOWING A MEAL, Disp: 90 tablet, Rfl: 3 .  Olopatadine HCl (PAZEO) 0.7 % SOLN, Place 1 drop into both eyes 2 (two) times daily., Disp: , Rfl:  .  ONETOUCH DELICA LANCETS 33G MISC, 1 each by Does not apply route daily as needed (blood sugar). , Disp: , Rfl:  .  oxyCODONE-acetaminophen (PERCOCET) 10-325 MG tablet, Take 1 tablet by mouth every 8 (eight) hours as needed for pain. , Disp: , Rfl:  .  pravastatin (PRAVACHOL) 40 MG tablet, Take 1 tablet (40 mg total) by mouth daily., Disp: 90 tablet, Rfl: 2 .  Semaglutide (RYBELSUS) 3 MG TABS, Take by mouth daily., Disp: , Rfl:  .  tadalafil (CIALIS) 20 MG tablet, TAKE 1 TABLET BY MOUTH ONCE DAILY AS NEEDED FOR ERECTILE DYSFUNCTION, Disp: 30 tablet, Rfl: 2   No Known Allergies   Review of Systems  Constitutional: Negative.   Eyes: Negative for blurred vision.  Respiratory: Negative.  Negative for shortness of breath.   Cardiovascular: Negative.  Negative for chest pain and palpitations.  Gastrointestinal: Negative.   Neurological: Negative.  Today's Vitals   04/02/20 1123  BP: 122/86  Pulse: 85  Temp: 98 F (36.7 C)  TempSrc: Oral  Weight: (!) 300 lb 3.2 oz (136.2 kg)  Height: 6\' 1"  (1.854 m)   Body mass index is 39.61 kg/m.   Objective:  Physical Exam Vitals and nursing note reviewed.  Constitutional:      Appearance: Normal appearance. He is obese.  Cardiovascular:     Rate and Rhythm: Normal rate and regular rhythm.     Heart sounds: Normal heart sounds.  Pulmonary:     Effort: Pulmonary effort is normal.     Breath sounds: Normal breath sounds.  Skin:    General: Skin is warm.  Neurological:      General: No focal deficit present.     Mental Status: He is alert.  Psychiatric:        Mood and Affect: Mood normal.         Assessment And Plan:     1. Pre-diabetes Comments: Chronic, he will continue with Rybelsus daily. Encouraged to aim for at least 150 minutes of exercise per week and advised to avoid sugary beverages. He will rto in 3 months for re-evaluation.   2. Parenchymal renal hypertension, stage 1 through stage 4 or unspecified chronic kidney disease Comments: Chronic, well controlled. He will continue with current meds. He is encouraged to avoid adding salt to his foods.   3. Class 2 severe obesity due to excess calories with serious comorbidity and body mass index (BMI) of 39.0 to 39.9 in adult [facility]) He is encouraged to initially strive for BMI less than 30 to decrease cardiac risk. He is advised to exercise no less than 150 minutes per week.  Wt Readings from Last 3 Encounters:  04/02/20 (!) 300 lb 3.2 oz (136.2 kg)  02/18/20 (!) 301 lb 3.2 oz (136.6 kg)  11/23/19 (!) 302 lb (137 kg)      Patient was given opportunity to ask questions. Patient verbalized understanding of the plan and was able to repeat key elements of the plan. All questions were answered to their satisfaction.  11/25/19, MD   I, [name], MD, have reviewed all documentation for this visit. The documentation on 04/07/20 for the exam, diagnosis, procedures, and orders are all accurate and complete.  THE PATIENT IS ENCOURAGED TO PRACTICE SOCIAL DISTANCING DUE TO THE COVID-19 PANDEMIC.

## 2020-04-02 NOTE — Patient Instructions (Addendum)
MOVE METOPROLOL TO NIGHTTIME DOSING   Hypertension, Adult Hypertension is another name for high blood pressure. High blood pressure forces your heart to work harder to pump blood. This can cause problems over time. There are two numbers in a blood pressure reading. There is a top number (systolic) over a bottom number (diastolic). It is best to have a blood pressure that is below 120/80. Healthy choices can help lower your blood pressure, or you may need medicine to help lower it. What are the causes? The cause of this condition is not known. Some conditions may be related to high blood pressure. What increases the risk?  Smoking.  Having type 2 diabetes mellitus, high cholesterol, or both.  Not getting enough exercise or physical activity.  Being overweight.  Having too much fat, sugar, calories, or salt (sodium) in your diet.  Drinking too much alcohol.  Having long-term (chronic) kidney disease.  Having a family history of high blood pressure.  Age. Risk increases with age.  Race. You may be at higher risk if you are African American.  Gender. Men are at higher risk than women before age 73. After age 85, women are at higher risk than men.  Having obstructive sleep apnea.  Stress. What are the signs or symptoms?  High blood pressure may not cause symptoms. Very high blood pressure (hypertensive crisis) may cause: ? Headache. ? Feelings of worry or nervousness (anxiety). ? Shortness of breath. ? Nosebleed. ? A feeling of being sick to your stomach (nausea). ? Throwing up (vomiting). ? Changes in how you see. ? Very bad chest pain. ? Seizures. How is this treated?  This condition is treated by making healthy lifestyle changes, such as: ? Eating healthy foods. ? Exercising more. ? Drinking less alcohol.  Your health care provider may prescribe medicine if lifestyle changes are not enough to get your blood pressure under control, and if: ? Your top number is  above 130. ? Your bottom number is above 80.  Your personal target blood pressure may vary. Follow these instructions at home: Eating and drinking   If told, follow the DASH eating plan. To follow this plan: ? Fill one half of your plate at each meal with fruits and vegetables. ? Fill one fourth of your plate at each meal with whole grains. Whole grains include whole-wheat pasta, brown rice, and whole-grain bread. ? Eat or drink low-fat dairy products, such as skim milk or low-fat yogurt. ? Fill one fourth of your plate at each meal with low-fat (lean) proteins. Low-fat proteins include fish, chicken without skin, eggs, beans, and tofu. ? Avoid fatty meat, cured and processed meat, or chicken with skin. ? Avoid pre-made or processed food.  Eat less than 1,500 mg of salt each day.  Do not drink alcohol if: ? Your doctor tells you not to drink. ? You are pregnant, may be pregnant, or are planning to become pregnant.  If you drink alcohol: ? Limit how much you use to:  0-1 drink a day for women.  0-2 drinks a day for men. ? Be aware of how much alcohol is in your drink. In the [place] one drink equals one 12 oz bottle of beer (355 mL), one 5 oz glass of wine (148 mL), or one 1 oz glass of hard liquor (44 mL). Lifestyle   Work with your doctor to stay at a healthy weight or to lose weight. Ask your doctor what the best weight is for you.  Get  at least 30 minutes of exercise most days of the week. This may include walking, swimming, or biking.  Get at least 30 minutes of exercise that strengthens your muscles (resistance exercise) at least 3 days a week. This may include lifting weights or doing Pilates.  Do not use any products that contain nicotine or tobacco, such as cigarettes, e-cigarettes, and chewing tobacco. If you need help quitting, ask your doctor.  Check your blood pressure at home as told by your doctor.  Keep all follow-up visits as told by your doctor. This is  important. Medicines  Take over-the-counter and prescription medicines only as told by your doctor. Follow directions carefully.  Do not skip doses of blood pressure medicine. The medicine does not work as well if you skip doses. Skipping doses also puts you at risk for problems.  Ask your doctor about side effects or reactions to medicines that you should watch for. Contact a doctor if you:  Think you are having a reaction to the medicine you are taking.  Have headaches that keep coming back (recurring).  Feel dizzy.  Have swelling in your ankles.  Have trouble with your vision. Get help right away if you:  Get a very bad headache.  Start to feel mixed up (confused).  Feel weak or numb.  Feel faint.  Have very bad pain in your: ? Chest. ? Belly (abdomen).  Throw up more than once.  Have trouble breathing. Summary  Hypertension is another name for high blood pressure.  High blood pressure forces your heart to work harder to pump blood.  For most people, a normal blood pressure is less than 120/80.  Making healthy choices can help lower blood pressure. If your blood pressure does not get lower with healthy choices, you may need to take medicine. This information is not intended to replace advice given to you by your health care provider. Make sure you discuss any questions you have with your health care provider. Document Revised: 03/29/2018 Document Reviewed: 03/29/2018 Elsevier Patient Education  2020 [employer].

## 2020-04-28 DIAGNOSIS — G4733 Obstructive sleep apnea (adult) (pediatric): Secondary | ICD-10-CM | POA: Diagnosis not present

## 2020-05-01 ENCOUNTER — Ambulatory Visit: Payer: Medicare Other | Admitting: Internal Medicine

## 2020-05-06 ENCOUNTER — Ambulatory Visit: Payer: Medicare Other | Admitting: Internal Medicine

## 2020-05-13 DIAGNOSIS — M961 Postlaminectomy syndrome, not elsewhere classified: Secondary | ICD-10-CM | POA: Diagnosis not present

## 2020-05-13 DIAGNOSIS — M542 Cervicalgia: Secondary | ICD-10-CM | POA: Diagnosis not present

## 2020-05-20 ENCOUNTER — Telehealth: Payer: Self-pay

## 2020-05-20 NOTE — Telephone Encounter (Signed)
The pt was notified that he needed to check with his pharmacy to see If his losartan-hctz is on the recall list.

## 2020-06-03 ENCOUNTER — Other Ambulatory Visit: Payer: Self-pay | Admitting: Internal Medicine

## 2020-06-04 ENCOUNTER — Ambulatory Visit (INDEPENDENT_AMBULATORY_CARE_PROVIDER_SITE_OTHER): Payer: Medicare Other | Admitting: Internal Medicine

## 2020-06-04 ENCOUNTER — Other Ambulatory Visit: Payer: Self-pay

## 2020-06-04 ENCOUNTER — Encounter: Payer: Self-pay | Admitting: Internal Medicine

## 2020-06-04 VITALS — BP 116/70 | HR 76 | Temp 98.1°F | Ht 73.0 in | Wt 294.8 lb

## 2020-06-04 DIAGNOSIS — R7303 Prediabetes: Secondary | ICD-10-CM | POA: Diagnosis not present

## 2020-06-04 DIAGNOSIS — Z6838 Body mass index (BMI) 38.0-38.9, adult: Secondary | ICD-10-CM

## 2020-06-04 DIAGNOSIS — I129 Hypertensive chronic kidney disease with stage 1 through stage 4 chronic kidney disease, or unspecified chronic kidney disease: Secondary | ICD-10-CM

## 2020-06-04 DIAGNOSIS — E66812 Obesity, class 2: Secondary | ICD-10-CM

## 2020-06-04 DIAGNOSIS — N182 Chronic kidney disease, stage 2 (mild): Secondary | ICD-10-CM | POA: Diagnosis not present

## 2020-06-04 LAB — BMP8+EGFR
BUN/Creatinine Ratio: 10 (ref 10–24)
BUN: 13 mg/dL (ref 8–27)
CO2: 27 mmol/L (ref 20–29)
Calcium: 10.1 mg/dL (ref 8.6–10.2)
Chloride: 101 mmol/L (ref 96–106)
Creatinine, Ser: 1.24 mg/dL (ref 0.76–1.27)
GFR calc Af Amer: 68 mL/min/{1.73_m2} (ref >59)
GFR calc non Af Amer: 59 mL/min/{1.73_m2} — ABNORMAL LOW (ref >59)
Glucose: 91 mg/dL (ref 65–99)
Potassium: 4.4 mmol/L (ref 3.5–5.2)
Sodium: 140 mmol/L (ref 134–144)

## 2020-06-04 LAB — HEMOGLOBIN A1C
Est. average glucose Bld gHb Est-mCnc: 120 mg/dL
Hgb A1c MFr Bld: 5.8 % — ABNORMAL HIGH (ref 4.8–5.6)

## 2020-06-04 MED ORDER — TADALAFIL 20 MG PO TABS
ORAL_TABLET | ORAL | 2 refills | Status: DC
Start: 2020-06-04 — End: 2020-07-22

## 2020-06-04 NOTE — Progress Notes (Signed)
I,Katawbba Wiggins,acting as a Education administrator for [name], MD.,have documented all relevant documentation on the behalf of [name], MD,as directed by  [name], MD while in the presence of [name], MD.  This visit occurred during the SARS-CoV-2 public health emergency.  Safety protocols were in place, including screening questions prior to the visit, additional usage of staff PPE, and extensive cleaning of exam room while observing appropriate contact time as indicated for disinfecting solutions.  Subjective:     Patient ID: Raymond Welch , male    DOB: August 26, 1950 , 69 y.o.   MRN: [id number]   Chief Complaint  Patient presents with  . Pre-diabetes    HPI  The patient is here to day for a follow-up on pre-diabetes, and hypertension. He has been taking Rybelsus without any issues. He has no concerns at this time.  Hypertension This is a chronic problem. The current episode started more than 1 year ago. The problem has been gradually improving since onset. The problem is controlled. Pertinent negatives include no blurred vision, chest pain, palpitations or shortness of breath. Past treatments include angiotensin blockers and diuretics. The current treatment provides moderate improvement. Compliance problems include exercise.  Hypertensive end-organ damage includes kidney disease.     Past Medical History:  Diagnosis Date  . Cardiomyopathy ([place])   . Hypertension   . Low back pain   . Pure hypercholesterolemia   . Sleep apnea      Family History  Problem Relation Age of Onset  . Diabetes Mother   . Heart attack Mother 57       Died age 31  . Aneurysm Father 12  . Lung cancer Brother      Current Outpatient Medications:  .  amLODipine (NORVASC) 5 MG tablet, Take 5 mg by mouth daily., Disp: , Rfl: 1 .  cyclobenzaprine (FLEXERIL) 10 MG tablet, Take 10 mg by mouth at bedtime as needed for muscle pain., Disp: , Rfl: 2 .  glucose blood test strip, 1 each by Other  route daily as needed for other (blood sugar). Use as instructed , Disp: , Rfl:  .  losartan-hydrochlorothiazide (HYZAAR) 100-12.5 MG tablet, TAKE 1 TABLET BY MOUTH EVERY DAY, Disp: 90 tablet, Rfl: 1 .  metoprolol succinate (TOPROL-XL) 50 MG 24 hr tablet, TAKE 1 TABLET BY MOUTH  DAILY WITH OR IMMEDIATLEY  FOLLOWING A MEAL, Disp: 90 tablet, Rfl: 3 .  Olopatadine HCl (PAZEO) 0.7 % SOLN, Place 1 drop into both eyes 2 (two) times daily., Disp: , Rfl:  .  ONETOUCH DELICA LANCETS 26J MISC, 1 each by Does not apply route daily as needed (blood sugar). , Disp: , Rfl:  .  oxyCODONE-acetaminophen (PERCOCET) 10-325 MG tablet, Take 1 tablet by mouth every 8 (eight) hours as needed for pain. , Disp: , Rfl:  .  pravastatin (PRAVACHOL) 40 MG tablet, Take 1 tablet (40 mg total) by mouth daily., Disp: 90 tablet, Rfl: 2 .  Semaglutide (RYBELSUS) 3 MG TABS, Take by mouth daily., Disp: , Rfl:  .  tadalafil (CIALIS) 20 MG tablet, One tab po qd prn, Disp: 30 tablet, Rfl: 2   No Known Allergies   Review of Systems  Constitutional: Negative.   Eyes: Negative for blurred vision.  Respiratory: Negative.  Negative for shortness of breath.   Cardiovascular: Negative.  Negative for chest pain and palpitations.  Gastrointestinal: Negative.   Psychiatric/Behavioral: Negative.   All other systems reviewed and are negative.    Today's  Vitals   06/04/20 0934  BP: 116/70  Pulse: 76  Temp: 98.1 F (36.7 C)  Weight: 294 lb 12.8 oz (133.7 kg)  Height: '6\' 1"'  (1.854 m)  PainSc: 8   PainLoc: Back   Body mass index is 38.89 kg/m.  Wt Readings from Last 3 Encounters:  06/04/20 294 lb 12.8 oz (133.7 kg)  04/02/20 (!) 300 lb 3.2 oz (136.2 kg)  02/18/20 (!) 301 lb 3.2 oz (136.6 kg)   Objective:  Physical Exam Vitals and nursing note reviewed.  Constitutional:      Appearance: Normal appearance. He is obese.  HENT:     Head: Normocephalic and atraumatic.  Cardiovascular:     Rate and Rhythm: Normal rate and regular  rhythm.     Heart sounds: Normal heart sounds.  Pulmonary:     Breath sounds: Normal breath sounds.  Skin:    General: Skin is warm.  Neurological:     General: No focal deficit present.     Mental Status: He is alert and oriented to person, place, and time.         Assessment And Plan:     1. Pre-diabetes Comments: Chronic, I will check labs as listed below. He was congratulated on weight loss and encouraged to lose another 10-15 pounds over the next several months.  - BMP8+EGFR - Hemoglobin A1c  2. Parenchymal renal hypertension, stage 1 through stage 4 or unspecified chronic kidney disease Comments: Chronic, well controlled. He will continue with current meds. Encouraged to avoid adding salt to his foods.  - BMP8+EGFR  3. Chronic renal disease, stage II Comments: Chronic, this has been stable. I will check renal function today.   4. Class 2 severe obesity due to excess calories with serious comorbidity and body mass index (BMI) of 38.0 to 38.9 in adult [facility]) Comments: Again, he was congratulated on his 6 pound weight loss.  Encouraged to aim for BMI less than 30 to decrease cardiac risk.   Patient was given opportunity to ask questions. Patient verbalized understanding of the plan and was able to repeat key elements of the plan. All questions were answered to their satisfaction.  [name], MD   I, [name], MD, have reviewed all documentation for this visit. The documentation on 06/05/20 for the exam, diagnosis, procedures, and orders are all accurate and complete.  THE PATIENT IS ENCOURAGED TO PRACTICE SOCIAL DISTANCING DUE TO THE COVID-19 PANDEMIC.

## 2020-06-04 NOTE — Patient Instructions (Signed)

## 2020-06-16 ENCOUNTER — Telehealth: Payer: Self-pay

## 2020-06-16 NOTE — Telephone Encounter (Signed)
The pt was notified that samples of Rybelsus is ready for pickup.

## 2020-07-03 ENCOUNTER — Other Ambulatory Visit: Payer: Self-pay | Admitting: Internal Medicine

## 2020-07-03 DIAGNOSIS — M542 Cervicalgia: Secondary | ICD-10-CM | POA: Diagnosis not present

## 2020-07-03 DIAGNOSIS — M961 Postlaminectomy syndrome, not elsewhere classified: Secondary | ICD-10-CM | POA: Diagnosis not present

## 2020-07-03 NOTE — Telephone Encounter (Signed)
Cialis refill  Last 06/04/20 Next 09/09/20

## 2020-07-21 ENCOUNTER — Other Ambulatory Visit: Payer: Self-pay | Admitting: Internal Medicine

## 2020-08-13 ENCOUNTER — Encounter: Payer: Medicare Other | Admitting: Internal Medicine

## 2020-09-09 ENCOUNTER — Encounter: Payer: Medicare Other | Admitting: Internal Medicine

## 2020-09-24 ENCOUNTER — Telehealth: Payer: Self-pay

## 2020-09-24 DIAGNOSIS — M542 Cervicalgia: Secondary | ICD-10-CM | POA: Diagnosis not present

## 2020-09-24 DIAGNOSIS — M961 Postlaminectomy syndrome, not elsewhere classified: Secondary | ICD-10-CM | POA: Diagnosis not present

## 2020-09-24 NOTE — Telephone Encounter (Signed)
Pt notified samples of rybelsus is ready for pickup.

## 2020-10-22 ENCOUNTER — Telehealth (INDEPENDENT_AMBULATORY_CARE_PROVIDER_SITE_OTHER): Payer: Medicare Other | Admitting: Nurse Practitioner

## 2020-10-22 ENCOUNTER — Telehealth: Payer: Self-pay

## 2020-10-22 DIAGNOSIS — R059 Cough, unspecified: Secondary | ICD-10-CM

## 2020-10-22 DIAGNOSIS — U071 COVID-19: Secondary | ICD-10-CM | POA: Diagnosis not present

## 2020-10-22 MED ORDER — AZITHROMYCIN 250 MG PO TABS: ORAL_TABLET | ORAL | 0 refills | Status: AC

## 2020-10-22 MED ORDER — BENZONATATE 100 MG PO CAPS: 100.0000 mg | ORAL_CAPSULE | Freq: Three times a day (TID) | ORAL | 0 refills | Status: DC | PRN

## 2020-10-22 NOTE — Progress Notes (Signed)
Virtual Visit via video visit    This visit type was conducted due to national recommendations for restrictions regarding the COVID-19 Pandemic (e.g. social distancing) in an effort to limit this patient's exposure and mitigate transmission in our community.  Due to his co-morbid illnesses, this patient is at least at moderate risk for complications without adequate follow up.  This format is felt to be most appropriate for this patient at this time.  All issues noted in this document were discussed and addressed.  A limited physical exam was performed with this format.    This visit type was conducted due to national recommendations for restrictions regarding the COVID-19 Pandemic (e.g. social distancing) in an effort to limit this patient's exposure and mitigate transmission in our community.  Patients identity confirmed using two different identifiers.  This format is felt to be most appropriate for this patient at this time.  All issues noted in this document were discussed and addressed.  No physical exam was performed (except for noted visual exam findings with Video Visits).    Date:  10/22/2020   ID:  Raymond Welch, DOB Nov 10, 1950, MRN [id number]  Patient Location:  Home   Provider location:   Office  Chief Complaint:  Tested positive for Covid   History of Present Illness:    Raymond Welch is a 70 y.o. male who presents via video conferencing for a telehealth visit today.    The patient does have symptoms concerning for COVID-19 infection with  chills, cough, congestion, body aches.    He tested positive at home on Sunday. Symptoms started late Saturday with body aches, coughing, unsure of fever but feels like he has a fever, has chills and nasal congestion. No SOB. No chest pain. He is vaccinated but does not have a booster shot. His wife and daughter also have COVID.   Cough This is a new problem. The current episode started in the past 7 days. The problem has been gradually  worsening. The cough is productive of sputum. Associated symptoms include chills, headaches, myalgias and nasal congestion. Pertinent negatives include no chest pain, hemoptysis or shortness of breath.     Past Medical History:  Diagnosis Date  . Cardiomyopathy ([facility])   . Hypertension   . Low back pain   . Pure hypercholesterolemia   . Sleep apnea    Past Surgical History:  Procedure Laterality Date  . BACK SURGERY    . LEFT HEART CATH AND CORONARY ANGIOGRAPHY N/A 12/13/2017   Procedure: LEFT HEART CATH AND CORONARY ANGIOGRAPHY;  Surgeon: Tonny Bollman, MD;  Location: [facility] INVASIVE CV LAB;  Service: Cardiovascular;  Laterality: N/A;     Current Meds  Medication Sig  . amLODipine (NORVASC) 5 MG tablet Take 5 mg by mouth daily.  Marland Kitchen azithromycin (ZITHROMAX Z-PAK) 250 MG tablet Take 2 tablets (500 mg) on  Day 1,  followed by 1 tablet (250 mg) once daily on Days 2 through 5.  . benzonatate (TESSALON PERLES) 100 MG capsule Take 1 capsule (100 mg total) by mouth 3 (three) times daily as needed for cough.  . cyclobenzaprine (FLEXERIL) 10 MG tablet Take 10 mg by mouth at bedtime as needed for muscle pain.  Marland Kitchen glucose blood test strip 1 each by Other route daily as needed for other (blood sugar). Use as instructed  . losartan-hydrochlorothiazide (HYZAAR) 100-12.5 MG tablet TAKE 1 TABLET BY MOUTH EVERY DAY  . metoprolol succinate (TOPROL-XL) 50 MG 24 hr tablet TAKE 1 TABLET BY MOUTH  DAILY WITH OR IMMEDIATLEY  FOLLOWING A MEAL  . Olopatadine HCl 0.7 % SOLN Place 1 drop into both eyes 2 (two) times daily.  Letta Pate DELICA LANCETS 33G MISC 1 each by Does not apply route daily as needed (blood sugar).   Marland Kitchen oxyCODONE-acetaminophen (PERCOCET) 10-325 MG tablet Take 1 tablet by mouth every 8 (eight) hours as needed for pain.   . pravastatin (PRAVACHOL) 40 MG tablet TAKE 1 TABLET BY MOUTH  DAILY  . Semaglutide (RYBELSUS) 3 MG TABS Take by mouth daily.  . tadalafil (CIALIS) 20 MG tablet TAKE 1 TABLET BY  MOUTH ONCE DAILY AS NEEDED FOR ERECTILE DYSFUNCTION     Allergies:   Patient has no known allergies.   Social History   Tobacco Use  . Smoking status: Former Smoker    Packs/day: 0.75    Years: 40.00    Pack years: 30.00    Types: Cigarettes  . Smokeless tobacco: Never Used  . Tobacco comment: Quit 8 years ago.   Vaping Use  . Vaping Use: Never used  Substance Use Topics  . Alcohol use: No  . Drug use: Yes    Types: Oxycodone     Family Hx: The patient's family history includes Aneurysm (age of onset: 12) in his father; Diabetes in his mother; Heart attack (age of onset: 75) in his mother; Lung cancer in his brother.  ROS:   Please see the history of present illness.    Review of Systems  Constitutional: Positive for chills and malaise/fatigue.       Unsure of fever   HENT: Positive for congestion.   Respiratory: Positive for cough and sputum production. Negative for hemoptysis and shortness of breath.   Cardiovascular: Negative for chest pain and palpitations.  Gastrointestinal: Negative for constipation, diarrhea, nausea and vomiting.  Musculoskeletal: Positive for myalgias.  Neurological: Positive for headaches. Negative for weakness.    All other systems reviewed and are negative.   Labs/Other Tests and Data Reviewed:    Recent Labs: 11/15/2019: ALT 20 06/04/2020: BUN 13; Creatinine, Ser 1.24; Potassium 4.4; Sodium 140   Recent Lipid Panel Lab Results  Component Value Date/Time   CHOL 142 11/15/2019 09:43 AM   TRIG 132 11/15/2019 09:43 AM   HDL 36 (L) 11/15/2019 09:43 AM   CHOLHDL 3.9 11/15/2019 09:43 AM   LDLCALC 82 11/15/2019 09:43 AM    Wt Readings from Last 3 Encounters:  06/04/20 294 lb 12.8 oz (133.7 kg)  04/02/20 (!) 300 lb 3.2 oz (136.2 kg)  02/18/20 (!) 301 lb 3.2 oz (136.6 kg)     Exam:    Vital Signs:  There were no vitals taken for this visit.    Physical Exam Vitals and nursing note reviewed.  HENT:     Head: Normocephalic and  atraumatic.  Pulmonary:     Effort: Pulmonary effort is normal.  Neurological:     Mental Status: He is alert and oriented to person, place, and time.  Psychiatric:        Mood and Affect: Affect normal.     ASSESSMENT & PLAN:    1) COVID-19 viral infection  -Patient tested positive on at home test on Sunday. He is having symptoms. Due to his comorbidities and age, referral sent to COVID clinic for antibodies.  -Z-pak sent to pharmacy due to symptoms, age and comorbidities.  -Instructed patient to take vitamin C,D and zinc -Hydrate with plenty of water  -Take OTC coricidin for hbp for chest congestion and  cough.   2) Cough  -Take OTC delsym  -Sent prescription of Tessalon pearls to take as needed for cough   Advised patient to take Vitamin C, D, Zinc Keep yourself hydrated with a lot of water and rest. Take Tylenol or pain reliever every 4-6 hours as needed for pain/fever/body ache. Educated patient if symptoms get worse or if she experiences any SOB or pain in her legs to seek immediate emergency care. Continue to monitor your pulse oxygen. Call us if you have any questions. Quarantine for 5 days if tested positive and no symptoms or 10 days if tested positive and have symptoms. Wear a mask around other people.    COVID-19 Education: The signs and symptoms of COVID-19 were discussed with the patient and how to seek care for testing (follow up with PCP or arrange E-visit).  The importance of social distancing was discussed today.  Patient Risk:   After full review of this patients clinical status, I feel that they are at least moderate risk at this time.  Time:   Today, I have spent 20 minutes/ seconds with the patient with telehealth technology discussing above diagnoses.     Medication Adjustments/Labs and Tests Ordered: Current medicines are reviewed at length with the patient today.  Concerns regarding medicines are outlined above.   Tests Ordered: Orders Placed This  Encounter  Procedures  . Ambulatory referral for Covid Treatment    Medication Changes: Meds ordered this encounter  Medications  . azithromycin (ZITHROMAX Z-PAK) 250 MG tablet    Sig: Take 2 tablets (500 mg) on  Day 1,  followed by 1 tablet (250 mg) once daily on Days 2 through 5.    Dispense:  6 each    Refill:  0  . benzonatate (TESSALON PERLES) 100 MG capsule    Sig: Take 1 capsule (100 mg total) by mouth 3 (three) times daily as needed for cough.    Dispense:  30 capsule    Refill:  0    Disposition:  Follow up if symptoms get worse or do not improve.  Signed, Charlesetta Ivory, NP

## 2020-10-22 NOTE — Telephone Encounter (Signed)
The pt agrees to have a mychart visit, the pt is covid positive and he will try to find some one to pickup his samples of rybelsus.

## 2020-10-22 NOTE — Patient Instructions (Signed)

## 2020-10-23 ENCOUNTER — Telehealth: Payer: Self-pay

## 2020-10-23 NOTE — Telephone Encounter (Signed)
Called to discuss with patient about COVID-19 symptoms and the use of one of the available treatments for those with mild to moderate Covid symptoms and at a high risk of hospitalization.  Pt appears to qualify for outpatient treatment due to co-morbid conditions and/or a member of an at-risk group in accordance with the FDA Emergency Use Authorization.    Symptom onset: 10/18/20 Cough,congestion,body aches Vaccinated: Yes Booster? No Immunocompromised? No Qualifiers: HTN,Heart disease  Unable to reach pt - Left message and call back number [phone].   Raymond Welch

## 2020-10-25 DIAGNOSIS — Z20822 Contact with and (suspected) exposure to covid-19: Secondary | ICD-10-CM | POA: Diagnosis not present

## 2020-10-28 ENCOUNTER — Other Ambulatory Visit: Payer: Self-pay | Admitting: Internal Medicine

## 2020-10-29 NOTE — Addendum Note (Signed)
Addended by: Delma Officer on: 10/29/2020 05:50 PM   Modules accepted: Level of Service

## 2020-11-19 ENCOUNTER — Ambulatory Visit: Payer: Medicare Other | Admitting: Internal Medicine

## 2020-11-26 NOTE — Progress Notes (Signed)
Cardiology Office Note   Date:  11/27/2020   ID:  Raymond Welch, DOB 1951-06-05, MRN [id number]  PCP:  Dorothyann Peng, MD  Cardiologist:   Rollene Rotunda, MD   No chief complaint on file.     History of Present Illness: Raymond Welch is a 70 y.o. male who was referred by Dorothyann Peng, MD for evaluation of chest pain. We saw him in May 2018 for chest pain, he had a negative POET.   However he presented again in 2019 with continued pain and DOE.    Cath demonstrated mild diffuse CAD.  However, he does have a mildly reduced EF of 45%.    Since I last saw him he has done well. The patient denies any new symptoms such as chest discomfort, neck or arm discomfort. There has been no new shortness of breath, PND or orthopnea. There have been no reported palpitations, presyncope or syncope.   He still walks about 3 miles a day on some days for exercise.    Past Medical History:  Diagnosis Date  . Cardiomyopathy (HCC)   . Hypertension   . Low back pain   . Pure hypercholesterolemia   . Sleep apnea     Past Surgical History:  Procedure Laterality Date  . BACK SURGERY    . LEFT HEART CATH AND CORONARY ANGIOGRAPHY N/A 12/13/2017   Procedure: LEFT HEART CATH AND CORONARY ANGIOGRAPHY;  Surgeon: Tonny Bollman, MD;  Location: [facility] INVASIVE CV LAB;  Service: Cardiovascular;  Laterality: N/A;     Current Outpatient Medications  Medication Sig Dispense Refill  . benzonatate (TESSALON PERLES) 100 MG capsule Take 1 capsule (100 mg total) by mouth 3 (three) times daily as needed for cough. 30 capsule 0  . cyclobenzaprine (FLEXERIL) 10 MG tablet Take 10 mg by mouth at bedtime as needed for muscle pain.  2  . glucose blood test strip 1 each by Other route daily as needed for other (blood sugar). Use as instructed    . losartan-hydrochlorothiazide (HYZAAR) 100-12.5 MG tablet TAKE 1 TABLET BY MOUTH  DAILY 90 tablet 3  . metoprolol succinate (TOPROL-XL) 50 MG 24 hr tablet TAKE 1 TABLET BY MOUTH  DAILY  WITH OR IMMEDIATLEY  FOLLOWING A MEAL 90 tablet 3  . Olopatadine HCl 0.7 % SOLN Place 1 drop into both eyes 2 (two) times daily.    Letta Pate DELICA LANCETS 33G MISC 1 each by Does not apply route daily as needed (blood sugar).     Marland Kitchen oxyCODONE-acetaminophen (PERCOCET) 10-325 MG tablet Take 1 tablet by mouth every 8 (eight) hours as needed for pain.     . pravastatin (PRAVACHOL) 40 MG tablet TAKE 1 TABLET BY MOUTH  DAILY 90 tablet 3  . Semaglutide (RYBELSUS) 3 MG TABS Take by mouth daily.    Marland Kitchen spironolactone (ALDACTONE) 25 MG tablet Take 1 tablet (25 mg total) by mouth daily. 90 tablet 3  . tadalafil (CIALIS) 20 MG tablet TAKE 1 TABLET BY MOUTH ONCE DAILY AS NEEDED FOR ERECTILE DYSFUNCTION 30 tablet 0   No current facility-administered medications for this visit.    Allergies:   Patient has no known allergies.    ROS:  Please see the history of present illness.   Otherwise, review of systems are positive for none.   All other systems are reviewed and negative.    PHYSICAL EXAM: VS:  BP 140/82   Pulse 72   Ht 6\' 1"  (1.854 m)   Wt 277 lb 3.2  oz (125.7 kg)   SpO2 94%   BMI 36.57 kg/m  , BMI Body mass index is 36.57 kg/m. GENERAL:  Well appearing NECK:  No jugular venous distention, waveform within normal limits, carotid upstroke brisk and symmetric, no bruits, no thyromegaly LUNGS:  Clear to auscultation bilaterally CHEST:  Unremarkable HEART:  PMI not displaced or sustained,S1 and S2 within normal limits, no S3, no S4, no clicks, no rubs, no murmurs ABD:  Flat, positive bowel sounds normal in frequency in pitch, no bruits, no rebound, no guarding, no midline pulsatile mass, no hepatomegaly, no splenomegaly EXT:  2 plus pulses throughout, no edema, no cyanosis no clubbing   EKG:  EKG is  ordered today. The ekg ordered today demonstrates sinus rhythm, rate 56, axis within normal intervals within normal limits, no acute ST-T wave changes.   Recent Labs: 06/04/2020: BUN 13;  Creatinine, Ser 1.24; Potassium 4.4; Sodium 140    Lipid Panel    Component Value Date/Time   CHOL 142 11/15/2019 0943   TRIG 132 11/15/2019 0943   HDL 36 (L) 11/15/2019 0943   CHOLHDL 3.9 11/15/2019 0943   LDLCALC 82 11/15/2019 0943      Wt Readings from Last 3 Encounters:  11/27/20 277 lb 3.2 oz (125.7 kg)  06/04/20 294 lb 12.8 oz (133.7 kg)  04/02/20 (!) 300 lb 3.2 oz (136.2 kg)      Other studies Reviewed: Additional studies/ records that were reviewed today include: Labs. Review of the above records demonstrates:  Please see elsewhere in the note.     ASSESSMENT AND PLAN:  CHEST PAIN:  Has had no further chest pain.  No further work-up.  Only mild plaque.   DM: His A1c was 5.8 which was down from 6.2.   DYSLIPIDEMIA:    LDL was 82 with an HDL of 36.  No change in therapy.   MORBID OBESITY:  We again talked about the need for lower carbohydrates.  SLEEP APNEA:    Uses CPAP.  No change in therapy.  CARDIOMYOPATHY:     He has had a mildly reduced ejection fraction.  I am going to stop his Norvasc.  I am going to add spironolactone 25 mg daily.  He will need a basic metabolic profile in 2 weeks and again in 1 month.  We talked about potential for gynecomastia.    Current medicines are reviewed at length with the patient today.  The patient does not have concerns regarding medicines.  The following changes have been made:   Labs/ tests ordered today include: None  Orders Placed This Encounter  Procedures  . Basic Metabolic Panel (BMET)  . Basic Metabolic Panel (BMET)  . EKG 12-Lead     Disposition:   FU with me in 1 year   Signed, Rollene Rotunda, MD  11/27/2020 8:07 AM     [facility]

## 2020-11-27 ENCOUNTER — Ambulatory Visit: Payer: Medicare Other | Admitting: Cardiology

## 2020-11-27 ENCOUNTER — Other Ambulatory Visit: Payer: Self-pay

## 2020-11-27 ENCOUNTER — Encounter: Payer: Self-pay | Admitting: Cardiology

## 2020-11-27 VITALS — BP 140/82 | HR 72 | Ht 73.0 in | Wt 277.2 lb

## 2020-11-27 DIAGNOSIS — I420 Dilated cardiomyopathy: Secondary | ICD-10-CM

## 2020-11-27 DIAGNOSIS — R072 Precordial pain: Secondary | ICD-10-CM | POA: Diagnosis not present

## 2020-11-27 DIAGNOSIS — E785 Hyperlipidemia, unspecified: Secondary | ICD-10-CM

## 2020-11-27 DIAGNOSIS — Z79899 Other long term (current) drug therapy: Secondary | ICD-10-CM | POA: Diagnosis not present

## 2020-11-27 MED ORDER — SPIRONOLACTONE 25 MG PO TABS: 25.0000 mg | ORAL_TABLET | Freq: Every day | ORAL | 3 refills | Status: DC

## 2020-11-27 NOTE — Patient Instructions (Signed)
Medication Instructions:  STOP- Amlodipine START- Spironolactone 25 mg by mouth daily   *If you need a refill on your cardiac medications before your next appointment, please call your pharmacy*   Lab Work: BMP in 2 weeks BMP in 1 Month  If you have labs (blood work) drawn today and your tests are completely normal, you will receive your results only by: Marland Kitchen MyChart Message (if you have MyChart) OR . A paper copy in the mail If you have any lab test that is abnormal or we need to change your treatment, we will call you to review the results.   Testing/Procedures: None Ordered   Follow-Up: At [facility], you and your health needs are our priority.  As part of our continuing mission to provide you with exceptional heart care, we have created designated Provider Care Teams.  These Care Teams include your primary Cardiologist (physician) and Advanced Practice Providers (APPs -  Physician Assistants and Nurse Practitioners) who all work together to provide you with the care you need, when you need it.  We recommend signing up for the patient portal called "MyChart".  Sign up information is provided on this After Visit Summary.  MyChart is used to connect with patients for Virtual Visits (Telemedicine).  Patients are able to view lab/test results, encounter notes, upcoming appointments, etc.  Non-urgent messages can be sent to your provider as well.   To learn more about what you can do with MyChart, go to [email or web address].    Your next appointment:   1 year(s)  The format for your next appointment:   In Person  Provider:   You may see Rollene Rotunda, MD or one of the following Advanced Practice Providers on your designated Care Team:    Theodore Demark, PA-C  Joni Reining, DNP, ANP

## 2020-12-10 ENCOUNTER — Other Ambulatory Visit: Payer: Self-pay | Admitting: Internal Medicine

## 2020-12-11 DIAGNOSIS — G4733 Obstructive sleep apnea (adult) (pediatric): Secondary | ICD-10-CM | POA: Diagnosis not present

## 2020-12-11 DIAGNOSIS — Z79899 Other long term (current) drug therapy: Secondary | ICD-10-CM | POA: Diagnosis not present

## 2020-12-11 LAB — BASIC METABOLIC PANEL
BUN/Creatinine Ratio: 11 (ref 10–24)
BUN: 13 mg/dL (ref 8–27)
CO2: 25 mmol/L (ref 20–29)
Calcium: 9.7 mg/dL (ref 8.6–10.2)
Chloride: 101 mmol/L (ref 96–106)
Creatinine, Ser: 1.22 mg/dL (ref 0.76–1.27)
Glucose: 95 mg/dL (ref 65–99)
Potassium: 4.2 mmol/L (ref 3.5–5.2)
Sodium: 140 mmol/L (ref 134–144)
eGFR: 64 mL/min/{1.73_m2} (ref >59)

## 2020-12-24 DIAGNOSIS — M542 Cervicalgia: Secondary | ICD-10-CM | POA: Diagnosis not present

## 2020-12-24 DIAGNOSIS — M961 Postlaminectomy syndrome, not elsewhere classified: Secondary | ICD-10-CM | POA: Diagnosis not present

## 2021-01-08 ENCOUNTER — Ambulatory Visit (INDEPENDENT_AMBULATORY_CARE_PROVIDER_SITE_OTHER): Payer: Medicare Other

## 2021-01-08 ENCOUNTER — Ambulatory Visit (INDEPENDENT_AMBULATORY_CARE_PROVIDER_SITE_OTHER): Payer: Medicare Other | Admitting: Internal Medicine

## 2021-01-08 ENCOUNTER — Other Ambulatory Visit: Payer: Self-pay

## 2021-01-08 ENCOUNTER — Encounter: Payer: Self-pay | Admitting: Internal Medicine

## 2021-01-08 VITALS — BP 122/78 | HR 75 | Temp 98.2°F | Ht 73.0 in | Wt 273.4 lb

## 2021-01-08 DIAGNOSIS — I10 Essential (primary) hypertension: Secondary | ICD-10-CM

## 2021-01-08 DIAGNOSIS — R351 Nocturia: Secondary | ICD-10-CM | POA: Diagnosis not present

## 2021-01-08 DIAGNOSIS — Z0000 Encounter for general adult medical examination without abnormal findings: Secondary | ICD-10-CM

## 2021-01-08 DIAGNOSIS — N182 Chronic kidney disease, stage 2 (mild): Secondary | ICD-10-CM

## 2021-01-08 DIAGNOSIS — Z6836 Body mass index (BMI) 36.0-36.9, adult: Secondary | ICD-10-CM

## 2021-01-08 DIAGNOSIS — I129 Hypertensive chronic kidney disease with stage 1 through stage 4 chronic kidney disease, or unspecified chronic kidney disease: Secondary | ICD-10-CM

## 2021-01-08 DIAGNOSIS — R7303 Prediabetes: Secondary | ICD-10-CM | POA: Diagnosis not present

## 2021-01-08 LAB — CBC WITH DIFFERENTIAL/PLATELET
Basophils Absolute: 0 10*3/uL (ref 0.0–0.2)
Basos: 1 %
EOS (ABSOLUTE): 0.1 10*3/uL (ref 0.0–0.4)
Eos: 1 %
Hematocrit: 43.1 % (ref 37.5–51.0)
Hemoglobin: 14 g/dL (ref 13.0–17.7)
Immature Grans (Abs): 0 10*3/uL (ref 0.0–0.1)
Immature Granulocytes: 0 %
Lymphocytes Absolute: 1.5 10*3/uL (ref 0.7–3.1)
Lymphs: 34 %
MCH: 30.4 pg (ref 26.6–33.0)
MCHC: 32.5 g/dL (ref 31.5–35.7)
MCV: 94 fL (ref 79–97)
Monocytes Absolute: 0.6 10*3/uL (ref 0.1–0.9)
Monocytes: 13 %
Neutrophils Absolute: 2.3 10*3/uL (ref 1.4–7.0)
Neutrophils: 51 %
Platelets: 190 10*3/uL (ref 150–450)
RBC: 4.61 x10E6/uL (ref 4.14–5.80)
RDW: 12.6 % (ref 11.6–15.4)
WBC: 4.4 10*3/uL (ref 3.4–10.8)

## 2021-01-08 NOTE — Progress Notes (Signed)
I,Raymond Welch as a Neurosurgeon for Raymond Aliment, MD.,have documented all relevant documentation on the behalf of Raymond Aliment, MD,as directed by  Raymond Aliment, MD while in the presence of Raymond Aliment, MD.  This visit occurred during the SARS-CoV-2 public health emergency.  Safety protocols were in place, including screening questions prior to the visit, additional usage of staff PPE, and extensive cleaning of exam room while observing appropriate contact time as indicated for disinfecting solutions.  Subjective:     Patient ID: Raymond Welch , male    DOB: 10-25-1950 , 70 y.o.   MRN: [id number]   Chief Complaint  Patient presents with   Prediabetes   Hypertension    HPI  The patient is here to day for a follow-up on pre-diabetes, and hypertension. He reports compliance with meds. Denies headaches, chest pain and shortness of breath. He is also scheduled for AWV with [facility] advisor today.   Hypertension This is a chronic problem. The current episode started more than 1 year ago. The problem has been gradually improving since onset. The problem is controlled. Pertinent negatives include no blurred vision, chest pain, palpitations or shortness of breath. Past treatments include angiotensin blockers and diuretics. The current treatment provides moderate improvement. Compliance problems include exercise.  Hypertensive end-organ damage includes kidney disease. There is no history of hyperaldosteronism, hypercortisolism or sleep apnea.    Past Medical History:  Diagnosis Date   Cardiomyopathy (HCC)    Hypertension    Low back pain    Pure hypercholesterolemia    Sleep apnea      Family History  Problem Relation Age of Onset   Diabetes Mother    Heart attack Mother 19       Died age 59   Aneurysm Father 12   Lung cancer Brother      Current Outpatient Medications:    benzonatate (TESSALON PERLES) 100 MG capsule, Take 1 capsule (100 mg total) by mouth 3 (three) times  daily as needed for cough. (Patient not taking: Reported on 01/08/2021), Disp: 30 capsule, Rfl: 0   cyclobenzaprine (FLEXERIL) 10 MG tablet, Take 10 mg by mouth at bedtime as needed for muscle pain., Disp: , Rfl: 2   glucose blood test strip, 1 each by Other route daily as needed for other (blood sugar). Use as instructed, Disp: , Rfl:    losartan-hydrochlorothiazide (HYZAAR) 100-12.5 MG tablet, TAKE 1 TABLET BY MOUTH  DAILY, Disp: 90 tablet, Rfl: 3   metoprolol succinate (TOPROL-XL) 50 MG 24 hr tablet, TAKE 1 TABLET BY MOUTH  DAILY WITH OR IMMEDIATLEY  FOLLOWING A MEAL, Disp: 90 tablet, Rfl: 3   Olopatadine HCl 0.7 % SOLN, Place 1 drop into both eyes 2 (two) times daily., Disp: , Rfl:    ONETOUCH DELICA LANCETS 33G MISC, 1 each by Does not apply route daily as needed (blood sugar). , Disp: , Rfl:    oxyCODONE-acetaminophen (PERCOCET) 10-325 MG tablet, Take 1 tablet by mouth every 8 (eight) hours as needed for pain. , Disp: , Rfl:    pravastatin (PRAVACHOL) 40 MG tablet, TAKE 1 TABLET BY MOUTH  DAILY (Patient not taking: Reported on 01/08/2021), Disp: 90 tablet, Rfl: 3   Semaglutide (RYBELSUS) 3 MG TABS, Take by mouth daily. (Patient not taking: Reported on 01/08/2021), Disp: , Rfl:    spironolactone (ALDACTONE) 25 MG tablet, Take 1 tablet (25 mg total) by mouth daily., Disp: 90 tablet, Rfl: 3   tadalafil (CIALIS) 20 MG tablet, TAKE  1 TABLET BY MOUTH ONCE DAILY AS NEEDED FOR ERECTILE DYSFUNCTION, Disp: 30 tablet, Rfl: 0   No Known Allergies   Review of Systems  Constitutional: Negative.   Eyes:  Negative for blurred vision.  Respiratory: Negative.  Negative for shortness of breath.   Cardiovascular: Negative.  Negative for chest pain and palpitations.  Gastrointestinal: Negative.   Neurological: Negative.   Psychiatric/Behavioral: Negative.      Today's Vitals   01/08/21 0952  BP: 122/78  Pulse: 75  Temp: 98.2 F (36.8 C)  Weight: 273 lb 5.9 oz (124 kg)  Height: 6\' 1"  (1.854 m)   Body mass  index is 36.07 kg/m.  Wt Readings from Last 3 Encounters:  01/08/21 273 lb 5.9 oz (124 kg)  01/08/21 273 lb 6.4 oz (124 kg)  11/27/20 277 lb 3.2 oz (125.7 kg)     Objective:  Physical Exam Vitals and nursing note reviewed.  Constitutional:      Appearance: Normal appearance.  HENT:     Nose:     Comments: Masked     Mouth/Throat:     Comments: Masked  Cardiovascular:     Rate and Rhythm: Normal rate and regular rhythm.     Heart sounds: Normal heart sounds.  Pulmonary:     Effort: Pulmonary effort is normal.     Breath sounds: Normal breath sounds.  Skin:    General: Skin is warm.  Neurological:     General: No focal deficit present.     Mental Status: He is alert.  Psychiatric:        Mood and Affect: Mood normal.        Assessment And Plan:     1. Pre-diabetes Comments: Chronic, encouraged to limit intake of sweetened beverages. I will check a1c/insulin level today. I question compliance with Rybelsus. He told CMA he is not taking medication, but assures me of compliance. Pt encouraged to be truthful with me so I can treat him properly.  - Hemoglobin A1c - Insulin, random([phone])  2. Parenchymal renal hypertension, stage 1 through stage 4 or unspecified chronic kidney disease Comments: Chronic, well controlled. He is encouraged to limit his salt intake. I will check renal function today.  - Liver Profile - Lipid panel  3. Chronic renal disease, stage II Comments: Chronic, encouraged to keep BP controlled and stay hydrated to limit risk of progression of CKD.   4. Nocturia Comments: I will check PSA today.  - PSA, total and free  5. Class 2 severe obesity due to excess calories with serious comorbidity and body mass index (BMI) of 36.0 to 36.9 in adult [facility])  He was congratulated on his 29 pound weight loss since April 2021. He is encouraged to initially strive for BMI less than 30 to decrease cardiac risk. He is advised to exercise no less than 150 minutes per  week.     Patient was given opportunity to ask questions. Patient verbalized understanding of the plan and was able to repeat key elements of the plan. All questions were answered to their satisfaction.   I, May 2021, MD, have reviewed all documentation for this visit. The documentation on 01/08/21 for the exam, diagnosis, procedures, and orders are all accurate and complete.   IF YOU HAVE BEEN REFERRED TO A SPECIALIST, IT MAY TAKE 1-2 WEEKS TO SCHEDULE/PROCESS THE REFERRAL. IF YOU HAVE NOT HEARD FROM US/SPECIALIST IN TWO WEEKS, PLEASE GIVE 03/10/21 A CALL AT [phone].   THE PATIENT IS  ENCOURAGED TO PRACTICE SOCIAL DISTANCING DUE TO THE COVID-19 PANDEMIC.   

## 2021-01-08 NOTE — Progress Notes (Signed)
This visit occurred during the SARS-CoV-2 public health emergency.  Safety protocols were in place, including screening questions prior to the visit, additional usage of staff PPE, and extensive cleaning of exam room while observing appropriate contact time as indicated for disinfecting solutions.  Subjective:   Raymond Welch is a 70 y.o. male who presents for Medicare Annual/Subsequent preventive examination.  Review of Systems     Cardiac Risk Factors include: advanced age (>4men, >48 women);dyslipidemia;hypertension;male gender;obesity (BMI >30kg/m2)     Objective:    Today's Vitals   01/08/21 0929 01/08/21 0937  BP: 122/78   Pulse: 75   Temp: 98.2 F (36.8 C)   TempSrc: Oral   SpO2: 98%   Weight: 273 lb 6.4 oz (124 kg)   Height: 6\' 1"  (1.854 m)   PainSc:  8    Body mass index is 36.07 kg/m.  Advanced Directives 01/08/2021 11/15/2019 05/02/2019 07/19/2018 06/08/2018 12/13/2017 07/24/2017  Does Patient Have a Medical Advance Directive? No No No No No No No  Would patient like information on creating a medical advance directive? No - Patient declined No - Patient declined No - Patient declined Yes (MAU/Ambulatory/Procedural Areas - Information given) No - Patient declined No - Patient declined -    Current Medications (verified) Outpatient Encounter Medications as of 01/08/2021  Medication Sig   cyclobenzaprine (FLEXERIL) 10 MG tablet Take 10 mg by mouth at bedtime as needed for muscle pain.   glucose blood test strip 1 each by Other route daily as needed for other (blood sugar). Use as instructed   losartan-hydrochlorothiazide (HYZAAR) 100-12.5 MG tablet TAKE 1 TABLET BY MOUTH  DAILY   metoprolol succinate (TOPROL-XL) 50 MG 24 hr tablet TAKE 1 TABLET BY MOUTH  DAILY WITH OR IMMEDIATLEY  FOLLOWING A MEAL   Olopatadine HCl 0.7 % SOLN Place 1 drop into both eyes 2 (two) times daily.   ONETOUCH DELICA LANCETS 33G MISC 1 each by Does not apply route daily as needed (blood sugar).     oxyCODONE-acetaminophen (PERCOCET) 10-325 MG tablet Take 1 tablet by mouth every 8 (eight) hours as needed for pain.    spironolactone (ALDACTONE) 25 MG tablet Take 1 tablet (25 mg total) by mouth daily.   tadalafil (CIALIS) 20 MG tablet TAKE 1 TABLET BY MOUTH ONCE DAILY AS NEEDED FOR ERECTILE DYSFUNCTION   benzonatate (TESSALON PERLES) 100 MG capsule Take 1 capsule (100 mg total) by mouth 3 (three) times daily as needed for cough. (Patient not taking: Reported on 01/08/2021)   pravastatin (PRAVACHOL) 40 MG tablet TAKE 1 TABLET BY MOUTH  DAILY (Patient not taking: Reported on 01/08/2021)   Semaglutide (RYBELSUS) 3 MG TABS Take by mouth daily. (Patient not taking: Reported on 01/08/2021)   No facility-administered encounter medications on file as of 01/08/2021.    Allergies (verified) Patient has no known allergies.   History: Past Medical History:  Diagnosis Date   Cardiomyopathy (HCC)    Hypertension    Low back pain    Pure hypercholesterolemia    Sleep apnea    Past Surgical History:  Procedure Laterality Date   BACK SURGERY     LEFT HEART CATH AND CORONARY ANGIOGRAPHY N/A 12/13/2017   Procedure: LEFT HEART CATH AND CORONARY ANGIOGRAPHY;  Surgeon: 12/15/2017, MD;  Location: [facility] INVASIVE CV LAB;  Service: Cardiovascular;  Laterality: N/A;   Family History  Problem Relation Age of Onset   Diabetes Mother    Heart attack Mother 41       Died  age 63   Aneurysm Father 70   Lung cancer Brother    Social History   Socioeconomic History   Marital status: Married    Spouse name: Not on file   Number of children: 8   Years of education: Not on file   Highest education level: Not on file  Occupational History   Occupation: Location manager   Occupation: retired  Tobacco Use   Smoking status: Former    Packs/day: 0.75    Years: 40.00    Pack years: 30.00    Types: Cigarettes   Smokeless tobacco: Never   Tobacco comments:    Quit 8 years ago.   Vaping Use   Vaping Use: Never  used  Substance and Sexual Activity   Alcohol use: No   Drug use: Yes    Types: Oxycodone   Sexual activity: Yes  Other Topics Concern   Not on file  Social History Narrative   Lives at home with wife.     Social Determinants of Health   Financial Resource Strain: Low Risk    Difficulty of Paying Living Expenses: Not hard at all  Food Insecurity: No Food Insecurity   Worried About Programme researcher, broadcasting/film/video in the Last Year: Never true   Ran Out of Food in the Last Year: Never true  Transportation Needs: No Transportation Needs   Lack of Transportation (Medical): No   Lack of Transportation (Non-Medical): No  Physical Activity: Sufficiently Active   Days of Exercise per Week: 7 days   Minutes of Exercise per Session: 60 min  Stress: No Stress Concern Present   Feeling of Stress : Not at all  Social Connections: Not on file    Tobacco Counseling Counseling given: Not Answered Tobacco comments: Quit 8 years ago.    Clinical Intake:  Pre-visit preparation completed: Yes  Pain : 0-10 Pain Score: 8  Pain Type: Chronic pain Pain Location: Back Pain Orientation: Lower Pain Descriptors / Indicators: Aching Pain Onset: More than a month ago Pain Frequency: Constant     Nutritional Status: BMI > 30  Obese Nutritional Risks: None Diabetes: No  How often do you need to have someone help you when you read instructions, pamphlets, or other written materials from your doctor or pharmacy?: 1 - Never What is the last grade level you completed in school?: 7th grade  Diabetic? no  Interpreter Needed?: No  Information entered by :: NAllen LPN   Activities of Daily Living In your present state of health, do you have any difficulty performing the following activities: 01/08/2021  Hearing? Y  Comment a little  Vision? N  Difficulty concentrating or making decisions? N  Walking or climbing stairs? N  Dressing or bathing? N  Doing errands, shopping? N  Preparing Food and eating  ? N  Using the Toilet? N  In the past six months, have you accidently leaked urine? N  Do you have problems with loss of bowel control? N  Managing your Medications? N  Managing your Finances? N  Housekeeping or managing your Housekeeping? N  Some recent data might be hidden    Patient Care Team: Dorothyann Peng, MD as PCP - General (Internal Medicine) Rollene Rotunda, MD as PCP - Cardiology (Cardiology)  Indicate any recent Medical Services you may have received from other than [facility] providers in the past year (date may be approximate).     Assessment:   This is a routine wellness examination for Raymond Welch.  Hearing/Vision screen Vision  Screening - Comments:: Regular eye exams,   Dietary issues and exercise activities discussed: Current Exercise Habits: Home exercise routine, Type of exercise: strength training/weights, Time (Minutes): 60, Frequency (Times/Week): 7, Weekly Exercise (Minutes/Week): 420   Goals Addressed             This Visit's Progress    Patient Stated       01/08/2021, no goals        Depression Screen PHQ 2/9 Scores 01/08/2021 11/15/2019 11/15/2019 05/02/2019 01/30/2019 09/13/2018 07/19/2018  PHQ - 2 Score 0 0 0 0 0 0 0  PHQ- 9 Score - 0 - 0 - - 0    Fall Risk Fall Risk  01/08/2021 11/15/2019 11/15/2019 05/02/2019 01/30/2019  Falls in the past year? 0 0 0 0 0  Number falls in past yr: - - - 0 -  Risk for fall due to : Medication side effect Medication side effect - Medication side effect -  Follow up Falls evaluation completed;Education provided;Falls prevention discussed Falls evaluation completed;Education provided;Falls prevention discussed - Falls evaluation completed;Education provided;Falls prevention discussed -    FALL RISK PREVENTION PERTAINING TO THE HOME:  Any stairs in or around the home? Yes  If so, are there any without handrails? No  Home free of loose throw rugs in walkways, pet beds, electrical cords, etc? Yes  Adequate lighting in your home  to reduce risk of falls? Yes   ASSISTIVE DEVICES UTILIZED TO PREVENT FALLS:  Life alert? No  Use of a cane, walker or w/c? No  Grab bars in the bathroom? No  Shower chair or bench in shower? No  Elevated toilet seat or a handicapped toilet? No   TIMED UP AND GO:  Was the test performed? No .    Gait steady and fast without use of assistive device  Cognitive Function:     6CIT Screen 01/08/2021 11/15/2019 05/02/2019 07/19/2018  What Year? 0 points 0 points 0 points 0 points  What month? 0 points 0 points 0 points 0 points  What time? 0 points 0 points 0 points 0 points  Count back from 20 0 points 0 points 0 points 0 points  Months in reverse 4 points 4 points 4 points 2 points  Repeat phrase 10 points 2 points 2 points 2 points  Total Score 14 6 6 4     Immunizations Immunization History  Administered Date(s) Administered   PFIZER(Purple Top)SARS-COV-2 Vaccination 12/07/2019, 12/28/2019    TDAP status: Up to date  Flu Vaccine status: Declined, Education has been provided regarding the importance of this vaccine but patient still declined. Advised may receive this vaccine at local pharmacy or Health Dept. Aware to provide a copy of the vaccination record if obtained from local pharmacy or Health Dept. Verbalized acceptance and understanding.  Pneumococcal vaccine status: Declined,  Education has been provided regarding the importance of this vaccine but patient still declined. Advised may receive this vaccine at local pharmacy or Health Dept. Aware to provide a copy of the vaccination record if obtained from local pharmacy or Health Dept. Verbalized acceptance and understanding.   Covid-19 vaccine status: Completed vaccines  Qualifies for Shingles Vaccine? Yes   Zostavax completed No   Shingrix Completed?: No.    Education has been provided regarding the importance of this vaccine. Patient has been advised to call insurance company to determine out of pocket expense if they  have not yet received this vaccine. Advised may also receive vaccine at local pharmacy or Health Dept. Verbalized  acceptance and understanding.  Screening Tests Health Maintenance  Topic Date Due   Pneumococcal Vaccine 66-22 Years old (1 - PCV) Never done   COVID-19 Vaccine (3 - Pfizer risk series) 01/25/2020   FOOT EXAM  07/16/2020   HEMOGLOBIN A1C  12/02/2020   Zoster Vaccines- Shingrix (1 of 2) 04/10/2021 (Originally 02/14/1970)   PNA vac Low Risk Adult (1 of 2 - PCV13) 01/08/2022 (Originally 02/15/2015)   OPHTHALMOLOGY EXAM  02/18/2021   INFLUENZA VACCINE  03/02/2021   COLONOSCOPY (Pts 45-21yrs Insurance coverage will need to be confirmed)  10/02/2023   TETANUS/TDAP  02/14/2025   Hepatitis C Screening  Completed   HPV VACCINES  Aged Out    Health Maintenance  Health Maintenance Due  Topic Date Due   Pneumococcal Vaccine 67-63 Years old (1 - PCV) Never done   COVID-19 Vaccine (3 - Pfizer risk series) 01/25/2020   FOOT EXAM  07/16/2020   HEMOGLOBIN A1C  12/02/2020    Colorectal cancer screening: Type of screening: Colonoscopy. Completed 10/01/2013. Repeat every 10 years  Lung Cancer Screening: (Low Dose CT Chest recommended if Age 54-80 years, 30 pack-year currently smoking OR have quit w/in 15years.) does not qualify.   Lung Cancer Screening Referral: no  Additional Screening:  Hepatitis C Screening: does qualify; Completed 02/15/2015  Vision Screening: Recommended annual ophthalmology exams for early detection of glaucoma and other disorders of the eye. Is the patient up to date with their annual eye exam?  Yes  Who is the provider or what is the name of the office in which the patient attends annual eye exams? Can't remember name If pt is not established with a provider, would they like to be referred to a provider to establish care? No .   Dental Screening: Recommended annual dental exams for proper oral hygiene  Community Resource Referral / Chronic Care Management: CRR  required this visit?  No   CCM required this visit?  No      Plan:     I have personally reviewed and noted the following in the patient's chart:   Medical and social history Use of alcohol, tobacco or illicit drugs  Current medications and supplements including opioid prescriptions. Patient is currently taking opioid prescriptions. Information provided to patient regarding non-opioid alternatives. Patient advised to discuss non-opioid treatment plan with their provider. Functional ability and status Nutritional status Physical activity Advanced directives List of other physicians Hospitalizations, surgeries, and ER visits in previous 12 months Vitals Screenings to include cognitive, depression, and falls Referrals and appointments  In addition, I have reviewed and discussed with patient certain preventive protocols, quality metrics, and best practice recommendations. A written personalized care plan for preventive services as well as general preventive health recommendations were provided to patient.     Barb Merino, LPN   1/0/2585   Nurse Notes:

## 2021-01-08 NOTE — Patient Instructions (Signed)

## 2021-01-08 NOTE — Patient Instructions (Signed)
Raymond Welch , Thank you for taking time to come for your Medicare Wellness Visit. I appreciate your ongoing commitment to your health goals. Please review the following plan we discussed and let me know if I can assist you in the future.   Screening recommendations/referrals: Colonoscopy: completed 10/01/2013 Recommended yearly ophthalmology/optometry visit for glaucoma screening and checkup Recommended yearly dental visit for hygiene and checkup  Vaccinations: Influenza vaccine: decline Pneumococcal vaccine: decline Tdap vaccine: completed 02/15/2015 Shingles vaccine: decline   Covid-19: 12/28/2019, 12/07/2019  Advanced directives: Advance directive discussed with you today. Even though you declined this today please call our office should you change your mind and we can give you the proper paperwork for you to fill out.  Conditions/risks identified: none  Next appointment: Follow up in one year for your annual wellness visit.   Preventive Care 30 Years and Older, Male Preventive care refers to lifestyle choices and visits with your health care provider that can promote health and wellness. What does preventive care include? A yearly physical exam. This is also called an annual well check. Dental exams once or twice a year. Routine eye exams. Ask your health care provider how often you should have your eyes checked. Personal lifestyle choices, including: Daily care of your teeth and gums. Regular physical activity. Eating a healthy diet. Avoiding tobacco and drug use. Limiting alcohol use. Practicing safe sex. Taking low doses of aspirin every day. Taking vitamin and mineral supplements as recommended by your health care provider. What happens during an annual well check? The services and screenings done by your health care provider during your annual well check will depend on your age, overall health, lifestyle risk factors, and family history of disease. Counseling  Your health care  provider may ask you questions about your: Alcohol use. Tobacco use. Drug use. Emotional well-being. Home and relationship well-being. Sexual activity. Eating habits. History of falls. Memory and ability to understand (cognition). Work and work Astronomer. Screening  You may have the following tests or measurements: Height, weight, and BMI. Blood pressure. Lipid and cholesterol levels. These may be checked every 5 years, or more frequently if you are over 24 years old. Skin check. Lung cancer screening. You may have this screening every year starting at age 5 if you have a 30-pack-year history of smoking and currently smoke or have quit within the past 15 years. Fecal occult blood test (FOBT) of the stool. You may have this test every year starting at age 77. Flexible sigmoidoscopy or colonoscopy. You may have a sigmoidoscopy every 5 years or a colonoscopy every 10 years starting at age 52. Prostate cancer screening. Recommendations will vary depending on your family history and other risks. Hepatitis C blood test. Hepatitis B blood test. Sexually transmitted disease (STD) testing. Diabetes screening. This is done by checking your blood sugar (glucose) after you have not eaten for a while (fasting). You may have this done every 1-3 years. Abdominal aortic aneurysm (AAA) screening. You may need this if you are a current or former smoker. Osteoporosis. You may be screened starting at age 59 if you are at high risk. Talk with your health care provider about your test results, treatment options, and if necessary, the need for more tests. Vaccines  Your health care provider may recommend certain vaccines, such as: Influenza vaccine. This is recommended every year. Tetanus, diphtheria, and acellular pertussis (Tdap, Td) vaccine. You may need a Td booster every 10 years. Zoster vaccine. You may need this after age  60. Pneumococcal 13-valent conjugate (PCV13) vaccine. One dose is  recommended after age 107. Pneumococcal polysaccharide (PPSV23) vaccine. One dose is recommended after age 52. Talk to your health care provider about which screenings and vaccines you need and how often you need them. This information is not intended to replace advice given to you by your health care provider. Make sure you discuss any questions you have with your health care provider. Document Released: 08/15/2015 Document Revised: 04/07/2016 Document Reviewed: 05/20/2015 Elsevier Interactive Patient Education  2017 [employer] Prevention in the Home Falls can cause injuries. They can happen to people of all ages. There are many things you can do to make your home safe and to help prevent falls. What can I do on the outside of my home? Regularly fix the edges of walkways and driveways and fix any cracks. Remove anything that might make you trip as you walk through a door, such as a raised step or threshold. Trim any bushes or trees on the path to your home. Use bright outdoor lighting. Clear any walking paths of anything that might make someone trip, such as rocks or tools. Regularly check to see if handrails are loose or broken. Make sure that both sides of any steps have handrails. Any raised decks and porches should have guardrails on the edges. Have any leaves, snow, or ice cleared regularly. Use sand or salt on walking paths during winter. Clean up any spills in your garage right away. This includes oil or grease spills. What can I do in the bathroom? Use night lights. Install grab bars by the toilet and in the tub and shower. Do not use towel bars as grab bars. Use non-skid mats or decals in the tub or shower. If you need to sit down in the shower, use a plastic, non-slip stool. Keep the floor dry. Clean up any water that spills on the floor as soon as it happens. Remove soap buildup in the tub or shower regularly. Attach bath mats securely with double-sided non-slip rug  tape. Do not have throw rugs and other things on the floor that can make you trip. What can I do in the bedroom? Use night lights. Make sure that you have a light by your bed that is easy to reach. Do not use any sheets or blankets that are too big for your bed. They should not hang down onto the floor. Have a firm chair that has side arms. You can use this for support while you get dressed. Do not have throw rugs and other things on the floor that can make you trip. What can I do in the kitchen? Clean up any spills right away. Avoid walking on wet floors. Keep items that you use a lot in easy-to-reach places. If you need to reach something above you, use a strong step stool that has a grab bar. Keep electrical cords out of the way. Do not use floor polish or wax that makes floors slippery. If you must use wax, use non-skid floor wax. Do not have throw rugs and other things on the floor that can make you trip. What can I do with my stairs? Do not leave any items on the stairs. Make sure that there are handrails on both sides of the stairs and use them. Fix handrails that are broken or loose. Make sure that handrails are as long as the stairways. Check any carpeting to make sure that it is firmly attached to the stairs. Fix  any carpet that is loose or worn. Avoid having throw rugs at the top or bottom of the stairs. If you do have throw rugs, attach them to the floor with carpet tape. Make sure that you have a light switch at the top of the stairs and the bottom of the stairs. If you do not have them, ask someone to add them for you. What else can I do to help prevent falls? Wear shoes that: Do not have high heels. Have rubber bottoms. Are comfortable and fit you well. Are closed at the toe. Do not wear sandals. If you use a stepladder: Make sure that it is fully opened. Do not climb a closed stepladder. Make sure that both sides of the stepladder are locked into place. Ask someone to  hold it for you, if possible. Clearly mark and make sure that you can see: Any grab bars or handrails. First and last steps. Where the edge of each step is. Use tools that help you move around (mobility aids) if they are needed. These include: Canes. Walkers. Scooters. Crutches. Turn on the lights when you go into a dark area. Replace any light bulbs as soon as they burn out. Set up your furniture so you have a clear path. Avoid moving your furniture around. If any of your floors are uneven, fix them. If there are any pets around you, be aware of where they are. Review your medicines with your doctor. Some medicines can make you feel dizzy. This can increase your chance of falling. Ask your doctor what other things that you can do to help prevent falls. This information is not intended to replace advice given to you by your health care provider. Make sure you discuss any questions you have with your health care provider. Document Released: 05/15/2009 Document Revised: 12/25/2015 Document Reviewed: 08/23/2014 Elsevier Interactive Patient Education  2017 [employer].

## 2021-01-09 LAB — HEPATIC FUNCTION PANEL
ALT: 18 IU/L (ref 0–44)
AST: 27 IU/L (ref 0–40)
Albumin: 4.5 g/dL (ref 3.8–4.8)
Alkaline Phosphatase: 50 IU/L (ref 44–121)
Bilirubin Total: 0.4 mg/dL (ref 0.0–1.2)
Bilirubin, Direct: 0.1 mg/dL (ref 0.00–0.40)
Total Protein: 7.5 g/dL (ref 6.0–8.5)

## 2021-01-09 LAB — PSA, TOTAL AND FREE
PSA, Free Pct: 42.9 %
PSA, Free: 0.3 ng/mL
Prostate Specific Ag, Serum: 0.7 ng/mL (ref 0.0–4.0)

## 2021-01-09 LAB — HEMOGLOBIN A1C
Est. average glucose Bld gHb Est-mCnc: 111 mg/dL
Hgb A1c MFr Bld: 5.5 % (ref 4.8–5.6)

## 2021-01-09 LAB — LIPID PANEL
Chol/HDL Ratio: 3.6 ratio (ref 0.0–5.0)
Cholesterol, Total: 164 mg/dL (ref 100–199)
HDL: 45 mg/dL (ref >39)
LDL Chol Calc (NIH): 97 mg/dL (ref 0–99)
Triglycerides: 124 mg/dL (ref 0–149)
VLDL Cholesterol Cal: 22 mg/dL (ref 5–40)

## 2021-01-09 LAB — INSULIN, RANDOM: INSULIN: 40.7 u[IU]/mL — ABNORMAL HIGH (ref 2.6–24.9)

## 2021-01-19 DIAGNOSIS — I420 Dilated cardiomyopathy: Secondary | ICD-10-CM | POA: Diagnosis not present

## 2021-01-19 DIAGNOSIS — R072 Precordial pain: Secondary | ICD-10-CM | POA: Diagnosis not present

## 2021-01-19 DIAGNOSIS — Z79899 Other long term (current) drug therapy: Secondary | ICD-10-CM | POA: Diagnosis not present

## 2021-01-19 LAB — BASIC METABOLIC PANEL
BUN/Creatinine Ratio: 11 (ref 10–24)
BUN: 14 mg/dL (ref 8–27)
CO2: 26 mmol/L (ref 20–29)
Calcium: 9.9 mg/dL (ref 8.6–10.2)
Chloride: 103 mmol/L (ref 96–106)
Creatinine, Ser: 1.28 mg/dL — ABNORMAL HIGH (ref 0.76–1.27)
Glucose: 122 mg/dL — ABNORMAL HIGH (ref 65–99)
Potassium: 4.5 mmol/L (ref 3.5–5.2)
Sodium: 143 mmol/L (ref 134–144)
eGFR: 60 mL/min/{1.73_m2} (ref >59)

## 2021-01-23 ENCOUNTER — Encounter: Payer: Self-pay | Admitting: *Deleted

## 2021-03-30 DIAGNOSIS — M542 Cervicalgia: Secondary | ICD-10-CM | POA: Diagnosis not present

## 2021-03-30 DIAGNOSIS — M961 Postlaminectomy syndrome, not elsewhere classified: Secondary | ICD-10-CM | POA: Diagnosis not present

## 2021-03-30 DIAGNOSIS — M5416 Radiculopathy, lumbar region: Secondary | ICD-10-CM | POA: Diagnosis not present

## 2021-04-01 ENCOUNTER — Other Ambulatory Visit: Payer: Self-pay | Admitting: Neurosurgery

## 2021-04-01 DIAGNOSIS — M5416 Radiculopathy, lumbar region: Secondary | ICD-10-CM

## 2021-04-13 DIAGNOSIS — H16223 Keratoconjunctivitis sicca, not specified as Sjogren's, bilateral: Secondary | ICD-10-CM | POA: Diagnosis not present

## 2021-04-13 DIAGNOSIS — H40053 Ocular hypertension, bilateral: Secondary | ICD-10-CM | POA: Diagnosis not present

## 2021-04-13 DIAGNOSIS — H10413 Chronic giant papillary conjunctivitis, bilateral: Secondary | ICD-10-CM | POA: Diagnosis not present

## 2021-04-13 DIAGNOSIS — Z794 Long term (current) use of insulin: Secondary | ICD-10-CM | POA: Diagnosis not present

## 2021-04-13 DIAGNOSIS — E119 Type 2 diabetes mellitus without complications: Secondary | ICD-10-CM | POA: Diagnosis not present

## 2021-04-13 DIAGNOSIS — H25813 Combined forms of age-related cataract, bilateral: Secondary | ICD-10-CM | POA: Diagnosis not present

## 2021-04-13 LAB — HM DIABETES EYE EXAM

## 2021-04-14 ENCOUNTER — Ambulatory Visit
Admission: RE | Admit: 2021-04-14 | Discharge: 2021-04-14 | Disposition: A | Discharge status: Home / Self Care | Payer: Medicare Other | Source: Ambulatory Visit | Attending: Neurosurgery | Admitting: Neurosurgery

## 2021-04-14 ENCOUNTER — Other Ambulatory Visit: Payer: Self-pay

## 2021-04-14 DIAGNOSIS — M5416 Radiculopathy, lumbar region: Secondary | ICD-10-CM

## 2021-04-14 DIAGNOSIS — M545 Low back pain, unspecified: Secondary | ICD-10-CM | POA: Diagnosis not present

## 2021-04-14 DIAGNOSIS — M48061 Spinal stenosis, lumbar region without neurogenic claudication: Secondary | ICD-10-CM | POA: Diagnosis not present

## 2021-04-18 ENCOUNTER — Other Ambulatory Visit: Payer: Medicare Other

## 2021-04-27 DIAGNOSIS — M5416 Radiculopathy, lumbar region: Secondary | ICD-10-CM | POA: Diagnosis not present

## 2021-04-27 DIAGNOSIS — M542 Cervicalgia: Secondary | ICD-10-CM | POA: Diagnosis not present

## 2021-04-27 DIAGNOSIS — I10 Essential (primary) hypertension: Secondary | ICD-10-CM | POA: Diagnosis not present

## 2021-04-27 DIAGNOSIS — M961 Postlaminectomy syndrome, not elsewhere classified: Secondary | ICD-10-CM | POA: Diagnosis not present

## 2021-05-12 ENCOUNTER — Ambulatory Visit (INDEPENDENT_AMBULATORY_CARE_PROVIDER_SITE_OTHER): Payer: Medicare Other | Admitting: Internal Medicine

## 2021-05-12 ENCOUNTER — Other Ambulatory Visit: Payer: Self-pay

## 2021-05-12 ENCOUNTER — Encounter: Payer: Self-pay | Admitting: Internal Medicine

## 2021-05-12 VITALS — BP 118/78 | HR 77 | Temp 98.1°F | Ht 73.0 in | Wt 276.8 lb

## 2021-05-12 DIAGNOSIS — M791 Myalgia, unspecified site: Secondary | ICD-10-CM

## 2021-05-12 DIAGNOSIS — N182 Chronic kidney disease, stage 2 (mild): Secondary | ICD-10-CM | POA: Diagnosis not present

## 2021-05-12 DIAGNOSIS — R7303 Prediabetes: Secondary | ICD-10-CM | POA: Diagnosis not present

## 2021-05-12 DIAGNOSIS — Z6836 Body mass index (BMI) 36.0-36.9, adult: Secondary | ICD-10-CM

## 2021-05-12 DIAGNOSIS — I129 Hypertensive chronic kidney disease with stage 1 through stage 4 chronic kidney disease, or unspecified chronic kidney disease: Secondary | ICD-10-CM

## 2021-05-12 MED ORDER — RYBELSUS 3 MG PO TABS: 3.0000 mg | ORAL_TABLET | Freq: Every day | ORAL | 5 refills | Status: DC

## 2021-05-12 MED ORDER — PRAVASTATIN SODIUM 40 MG PO TABS: 40.0000 mg | ORAL_TABLET | Freq: Every day | ORAL | 3 refills | Status: DC

## 2021-05-12 NOTE — Progress Notes (Signed)
I,Tianna Badgett,acting as a Education administrator for [name], MD.,have documented all relevant documentation on the behalf of [name], MD,as directed by  [name], MD while in the presence of [name], MD.  This visit occurred during the SARS-CoV-2 public health emergency.  Safety protocols were in place, including screening questions prior to the visit, additional usage of staff PPE, and extensive cleaning of exam room while observing appropriate contact time as indicated for disinfecting solutions.  Subjective:     Patient ID: Raymond Welch , male    DOB: 06/25/1951 , 70 y.o.   MRN: [id number]   Chief Complaint  Patient presents with   Hypertension    HPI  The patient is here to day for a follow-up on HTN and prediabetes.  He reports compliance with meds. Denies headaches, chest pain and shortness of breath. States he is not exercising as much because his legs feel like they "have a nerve problem". He states he has a tingling sensation in his thighs when he tries to stand up from a seated position. He reports that his sx started about 3 weeks ago. However, he states that he stopped walking weeks prior to that b/c an exacerbation of his chronic back pain.   Hypertension This is a chronic problem. The current episode started more than 1 year ago. The problem has been gradually improving since onset. The problem is controlled. Pertinent negatives include no blurred vision, chest pain, palpitations or shortness of breath. Past treatments include angiotensin blockers and diuretics. The current treatment provides moderate improvement. Compliance problems include exercise.  Hypertensive end-organ damage includes kidney disease. There is no history of hyperaldosteronism, hypercortisolism or sleep apnea.    Past Medical History:  Diagnosis Date   Cardiomyopathy ([place])    Hypertension    Low back pain    Pure hypercholesterolemia    Sleep apnea      Family History  Problem  Relation Age of Onset   Diabetes Mother    Heart attack Mother 43       Died age 53   Aneurysm Father 108   Lung cancer Brother      Current Outpatient Medications:    cyclobenzaprine (FLEXERIL) 10 MG tablet, Take 10 mg by mouth at bedtime as needed for muscle pain., Disp: , Rfl: 2   glucose blood test strip, 1 each by Other route daily as needed for other (blood sugar). Use as instructed, Disp: , Rfl:    losartan-hydrochlorothiazide (HYZAAR) 100-12.5 MG tablet, TAKE 1 TABLET BY MOUTH  DAILY, Disp: 90 tablet, Rfl: 3   metoprolol succinate (TOPROL-XL) 50 MG 24 hr tablet, TAKE 1 TABLET BY MOUTH  DAILY WITH OR IMMEDIATLEY  FOLLOWING A MEAL, Disp: 90 tablet, Rfl: 3   ONETOUCH DELICA LANCETS 35T MISC, 1 each by Does not apply route daily as needed (blood sugar). , Disp: , Rfl:    oxyCODONE-acetaminophen (PERCOCET) 10-325 MG tablet, Take 1 tablet by mouth every 8 (eight) hours as needed for pain. , Disp: , Rfl:    tadalafil (CIALIS) 20 MG tablet, TAKE 1 TABLET BY MOUTH ONCE DAILY AS NEEDED FOR ERECTILE DYSFUNCTION, Disp: 30 tablet, Rfl: 0   benzonatate (TESSALON PERLES) 100 MG capsule, Take 1 capsule (100 mg total) by mouth 3 (three) times daily as needed for cough. (Patient not taking: No sig reported), Disp: 30 capsule, Rfl: 0   Olopatadine HCl 0.7 % SOLN, Place 1 drop into both eyes 2 (two) times daily. (Patient not  taking: Reported on 05/12/2021), Disp: , Rfl:    pravastatin (PRAVACHOL) 40 MG tablet, Take 1 tablet (40 mg total) by mouth daily., Disp: 90 tablet, Rfl: 3   Semaglutide (RYBELSUS) 3 MG TABS, Take 3 mg by mouth daily., Disp: 30 tablet, Rfl: 5   spironolactone (ALDACTONE) 25 MG tablet, Take 1 tablet (25 mg total) by mouth daily., Disp: 90 tablet, Rfl: 3   No Known Allergies   Review of Systems  Constitutional: Negative.   Eyes:  Negative for blurred vision.  Respiratory: Negative.  Negative for shortness of breath.   Cardiovascular: Negative.  Negative for chest pain and  palpitations.  Gastrointestinal: Negative.   Musculoskeletal:  Positive for myalgias.  Neurological: Negative.     Today's Vitals   05/12/21 0906  BP: 118/78  Pulse: 77  Temp: 98.1 F (36.7 C)  Weight: 276 lb 12.8 oz (125.6 kg)  Height: '6\' 1"'  (1.854 m)  PainSc: 8    Body mass index is 36.52 kg/m.  Wt Readings from Last 3 Encounters:  05/12/21 276 lb 12.8 oz (125.6 kg)  01/08/21 273 lb 5.9 oz (124 kg)  01/08/21 273 lb 6.4 oz (124 kg)    Objective:  Physical Exam Vitals and nursing note reviewed.  Constitutional:      Appearance: Normal appearance.  HENT:     Head: Normocephalic and atraumatic.     Nose:     Comments: Masked     Mouth/Throat:     Comments: Masked  Eyes:     Extraocular Movements: Extraocular movements intact.  Cardiovascular:     Rate and Rhythm: Normal rate and regular rhythm.     Heart sounds: Normal heart sounds.  Pulmonary:     Effort: Pulmonary effort is normal.     Breath sounds: Normal breath sounds.  Musculoskeletal:     Cervical back: Normal range of motion.     Comments: Anterior thighs are tender to deep palpation  Skin:    General: Skin is warm.  Neurological:     General: No focal deficit present.     Mental Status: He is alert.  Psychiatric:        Mood and Affect: Mood normal.        Assessment And Plan:     1. Parenchymal renal hypertension, stage 1 through stage 4 or unspecified chronic kidney disease Comments: Chronic, well controlled. Encouraged to folllow low sodium diet. NO change in meds. - CMP14+EGFR  2. Chronic renal disease, stage II Comments: Chronic, encouraged to keep BP well controlled and to stay well hydrated to limit progression of CKD.   3. Pre-diabetes Comments: Chronic, he will c/w Rybelsus 39m daily for now. If not covered by his insurance, pt advised we will need to stop the meds. I will start PA process.  - CMP14+EGFR - Hemoglobin A1c - Insulin, random(561)  4. Myalgia Comments: I will check cpk  and CMP today. I will make further recommendations once his labs are avail for review. He admits to drinking gallon and 1/2 water daily.  - CMP14+EGFR - CK, total  5. Class 2 severe obesity due to excess calories with serious comorbidity and body mass index (BMI) of 36.0 to 36.9 in adult ([facility] Comments: He was advised of 3 lb weight increase. He is encouraged to strive for BMI less than 30 to decrease cardiac risk. Advised to aim for at least 150 minutes of exercise per week.    Patient was given opportunity to ask questions. Patient verbalized  understanding of the plan and was able to repeat key elements of the plan. All questions were answered to their satisfaction.   I, [name], MD, have reviewed all documentation for this visit. The documentation on 05/12/21 for the exam, diagnosis, procedures, and orders are all accurate and complete.   IF YOU HAVE BEEN REFERRED TO A SPECIALIST, IT MAY TAKE 1-2 WEEKS TO SCHEDULE/PROCESS THE REFERRAL. IF YOU HAVE NOT HEARD FROM US/SPECIALIST IN TWO WEEKS, PLEASE GIVE [place] A CALL AT [phone].   THE PATIENT IS ENCOURAGED TO PRACTICE SOCIAL DISTANCING DUE TO THE COVID-19 PANDEMIC.

## 2021-05-12 NOTE — Patient Instructions (Signed)
Preventing Type 2 Diabetes Mellitus °Type 2 diabetes, also called type 2 diabetes mellitus, is a long-term (chronic) disease that affects sugar (glucose) levels in your blood. Normally, a hormone called insulin allows glucose to enter cells in your body. The cells use glucose for energy. With type 2 diabetes, you will have one or both of these problems: °Your pancreas does not make enough insulin. °Cells in your body do not respond properly to insulin that your body makes (insulin resistance). °Insulin resistance or lack of insulin causes extra glucose to build up in the blood instead of going into cells. As a result, high blood glucose (hyperglycemia) develops. That can cause many complications. Being overweight or obese and having an inactive (sedentary) lifestyle can increase your risk for diabetes. Type 2 diabetes can be delayed or prevented by making certain nutrition and lifestyle changes. °How can this condition affect me? °If you do not take steps to prevent diabetes, your blood glucose levels may keep increasing over time. Too much glucose in your blood for a long time can damage your blood vessels, heart, kidneys, nerves, and eyes. °Type 2 diabetes can lead to chronic health problems and complications, such as: °Heart disease. °Stroke. °Blindness. °Kidney disease. °Depression. °Poor circulation in your feet and legs. In severe cases, a foot or leg may need to be surgically removed (amputated). °What can increase my risk? °You may be more likely to develop type 2 diabetes if you: °Have type 2 diabetes in your family. °Are overweight or obese. °Have a sedentary lifestyle. °Have insulin resistance or a history of prediabetes. °Have a history of pregnancy-related (gestational) diabetes or polycystic ovary syndrome (PCOS). °What actions can I take to prevent this? °It can be difficult to recognize signs of type 2 diabetes. Taking action to prevent the disease before you develop symptoms is the best way to avoid  possible damage to your body. Making certain nutrition and lifestyle changes may prevent or delay the disease and related health problems. °Nutrition ° °Eat healthy meals and snacks regularly. Do not skip meals. Fruit or a handful of nuts is a healthy snack between meals. °Drink water throughout the day. Avoid drinks that contain added sugar, such as soda or sweetened tea. Drink enough fluid to keep your urine pale yellow. °Follow instructions from your health care provider about eating or drinking restrictions. °Limit the amount of food you eat by: °Managing how much you eat at a time (portion size). °Checking food labels for the serving sizes of food. °Using a kitchen scale to weigh amounts of food. °Sauté or steam food instead of frying it. Cook with water or broth instead of oils or butter. °Limit saturated fat and salt (sodium) in your diet. Have no more than 1 tsp (2,400 mg) of sodium a day. If you have heart disease or high blood pressure, use less than ½?¾ tsp (1,500 mg) of sodium a day. °Lifestyle ° °Lose weight if needed and as told. Your health care provider can determine how much weight loss is best for you and can help you lose weight safely. °If you are overweight or obese, you may be told to lose at least 5?7% of your body weight. °Manage blood pressure, cholesterol, and stress. Your health care provider will help determine the best treatment for you. °Do not use any products that contain nicotine or tobacco. These products include cigarettes, chewing tobacco, and vaping devices, such as e-cigarettes. If you need help quitting, ask your health care provider. °Activity ° °Do physical   activity that makes your heart beat faster and makes you sweat (moderate intensity). Do this for at least 30 minutes on at least 5 days of the week, or as much as told by your health care provider. °Ask your health care provider what activities are safe for you. A mix of activities may be best, such as walking, swimming,  cycling, and strength training. °Try to add physical activity into your day. For example: °Park your car farther away than usual so that you walk more. °Take a walk during your lunch break. °Use stairs instead of elevators or escalators. °Walk or bike to work instead of driving. °Alcohol use °If you drink alcohol: °Limit how much you have to: °0?1 drink a day for women who are not pregnant. °0?2 drinks a day for men. °Know how much alcohol is in your drink. In the [place] one drink equals one 12 oz bottle of beer (355 mL), one 5 oz glass of wine (148 mL), or one 1½ oz glass of hard liquor (44 mL). °General information °Talk with your health care provider about your risk factors and how you can reduce your risk for diabetes. °Have your blood glucose tested regularly, as told by your health care provider. °Get screening tests as told by your health care provider. You may have these regularly, especially if you have certain risk factors for type 2 diabetes. °Make an appointment with a registered dietitian. This diet and nutrition specialist can help you make a healthy eating plan and help you understand portion sizes and food labels. °Where to find support °Ask your health care provider to recommend a registered dietitian, a certified diabetes care and education specialist, or a weight loss program. °Look for local or online weight loss groups. °Join a gym, fitness club, or outdoor activity group, such as a walking club. °Where to find more information °For help and guidance and to learn more about diabetes and diabetes prevention, visit: °American Diabetes Association (ADA): www.diabetes.org °National Institute of Diabetes and Digestive and Kidney Diseases: www.niddk.nih.gov °To learn more about healthy eating, visit: °[place] Department of Agriculture (USDA): www.choosemyplate.gov °Office of Disease Prevention and Health Promotion (ODPHP): health.gov °Summary °You can delay or prevent type 2 diabetes by eating healthy  foods, losing weight if needed, and increasing your physical activity. °Talk with your health care provider about your risk factors for type 2 diabetes and how you can reduce your risk. °It can be difficult to recognize the signs of type 2 diabetes. The best way to avoid possible damage to your body is to take action to prevent the disease before you develop symptoms. °Get screening tests as told by your health care provider. °This information is not intended to replace advice given to you by your health care provider. Make sure you discuss any questions you have with your health care provider. °Document Revised: 10/13/2020 Document Reviewed: 10/13/2020 °Elsevier Patient Education © 2022 [employer] ° °

## 2021-05-13 LAB — CMP14+EGFR
ALT: 15 IU/L (ref 0–44)
AST: 23 IU/L (ref 0–40)
Albumin/Globulin Ratio: 1.3 (ref 1.2–2.2)
Albumin: 4.2 g/dL (ref 3.7–4.7)
Alkaline Phosphatase: 58 IU/L (ref 44–121)
BUN/Creatinine Ratio: 11 (ref 10–24)
BUN: 13 mg/dL (ref 8–27)
Bilirubin Total: 0.4 mg/dL (ref 0.0–1.2)
CO2: 27 mmol/L (ref 20–29)
Calcium: 9.9 mg/dL (ref 8.6–10.2)
Chloride: 100 mmol/L (ref 96–106)
Creatinine, Ser: 1.23 mg/dL (ref 0.76–1.27)
Globulin, Total: 3.2 g/dL (ref 1.5–4.5)
Glucose: 104 mg/dL — ABNORMAL HIGH (ref 70–99)
Potassium: 4.3 mmol/L (ref 3.5–5.2)
Sodium: 142 mmol/L (ref 134–144)
Total Protein: 7.4 g/dL (ref 6.0–8.5)
eGFR: 63 mL/min/{1.73_m2} (ref >59)

## 2021-05-13 LAB — INSULIN, RANDOM: INSULIN: 29.3 u[IU]/mL — ABNORMAL HIGH (ref 2.6–24.9)

## 2021-05-13 LAB — CK: Total CK: 460 U/L — ABNORMAL HIGH (ref 41–331)

## 2021-05-13 LAB — HEMOGLOBIN A1C
Est. average glucose Bld gHb Est-mCnc: 117 mg/dL
Hgb A1c MFr Bld: 5.7 % — ABNORMAL HIGH (ref 4.8–5.6)

## 2021-06-23 ENCOUNTER — Other Ambulatory Visit: Payer: Self-pay | Admitting: Internal Medicine

## 2021-06-29 ENCOUNTER — Encounter: Payer: Self-pay | Admitting: Internal Medicine

## 2021-06-29 ENCOUNTER — Ambulatory Visit (INDEPENDENT_AMBULATORY_CARE_PROVIDER_SITE_OTHER): Payer: Medicare Other | Admitting: Internal Medicine

## 2021-06-29 ENCOUNTER — Other Ambulatory Visit: Payer: Self-pay

## 2021-06-29 VITALS — BP 132/70 | HR 80 | Temp 98.8°F | Ht 73.0 in | Wt 268.2 lb

## 2021-06-29 DIAGNOSIS — I129 Hypertensive chronic kidney disease with stage 1 through stage 4 chronic kidney disease, or unspecified chronic kidney disease: Secondary | ICD-10-CM

## 2021-06-29 DIAGNOSIS — N182 Chronic kidney disease, stage 2 (mild): Secondary | ICD-10-CM | POA: Diagnosis not present

## 2021-06-29 DIAGNOSIS — R1032 Left lower quadrant pain: Secondary | ICD-10-CM

## 2021-06-29 DIAGNOSIS — M48062 Spinal stenosis, lumbar region with neurogenic claudication: Secondary | ICD-10-CM

## 2021-06-29 DIAGNOSIS — Z6835 Body mass index (BMI) 35.0-35.9, adult: Secondary | ICD-10-CM

## 2021-06-29 LAB — POCT URINALYSIS DIPSTICK
Bilirubin, UA: NEGATIVE
Blood, UA: NEGATIVE
Glucose, UA: NEGATIVE
Ketones, UA: NEGATIVE
Leukocytes, UA: NEGATIVE
Nitrite, UA: NEGATIVE
Protein, UA: NEGATIVE
Spec Grav, UA: 1.02 (ref 1.010–1.025)
Urobilinogen, UA: 1 E.U./dL
pH, UA: 6.5 (ref 5.0–8.0)

## 2021-06-29 MED ORDER — GABAPENTIN 100 MG PO CAPS: ORAL_CAPSULE | ORAL | 2 refills | Status: DC

## 2021-06-29 MED ORDER — TRIAMCINOLONE ACETONIDE 40 MG/ML IJ SUSP
40.0000 mg | Freq: Once | INTRAMUSCULAR | Status: AC
  Administered 2021-06-29: 15:00:00 40 mg via INTRAMUSCULAR

## 2021-06-29 NOTE — Progress Notes (Signed)
[name],acting as a Education administrator for [name], MD.,have documented all relevant documentation on the behalf of [name], MD,as directed by  [name], MD while in the presence of [name], MD.  This visit occurred during the SARS-CoV-2 public health emergency.  Safety protocols were in place, including screening questions prior to the visit, additional usage of staff PPE, and extensive cleaning of exam room while observing appropriate contact time as indicated for disinfecting solutions.  Subjective:     Patient ID: Raymond Welch , male    DOB: [birth year] , 70 y.o.   MRN: [id number]   Chief Complaint  Patient presents with   Leg Pain    HPI  Leg Pain  There was no injury mechanism. The pain is present in the left leg. The pain is moderate. The pain has been Worsening since onset. Associated symptoms include an inability to bear weight. He reports no foreign bodies present. The symptoms are aggravated by movement and weight bearing. He has tried NSAIDs, rest, ice, heat and elevation for the symptoms. The treatment provided no relief.    Past Medical History:  Diagnosis Date   Cardiomyopathy ([place])    Hypertension    Low back pain    Pure hypercholesterolemia    Sleep apnea      Family History  Problem Relation Age of Onset   Diabetes Mother    Heart attack Mother 67       Died age 7   Aneurysm Father 21   Lung cancer Brother      Current Outpatient Medications:    gabapentin (NEURONTIN) 100 MG capsule, One capsule po qpm x 7 days, then increase to 2 capsules po qpm, Disp: 30 capsule, Rfl: 2   glucose blood test strip, 1 each by Other route daily as needed for other (blood sugar). Use as instructed, Disp: , Rfl:    losartan-hydrochlorothiazide (HYZAAR) 100-12.5 MG tablet, TAKE 1 TABLET BY MOUTH  DAILY, Disp: 90 tablet, Rfl: 3   metoprolol succinate (TOPROL-XL) 50 MG 24 hr tablet, TAKE 1 TABLET BY MOUTH  DAILY WITH OR IMMEDIATLEY  FOLLOWING A  MEAL, Disp: 90 tablet, Rfl: 3   Olopatadine HCl 0.7 % SOLN, Place 1 drop into both eyes 2 (two) times daily., Disp: , Rfl:    ONETOUCH DELICA LANCETS 99991111 MISC, 1 each by Does not apply route daily as needed (blood sugar). , Disp: , Rfl:    oxyCODONE-acetaminophen (PERCOCET) 10-325 MG tablet, Take 1 tablet by mouth every 8 (eight) hours as needed for pain. , Disp: , Rfl:    Semaglutide (RYBELSUS) 3 MG TABS, Take 3 mg by mouth daily., Disp: 30 tablet, Rfl: 5   spironolactone (ALDACTONE) 25 MG tablet, Take 1 tablet (25 mg total) by mouth daily., Disp: 90 tablet, Rfl: 3   tadalafil (CIALIS) 20 MG tablet, TAKE 1 TABLET BY MOUTH ONCE DAILY AS NEEDED FOR ERECTILE DYSFUNCTION, Disp: 30 tablet, Rfl: 0   benzonatate (TESSALON PERLES) 100 MG capsule, Take 1 capsule (100 mg total) by mouth 3 (three) times daily as needed for cough. (Patient not taking: Reported on 01/08/2021), Disp: 30 capsule, Rfl: 0   cyclobenzaprine (FLEXERIL) 10 MG tablet, Take 10 mg by mouth at bedtime as needed for muscle pain. (Patient not taking: Reported on 06/29/2021), Disp: , Rfl: 2   pravastatin (PRAVACHOL) 40 MG tablet, TAKE 1 TABLET BY MOUTH  DAILY (Patient not taking: Reported on 06/29/2021), Disp: 90 tablet, Rfl: 3  No Known Allergies   Review of Systems  Constitutional: Negative.   Respiratory: Negative.    Cardiovascular: Negative.   Gastrointestinal: Negative.   Musculoskeletal:  Positive for arthralgias.  Neurological: Negative.   Psychiatric/Behavioral: Negative.      Today's Vitals   06/29/21 1410  BP: 132/70  Pulse: 80  Temp: 98.8 F (37.1 C)  Weight: 268 lb 3.2 oz (121.7 kg)  Height: 6\' 1"  (1.854 m)  PainSc: 10-Worst pain ever  PainLoc: Leg   Body mass index is 35.38 kg/m.  Wt Readings from Last 3 Encounters:  06/29/21 268 lb 3.2 oz (121.7 kg)  05/12/21 276 lb 12.8 oz (125.6 kg)  01/08/21 273 lb 5.9 oz (124 kg)     Objective:  Physical Exam Vitals and nursing note reviewed.  Constitutional:       Appearance: Normal appearance.  HENT:     Head: Normocephalic and atraumatic.     Nose:     Comments: Masked     Mouth/Throat:     Comments: Masked  Eyes:     Extraocular Movements: Extraocular movements intact.  Cardiovascular:     Rate and Rhythm: Normal rate and regular rhythm.     Heart sounds: Normal heart sounds.  Pulmonary:     Effort: Pulmonary effort is normal.     Breath sounds: Normal breath sounds.  Musculoskeletal:        General: Tenderness present.     Cervical back: Normal range of motion.     Comments: Pain w/ mvmt l hip  Skin:    General: Skin is warm.  Neurological:     General: No focal deficit present.     Mental Status: He is alert.  Psychiatric:        Mood and Affect: Mood normal.        Assessment And Plan:     1. Left groin pain Comments: He agrees to see Ortho for further eval/radiographic studies. Sx suggestive of OA left hip.   2. Spinal stenosis of lumbar region with neurogenic claudication Comments: Chronic, I will start him on gabapentin, 100mg  nightly. If no improvement, may increase to 2 capsules nightly after one week.  - gabapentin (NEURONTIN) 100 MG capsule; One capsule po qpm x 7 days, then increase to 2 capsules po qpm  Dispense: 30 capsule; Refill: 2 - triamcinolone acetonide (KENALOG-40) injection 40 mg  3. 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 Goal BP <130/80.  Likely exacerbated by hip pain.  Encouraged to follow low sodium diet. - POCT Urinalysis Dipstick (81002)  4. Chronic renal disease, stage II Comments: Chronic, encouraged to stay well hydrated.   5. Class 2 severe obesity due to excess calories with serious comorbidity and body mass index (BMI) of 35.0 to 35.9 in adult [place]) Comments: He is encouraged to strive for BMI <30 to decrease cardiac risk.    Patient was given opportunity to ask questions. Patient verbalized understanding  of the plan and was able to repeat key elements of the plan. All questions were answered to their satisfaction.   I, , MD, have reviewed all documentation for this visit. The documentation on 07/25/21 for the exam, diagnosis, procedures, and orders are all accurate and complete.   IF YOU HAVE BEEN REFERRED TO A SPECIALIST, IT MAY TAKE 1-2 WEEKS TO SCHEDULE/PROCESS THE REFERRAL. IF YOU HAVE NOT HEARD FROM US/SPECIALIST IN TWO WEEKS, PLEASE GIVE [name] A CALL AT  [phone] X [phone].   THE PATIENT IS ENCOURAGED TO PRACTICE SOCIAL DISTANCING DUE TO THE COVID-19 PANDEMIC.    

## 2021-06-29 NOTE — Patient Instructions (Signed)
Spinal Stenosis °Spinal stenosis happens when the spinal canal gets smaller. The spinal canal is the space between the bones of your spine (vertebrae). As the canal gets smaller, the nerves that pass that part of the spine are pressed. This causes pain. Spinal stenosis can affect your neck, upper back, or lower back. °What are the causes? °This condition is caused by parts of bone that push into your spinal canal. This problem may start before birth. If it occurs after birth, the cause may be: °Breakdown of bones of your spine. This normally starts around 71 years of age. °Injury to your spine. °Tumors in your spine. °A buildup of calcium in your spine. °What increases the risk? °You are more likely to develop this condition if: °You are older than age 50. °You were born with a problem in your spine, such as a curved spine (scoliosis). °You have arthritis. This is disease of your joints. °What are the signs or symptoms? °Common symptoms of this condition include: °Pain in your neck or back. The pain may be worse when you stand or walk. °Problems with your legs. A leg may lose feeling, tingle, or turn hot or cold. °Pain that goes from your butt down to your lower leg (sciatica). °Falling a lot. °Slapping your foot down when you walk. This weakens muscles. °Severe symptoms of this condition include: °Problems pooping or peeing. °Trouble having sex. °Loss of feeling in your leg. °Being unable to walk. °Sometimes there are no symptoms. °How is this treated? °To treat pain and manage symptoms, you may be asked to: °Practice sitting and standing up straight. This is good posture. °Exercise. °Lose weight, if needed. °Take medicines or get shots. °Support your back with a corset or a brace. °In some cases, you may need to have surgery. °Follow these instructions at home: °Managing pain, stiffness, and swelling ° °Stand and sit up straight. If you have a brace or a corset, wear it as told. °Keep a healthy weight. Talk with  your doctor if you need help losing weight. °If told, put heat on the affected area. Do this as often as told by your doctor. Use the heat source that your doctor recommends, such as a moist heat pack or a heating pad. °Place a towel between your skin and the heat source. °Leave the heat on for 20-30 minutes. °Take off the heat if your skin turns bright red. This is very important if you are unable to feel pain, heat, or cold. You have a greater risk of getting burned. °Activity °Do exercises as told by your doctor. °Do not do activities that cause pain. Ask your doctor what activities are safe for you. °Do not lift anything that is heavier than 10 lb (4.5 kg), or the limit that you are told by your doctor. °Return to your normal activities as told by your doctor. Ask your doctor what activities are safe for you. °General instructions °Take over-the-counter and prescription medicines only as told by your doctor. °Do not use any products that contain nicotine or tobacco, such as cigarettes, e-cigarettes, and chewing tobacco. If you need help quitting, ask your doctor. °Eat a healthy diet. Eat a lot of: °Fruits. °Vegetables. °Whole grains. °Low-fat (lean) protein. °Keep all follow-up visits as told by your doctor. This is important. °Contact a doctor if: °Your symptoms do not get better. °Your symptoms get worse. °You have a fever. °Get help right away if: °You have new pain or very bad pain in your neck or   upper back. °You have very bad pain, and medicine does not help. °You have a very bad headache. °You are dizzy. °You do not see well. °You vomit or feel like you may vomit. °You have these things in your back or legs: °New or worse loss of feeling. °New or worse tingling. °Your arm or leg: °Hurts or swells. °Turns red. °Feels warm. °Summary °Spinal stenosis happens when the space between the bones of the spine gets smaller. The small space puts pressure on the nerves in your spine. °This condition may start before  birth. Breakdown of bones, injuries, or tumors can cause this condition after birth. °Spinal stenosis can cause pain in the neck, back, legs, or butt. A leg may lose feeling, tingle, or turn hot or cold. °Treatment for this condition focuses on lessening your pain and other symptoms. °This information is not intended to replace advice given to you by your health care provider. Make sure you discuss any questions you have with your health care provider. °Document Revised: 05/17/2019 Document Reviewed: 05/17/2019 °Elsevier Patient Education © 2022 Else[employer] Inc. ° °

## 2021-06-30 DIAGNOSIS — M25552 Pain in left hip: Secondary | ICD-10-CM | POA: Diagnosis not present

## 2021-07-02 DIAGNOSIS — G4733 Obstructive sleep apnea (adult) (pediatric): Secondary | ICD-10-CM | POA: Diagnosis not present

## 2021-07-04 DIAGNOSIS — M25552 Pain in left hip: Secondary | ICD-10-CM | POA: Diagnosis not present

## 2021-07-10 DIAGNOSIS — M25552 Pain in left hip: Secondary | ICD-10-CM | POA: Diagnosis not present

## 2021-07-28 DIAGNOSIS — M542 Cervicalgia: Secondary | ICD-10-CM | POA: Diagnosis not present

## 2021-07-28 DIAGNOSIS — M961 Postlaminectomy syndrome, not elsewhere classified: Secondary | ICD-10-CM | POA: Diagnosis not present

## 2021-07-28 DIAGNOSIS — M5416 Radiculopathy, lumbar region: Secondary | ICD-10-CM | POA: Diagnosis not present

## 2021-08-17 ENCOUNTER — Ambulatory Visit: Payer: Medicare Other | Admitting: Internal Medicine

## 2021-08-31 ENCOUNTER — Encounter: Payer: Self-pay | Admitting: Internal Medicine

## 2021-08-31 ENCOUNTER — Other Ambulatory Visit: Payer: Self-pay

## 2021-08-31 ENCOUNTER — Ambulatory Visit (INDEPENDENT_AMBULATORY_CARE_PROVIDER_SITE_OTHER): Payer: Commercial Managed Care - HMO | Admitting: Internal Medicine

## 2021-08-31 VITALS — BP 124/82 | HR 82 | Temp 98.0°F | Ht 73.0 in | Wt 269.4 lb

## 2021-08-31 DIAGNOSIS — N182 Chronic kidney disease, stage 2 (mild): Secondary | ICD-10-CM | POA: Diagnosis not present

## 2021-08-31 DIAGNOSIS — Z2821 Immunization not carried out because of patient refusal: Secondary | ICD-10-CM

## 2021-08-31 DIAGNOSIS — Z6835 Body mass index (BMI) 35.0-35.9, adult: Secondary | ICD-10-CM

## 2021-08-31 DIAGNOSIS — I129 Hypertensive chronic kidney disease with stage 1 through stage 4 chronic kidney disease, or unspecified chronic kidney disease: Secondary | ICD-10-CM | POA: Diagnosis not present

## 2021-08-31 DIAGNOSIS — R7303 Prediabetes: Secondary | ICD-10-CM

## 2021-08-31 DIAGNOSIS — Z23 Encounter for immunization: Secondary | ICD-10-CM | POA: Diagnosis not present

## 2021-08-31 NOTE — Progress Notes (Signed)
Raymond Welch,acting as a Education administrator for Raymond Greenland, MD.,have documented all relevant documentation on the behalf of Raymond Greenland, MD,as directed by  Raymond Greenland, MD while in the presence of Raymond Greenland, MD.  This visit occurred during the SARS-CoV-2 public health emergency.  Safety protocols were in place, including screening questions prior to the visit, additional usage of staff PPE, and extensive cleaning of exam room while observing appropriate contact time as indicated for disinfecting solutions.  Subjective:     Patient ID: Raymond Welch , male    DOB: 12/06/1950 , 71 y.o.   MRN: [id number]   Chief Complaint  Patient presents with   Hypertension   Prediabetes    HPI  The patient is here to day for a follow-up on HTN and prediabetes.  He reports compliance with meds. He denies headaches, chest pain and shortness of breath. He is accompanied by his wife today.   Hypertension This is a chronic problem. The current episode started more than 1 year ago. The problem has been gradually improving since onset. The problem is controlled. Pertinent negatives include no blurred vision, chest pain, palpitations or shortness of breath. Past treatments include angiotensin blockers and diuretics. The current treatment provides moderate improvement. Compliance problems include exercise.  Hypertensive end-organ damage includes kidney disease. There is no history of hyperaldosteronism, hypercortisolism or sleep apnea.    Past Medical History:  Diagnosis Date   Cardiomyopathy ([place])    Hypertension    Low back pain    Pure hypercholesterolemia    Sleep apnea      Family History  Problem Relation Age of Onset   Diabetes Mother    Heart attack Mother 58       Died age 74   Aneurysm Father 71   Lung cancer Brother      Current Outpatient Medications:    gabapentin (NEURONTIN) 100 MG capsule, One capsule po qpm x 7 days, then increase to 2 capsules po qpm, Disp: 30 capsule,  Rfl: 2   glucose blood test strip, 1 each by Other route daily as needed for other (blood sugar). Use as instructed, Disp: , Rfl:    losartan-hydrochlorothiazide (HYZAAR) 100-12.5 MG tablet, TAKE 1 TABLET BY MOUTH  DAILY, Disp: 90 tablet, Rfl: 3   metoprolol succinate (TOPROL-XL) 50 MG 24 hr tablet, TAKE 1 TABLET BY MOUTH  DAILY WITH OR IMMEDIATLEY  FOLLOWING A MEAL, Disp: 90 tablet, Rfl: 3   ONETOUCH DELICA LANCETS 08X MISC, 1 each by Does not apply route daily as needed (blood sugar). , Disp: , Rfl:    oxyCODONE-acetaminophen (PERCOCET) 10-325 MG tablet, Take 1 tablet by mouth every 8 (eight) hours as needed for pain. , Disp: , Rfl:    Semaglutide (RYBELSUS) 3 MG TABS, Take 3 mg by mouth daily., Disp: 30 tablet, Rfl: 5   tadalafil (CIALIS) 20 MG tablet, TAKE 1 TABLET BY MOUTH ONCE DAILY AS NEEDED FOR ERECTILE DYSFUNCTION, Disp: 30 tablet, Rfl: 0   spironolactone (ALDACTONE) 25 MG tablet, TAKE 1 TABLET (25 MG TOTAL) BY MOUTH DAILY., Disp: 90 tablet, Rfl: 3   No Known Allergies   Review of Systems  Constitutional: Negative.   Eyes: Negative.  Negative for blurred vision.  Respiratory: Negative.  Negative for shortness of breath.   Cardiovascular: Negative.  Negative for chest pain and palpitations.  Gastrointestinal: Negative.   Endocrine: Negative for polydipsia, polyphagia and polyuria.  Skin: Negative.   Neurological: Negative.   Psychiatric/Behavioral: Negative.  Today's Vitals   08/31/21 1107  BP: 124/82  Pulse: 82  Temp: 98 F (36.7 C)  Weight: 269 lb 6.4 oz (122.2 kg)  Height: _0  (1.854 m)   Body mass index is 35.54 kg/m.  Wt Readings from Last 3 Encounters:  08/31/21 269 lb 6.4 oz (122.2 kg)  06/29/21 268 lb 3.2 oz (121.7 kg)  05/12/21 276 lb 12.8 oz (125.6 kg)     Objective:  Physical Exam Vitals and nursing note reviewed.  Constitutional:      Appearance: Normal appearance. He is obese.  HENT:     Head: Normocephalic and atraumatic.     Nose:      Comments: Masked     Mouth/Throat:     Comments: Masked  Eyes:     Extraocular Movements: Extraocular movements intact.  Cardiovascular:     Rate and Rhythm: Normal rate and regular rhythm.     Heart sounds: Normal heart sounds.  Pulmonary:     Effort: Pulmonary effort is normal.     Breath sounds: Normal breath sounds.  Musculoskeletal:     Cervical back: Normal range of motion.  Skin:    General: Skin is warm.  Neurological:     General: No focal deficit present.     Mental Status: He is alert.  Psychiatric:        Mood and Affect: Mood normal.        Assessment And Plan:     1. Parenchymal renal hypertension, stage 1 through stage 4 or unspecified chronic kidney disease Comments: Chronic, well controlled. No med changes. He is encouraged to follow low sodium diet.   2. Pre-diabetes Comments: He is encouraged to limit intake of refined carbs and sugary drinks. Advised to incorporate more exercise into his daily routine.  - BMP8+EGFR - Hemoglobin A1c  3. Class 2 severe obesity due to excess calories with serious comorbidity and body mass index (BMI) of 35.0 to 35.9 in adult [facility]) Comments: He is encouraged to strive to lose 10-15 pounds to decrease cardiac risk. Advised to aim for at least 150 minutes of exercise per week.   4. Immunization due Comments: He was given Prevnar-20 IM x 1 . - Pneumococcal conjugate vaccine 20-valent (Prevnar 20)   Patient was given opportunity to ask questions. Patient verbalized understanding of the plan and was able to repeat key elements of the plan. All questions were answered to their satisfaction.   I, Raymond Greenland, MD, have reviewed all documentation for this visit. The documentation on 08/31/21 for the exam, diagnosis, procedures, and orders are all accurate and complete.   IF YOU HAVE BEEN REFERRED TO A SPECIALIST, IT MAY TAKE 1-2 WEEKS TO SCHEDULE/PROCESS THE REFERRAL. IF YOU HAVE NOT HEARD FROM US/SPECIALIST IN TWO WEEKS, PLEASE  GIVE [place] A CALL AT [phone].   THE PATIENT IS ENCOURAGED TO PRACTICE SOCIAL DISTANCING DUE TO THE COVID-19 PANDEMIC.

## 2021-09-01 LAB — BMP8+EGFR
BUN/Creatinine Ratio: 15 (ref 10–24)
BUN: 18 mg/dL (ref 8–27)
CO2: 27 mmol/L (ref 20–29)
Calcium: 9.6 mg/dL (ref 8.6–10.2)
Chloride: 102 mmol/L (ref 96–106)
Creatinine, Ser: 1.22 mg/dL (ref 0.76–1.27)
Glucose: 83 mg/dL (ref 70–99)
Potassium: 4.1 mmol/L (ref 3.5–5.2)
Sodium: 141 mmol/L (ref 134–144)
eGFR: 63 mL/min/{1.73_m2} (ref >59)

## 2021-09-01 LAB — HEMOGLOBIN A1C
Est. average glucose Bld gHb Est-mCnc: 117 mg/dL
Hgb A1c MFr Bld: 5.7 % — ABNORMAL HIGH (ref 4.8–5.6)

## 2021-09-02 ENCOUNTER — Other Ambulatory Visit: Payer: Self-pay | Admitting: Cardiology

## 2021-09-30 ENCOUNTER — Other Ambulatory Visit: Payer: Self-pay | Admitting: Internal Medicine

## 2021-09-30 DIAGNOSIS — G4733 Obstructive sleep apnea (adult) (pediatric): Secondary | ICD-10-CM | POA: Diagnosis not present

## 2021-10-07 DIAGNOSIS — M961 Postlaminectomy syndrome, not elsewhere classified: Secondary | ICD-10-CM | POA: Diagnosis not present

## 2021-10-07 DIAGNOSIS — M25552 Pain in left hip: Secondary | ICD-10-CM | POA: Diagnosis not present

## 2021-10-07 DIAGNOSIS — M5416 Radiculopathy, lumbar region: Secondary | ICD-10-CM | POA: Diagnosis not present

## 2021-10-20 ENCOUNTER — Other Ambulatory Visit: Payer: Self-pay

## 2021-10-20 MED ORDER — METOPROLOL SUCCINATE ER 50 MG PO TB24: ORAL_TABLET | ORAL | 3 refills | Status: DC

## 2021-10-28 DIAGNOSIS — M1612 Unilateral primary osteoarthritis, left hip: Secondary | ICD-10-CM | POA: Diagnosis not present

## 2021-11-04 DIAGNOSIS — M25552 Pain in left hip: Secondary | ICD-10-CM | POA: Diagnosis not present

## 2021-12-01 ENCOUNTER — Encounter: Payer: Self-pay | Admitting: Internal Medicine

## 2021-12-01 ENCOUNTER — Ambulatory Visit (INDEPENDENT_AMBULATORY_CARE_PROVIDER_SITE_OTHER): Payer: Medicare Other | Admitting: Internal Medicine

## 2021-12-01 VITALS — BP 120/74 | HR 80 | Temp 98.3°F | Ht 73.0 in | Wt 263.4 lb

## 2021-12-01 DIAGNOSIS — Z2821 Immunization not carried out because of patient refusal: Secondary | ICD-10-CM | POA: Diagnosis not present

## 2021-12-01 DIAGNOSIS — N182 Chronic kidney disease, stage 2 (mild): Secondary | ICD-10-CM

## 2021-12-01 DIAGNOSIS — I420 Dilated cardiomyopathy: Secondary | ICD-10-CM

## 2021-12-01 DIAGNOSIS — R7303 Prediabetes: Secondary | ICD-10-CM

## 2021-12-01 DIAGNOSIS — I129 Hypertensive chronic kidney disease with stage 1 through stage 4 chronic kidney disease, or unspecified chronic kidney disease: Secondary | ICD-10-CM | POA: Diagnosis not present

## 2021-12-01 DIAGNOSIS — Z6834 Body mass index (BMI) 34.0-34.9, adult: Secondary | ICD-10-CM

## 2021-12-01 DIAGNOSIS — E6609 Other obesity due to excess calories: Secondary | ICD-10-CM

## 2021-12-01 NOTE — Progress Notes (Addendum)
?[name],acting as a Education administrator for [name], MD.,have documented all relevant documentation on the behalf of [name], MD,as directed by  [name], MD while in the presence of [name], MD.  ?This visit occurred during the SARS-CoV-2 public health emergency.  Safety protocols were in place, including screening questions prior to the visit, additional usage of staff PPE, and extensive cleaning of exam room while observing appropriate contact time as indicated for disinfecting solutions. ? ?Subjective:  ?  ? Patient ID: Raymond Welch , male    DOB: 02-01-1951 , 71 y.o.   MRN: [id number] ? ? ?Chief Complaint  ?Patient presents with  ? Hypertension  ? Prediabetes  ? ? ?HPI ? ?The patient is here to day for a follow-up on HTN and prediabetes.  He reports compliance with meds. He denies headaches, chest pain and shortness of breath. He admits he is still not exercising as much as he should.  ? ?Hypertension ?This is a chronic problem. The current episode started more than 1 year ago. The problem has been gradually improving since onset. The problem is controlled. Pertinent negatives include no palpitations or shortness of breath. Past treatments include angiotensin blockers and diuretics. The current treatment provides moderate improvement. Compliance problems include exercise.  Hypertensive end-organ damage includes kidney disease. There is no history of hyperaldosteronism, hypercortisolism or sleep apnea.   ? ?Past Medical History:  ?Diagnosis Date  ? Cardiomyopathy ([place])   ? Hypertension   ? Low back pain   ? Pure hypercholesterolemia   ? Sleep apnea   ?  ? ?Family History  ?Problem Relation Age of Onset  ? Diabetes Mother   ? Heart attack Mother 19  ?     Died age 47  ? Aneurysm Father 63  ? Lung cancer Brother   ? ? ? ?Current Outpatient Medications:  ?  gabapentin (NEURONTIN) 100 MG capsule, One capsule po qpm x 7 days, then increase to 2 capsules po qpm, Disp: 30 capsule, Rfl: 2 ?   glucose blood test strip, 1 each by Other route daily as needed for other (blood sugar). Use as instructed, Disp: , Rfl:  ?  losartan-hydrochlorothiazide (HYZAAR) 100-12.5 MG tablet, TAKE 1 TABLET BY MOUTH  DAILY, Disp: 90 tablet, Rfl: 3 ?  metoprolol succinate (TOPROL-XL) 50 MG 24 hr tablet, TAKE 1 TABLET BY MOUTH  DAILY WITH OR IMMEDIATLEY  FOLLOWING A MEAL, Disp: 90 tablet, Rfl: 3 ?  ONETOUCH DELICA LANCETS 42V MISC, 1 each by Does not apply route daily as needed (blood sugar). , Disp: , Rfl:  ?  oxyCODONE-acetaminophen (PERCOCET) 10-325 MG tablet, Take 1 tablet by mouth every 8 (eight) hours as needed for pain. , Disp: , Rfl:  ?  Semaglutide (RYBELSUS) 3 MG TABS, Take 3 mg by mouth daily., Disp: 30 tablet, Rfl: 5 ?  spironolactone (ALDACTONE) 25 MG tablet, TAKE 1 TABLET (25 MG TOTAL) BY MOUTH DAILY., Disp: 90 tablet, Rfl: 3 ?  tadalafil (CIALIS) 20 MG tablet, TAKE 1 TABLET BY MOUTH ONCE DAILY AS NEEDED FOR ERECTILE DYSFUNCTION, Disp: 30 tablet, Rfl: 0  ? ?No Known Allergies  ? ?Review of Systems  ?Constitutional: Negative.   ?Respiratory: Negative.  Negative for shortness of breath.   ?Cardiovascular: Negative.  Negative for palpitations.  ?Gastrointestinal: Negative.   ?Neurological: Negative.   ?Psychiatric/Behavioral: Negative.     ? ?Today's Vitals  ? 12/01/21 1001  ?BP: 120/74  ?Pulse: 80  ?Temp: 98.3 ?F (36.8 ?C)  ?  Weight: 263 lb 6.4 oz (119.5 kg)  ?Height: _0  (1.854 m)  ?PainSc: 0-No pain  ? ?Body mass index is 34.75 kg/m?.  ?Wt Readings from Last 3 Encounters:  ?12/01/21 263 lb 6.4 oz (119.5 kg)  ?08/31/21 269 lb 6.4 oz (122.2 kg)  ?06/29/21 268 lb 3.2 oz (121.7 kg)  ?  ? ?Objective:  ?Physical Exam ?Vitals and nursing note reviewed.  ?Constitutional:   ?   Appearance: Normal appearance.  ?Eyes:  ?   Extraocular Movements: Extraocular movements intact.  ?Cardiovascular:  ?   Rate and Rhythm: Normal rate and regular rhythm.  ?   Heart sounds: Normal heart sounds.  ?Pulmonary:  ?   Effort: Pulmonary  effort is normal.  ?   Breath sounds: Normal breath sounds.  ?Musculoskeletal:  ?   Cervical back: Normal range of motion.  ?Skin: ?   General: Skin is warm.  ?Neurological:  ?   General: No focal deficit present.  ?   Mental Status: He is alert.  ?Psychiatric:     ?   Mood and Affect: Mood normal.  ?   ?Assessment And Plan:  ?   ?1. Parenchymal renal hypertension, stage 1 through stage 4 or unspecified chronic kidney disease ?Comments: Chronic, well controlled. He is encouraged to follow low sodium diet .I will check renal function today.  ?- CMP14+EGFR ?- Lipid panel ? ?2. Chronic renal disease, stage II ?Comments: Chronic, encouraged to stay well hydrated, keep BP controlled and avoid NSAIDS to decrease risk of CKD progression.  ? ?3. Dilated cardiomyopathy ([place]) ?Comments: May 2021 echo revealed slightly decreased EF, along with Grade 1 diastolic dysfunction. He will c/w spironolactone and metoprolol. ? ?4. Pre-diabetes ?Comments: He is currently on oral semaglutide, will increase dose to 70m. He is reminded to stop eating when full. I will check an a1c today.  ?- Hemoglobin A1c ? ?5. Class 1 obesity due to excess calories with serious comorbidity and body mass index (BMI) of 34.0 to 34.9 in adult ?Comments: He is encouraged to strive for BMI<30 to decrease cardiac risk by aiming for at least 150 minutes of exercise per week.  ? ?6. Herpes zoster vaccination declined ?  ?Patient was given opportunity to ask questions. Patient verbalized understanding of the plan and was able to repeat key elements of the plan. All questions were answered to their satisfaction.  ? ?I, RMaximino Greenland MD, have reviewed all documentation for this visit. The documentation on 12/01/21 for the exam, diagnosis, procedures, and orders are all accurate and complete.  ? ?IF YOU HAVE BEEN REFERRED TO A SPECIALIST, IT MAY TAKE 1-2 WEEKS TO SCHEDULE/PROCESS THE REFERRAL. IF YOU HAVE NOT HEARD FROM US/SPECIALIST IN TWO WEEKS, PLEASE GIVE [place] A CALL AT [phone].  ? ?THE PATIENT IS ENCOURAGED TO PRACTICE SOCIAL DISTANCING DUE TO THE COVID-19 PANDEMIC.   ?

## 2021-12-01 NOTE — Patient Instructions (Signed)
Hypertension, Adult ?Hypertension is another name for high blood pressure. High blood pressure forces your heart to work harder to pump blood. This can cause problems over time. ?There are two numbers in a blood pressure reading. There is a top number (systolic) over a bottom number (diastolic). It is best to have a blood pressure that is below 120/80. ?What are the causes? ?The cause of this condition is not known. Some other conditions can lead to high blood pressure. ?What increases the risk? ?Some lifestyle factors can make you more likely to develop high blood pressure: ?Smoking. ?Not getting enough exercise or physical activity. ?Being overweight. ?Having too much fat, sugar, calories, or salt (sodium) in your diet. ?Drinking too much alcohol. ?Other risk factors include: ?Having any of these conditions: ?Heart disease. ?Diabetes. ?High cholesterol. ?Kidney disease. ?Obstructive sleep apnea. ?Having a family history of high blood pressure and high cholesterol. ?Age. The risk increases with age. ?Stress. ?What are the signs or symptoms? ?High blood pressure may not cause symptoms. Very high blood pressure (hypertensive crisis) may cause: ?Headache. ?Fast or uneven heartbeats (palpitations). ?Shortness of breath. ?Nosebleed. ?Vomiting or feeling like you may vomit (nauseous). ?Changes in how you see. ?Very bad chest pain. ?Feeling dizzy. ?Seizures. ?How is this treated? ?This condition is treated by making healthy lifestyle changes, such as: ?Eating healthy foods. ?Exercising more. ?Drinking less alcohol. ?Your doctor may prescribe medicine if lifestyle changes do not help enough and if: ?Your top number is above 130. ?Your bottom number is above 80. ?Your personal target blood pressure may vary. ?Follow these instructions at home: ?Eating and drinking ? ?If told, follow the DASH eating plan. To follow this plan: ?Fill one half of your plate at each meal with fruits and vegetables. ?Fill one fourth of your plate  at each meal with whole grains. Whole grains include whole-wheat pasta, brown rice, and whole-grain bread. ?Eat or drink low-fat dairy products, such as skim milk or low-fat yogurt. ?Fill one fourth of your plate at each meal with low-fat (lean) proteins. Low-fat proteins include fish, chicken without skin, eggs, beans, and tofu. ?Avoid fatty meat, cured and processed meat, or chicken with skin. ?Avoid pre-made or processed food. ?Limit the amount of salt in your diet to less than 1,500 mg each day. ?Do not drink alcohol if: ?Your doctor tells you not to drink. ?You are pregnant, may be pregnant, or are planning to become pregnant. ?If you drink alcohol: ?Limit how much you have to: ?0-1 drink a day for women. ?0-2 drinks a day for men. ?Know how much alcohol is in your drink. In the [place] one drink equals one 12 oz bottle of beer (355 mL), one 5 oz glass of wine (148 mL), or one 1? oz glass of hard liquor (44 mL). ?Lifestyle ? ?Work with your doctor to stay at a healthy weight or to lose weight. Ask your doctor what the best weight is for you. ?Get at least 30 minutes of exercise that causes your heart to beat faster (aerobic exercise) most days of the week. This may include walking, swimming, or biking. ?Get at least 30 minutes of exercise that strengthens your muscles (resistance exercise) at least 3 days a week. This may include lifting weights or doing Pilates. ?Do not smoke or use any products that contain nicotine or tobacco. If you need help quitting, ask your doctor. ?Check your blood pressure at home as told by your doctor. ?Keep all follow-up visits. ?Medicines ?Take over-the-counter and prescription medicines   only as told by your doctor. Follow directions carefully. ?Do not skip doses of blood pressure medicine. The medicine does not work as well if you skip doses. Skipping doses also puts you at risk for problems. ?Ask your doctor about side effects or reactions to medicines that you should watch  for. ?Contact a doctor if: ?You think you are having a reaction to the medicine you are taking. ?You have headaches that keep coming back. ?You feel dizzy. ?You have swelling in your ankles. ?You have trouble with your vision. ?Get help right away if: ?You get a very bad headache. ?You start to feel mixed up (confused). ?You feel weak or numb. ?You feel faint. ?You have very bad pain in your: ?Chest. ?Belly (abdomen). ?You vomit more than once. ?You have trouble breathing. ?These symptoms may be an emergency. Get help right away. Call 911. ?Do not wait to see if the symptoms will go away. ?Do not drive yourself to the hospital. ?Summary ?Hypertension is another name for high blood pressure. ?High blood pressure forces your heart to work harder to pump blood. ?For most people, a normal blood pressure is less than 120/80. ?Making healthy choices can help lower blood pressure. If your blood pressure does not get lower with healthy choices, you may need to take medicine. ?This information is not intended to replace advice given to you by your health care provider. Make sure you discuss any questions you have with your health care provider. ?Document Revised: 05/07/2021 Document Reviewed: 05/07/2021 ?Elsevier Patient Education ? 2023 [employer] ? ?

## 2021-12-02 LAB — CMP14+EGFR
ALT: 22 IU/L (ref 0–44)
AST: 21 IU/L (ref 0–40)
Albumin/Globulin Ratio: 1.5 (ref 1.2–2.2)
Albumin: 4.2 g/dL (ref 3.7–4.7)
Alkaline Phosphatase: 47 IU/L (ref 44–121)
BUN/Creatinine Ratio: 11 (ref 10–24)
BUN: 14 mg/dL (ref 8–27)
Bilirubin Total: 0.3 mg/dL (ref 0.0–1.2)
CO2: 26 mmol/L (ref 20–29)
Calcium: 10 mg/dL (ref 8.6–10.2)
Chloride: 102 mmol/L (ref 96–106)
Creatinine, Ser: 1.3 mg/dL — ABNORMAL HIGH (ref 0.76–1.27)
Globulin, Total: 2.8 g/dL (ref 1.5–4.5)
Glucose: 87 mg/dL (ref 70–99)
Potassium: 4.5 mmol/L (ref 3.5–5.2)
Sodium: 141 mmol/L (ref 134–144)
Total Protein: 7 g/dL (ref 6.0–8.5)
eGFR: 59 mL/min/{1.73_m2} — ABNORMAL LOW (ref >59)

## 2021-12-02 LAB — LIPID PANEL
Chol/HDL Ratio: 4.7 ratio (ref 0.0–5.0)
Cholesterol, Total: 234 mg/dL — ABNORMAL HIGH (ref 100–199)
HDL: 50 mg/dL (ref >39)
LDL Chol Calc (NIH): 159 mg/dL — ABNORMAL HIGH (ref 0–99)
Triglycerides: 141 mg/dL (ref 0–149)
VLDL Cholesterol Cal: 25 mg/dL (ref 5–40)

## 2021-12-02 LAB — HEMOGLOBIN A1C
Est. average glucose Bld gHb Est-mCnc: 111 mg/dL
Hgb A1c MFr Bld: 5.5 % (ref 4.8–5.6)

## 2021-12-16 DIAGNOSIS — M1612 Unilateral primary osteoarthritis, left hip: Secondary | ICD-10-CM | POA: Diagnosis not present

## 2021-12-18 ENCOUNTER — Encounter: Payer: Self-pay | Admitting: Internal Medicine

## 2021-12-19 NOTE — Progress Notes (Signed)
Cardiology Office Note   Date:  12/21/2021   ID:  Raymond Welch, DOB May 13, 1951, MRN [id number]  PCP:  Dorothyann Peng, MD  Cardiologist:   Rollene Rotunda, MD   Chief Complaint  Patient presents with   Cardiomyopathy      History of Present Illness: Raymond Welch is a 71 y.o. male who was referred by Dorothyann Peng, MD for evaluation of chest pain. We saw him in May 2018 for chest pain, he had a negative POET.   However he presented again in 2019 with continued pain and DOE.    Cath demonstrated mild diffuse CAD.  However, he does have a mildly reduced EF of 45%.  At the last visit I stopped his amlodipine and started spironolactone for his mildly reduced ejection fraction.  Since I last saw him he has had no new cardiovascular problems.  He is getting ready to have left total hip replacement.  The patient denies any new symptoms such as chest discomfort, neck or arm discomfort. There has been no new shortness of breath, PND or orthopnea. There have been no reported palpitations, presyncope or syncope.   He is not clear that he is always taking his medicines.  His wife came with him today.  He thinks he is taking most of the most of the days.  He does do some walking for exercise.   Past Medical History:  Diagnosis Date   Cardiomyopathy (HCC)    Hypertension    Low back pain    Pure hypercholesterolemia    Sleep apnea     Past Surgical History:  Procedure Laterality Date   BACK SURGERY     LEFT HEART CATH AND CORONARY ANGIOGRAPHY N/A 12/13/2017   Procedure: LEFT HEART CATH AND CORONARY ANGIOGRAPHY;  Surgeon: Tonny Bollman, MD;  Location: [facility] INVASIVE CV LAB;  Service: Cardiovascular;  Laterality: N/A;     Current Outpatient Medications  Medication Sig Dispense Refill   atorvastatin (LIPITOR) 40 MG tablet Take 1 tablet (40 mg total) by mouth daily. 90 tablet 3   gabapentin (NEURONTIN) 100 MG capsule One capsule po qpm x 7 days, then increase to 2 capsules po qpm 30 capsule 2    glucose blood test strip 1 each by Other route daily as needed for other (blood sugar). Use as instructed     losartan-hydrochlorothiazide (HYZAAR) 100-12.5 MG tablet TAKE 1 TABLET BY MOUTH  DAILY 90 tablet 3   metoprolol succinate (TOPROL-XL) 50 MG 24 hr tablet TAKE 1 TABLET BY MOUTH  DAILY WITH OR IMMEDIATLEY  FOLLOWING A MEAL 90 tablet 3   ONETOUCH DELICA LANCETS 33G MISC 1 each by Does not apply route daily as needed (blood sugar).      oxyCODONE-acetaminophen (PERCOCET) 10-325 MG tablet Take 1 tablet by mouth every 8 (eight) hours as needed for pain.      RELISTOR 150 MG TABS Take 3 tablets by mouth every morning.     Semaglutide (RYBELSUS) 3 MG TABS Take 3 mg by mouth daily. 30 tablet 5   spironolactone (ALDACTONE) 25 MG tablet Take 25 mg by mouth daily.     tadalafil (CIALIS) 20 MG tablet TAKE 1 TABLET BY MOUTH ONCE DAILY AS NEEDED FOR ERECTILE DYSFUNCTION 30 tablet 0   No current facility-administered medications for this visit.    Allergies:   Patient has no known allergies.    ROS:  Please see the history of present illness.   Otherwise, review of systems are positive for none.  All other systems are reviewed and negative.    PHYSICAL EXAM: VS:  BP 130/90   Pulse 80   Ht 6\' 1"  (1.854 m)   Wt 257 lb 12.8 oz (116.9 kg)   SpO2 95%   BMI 34.01 kg/m  , BMI Body mass index is 34.01 kg/m. GENERAL:  Well appearing NECK:  No jugular venous distention, waveform within normal limits, carotid upstroke brisk and symmetric, no bruits, no thyromegaly LUNGS:  Clear to auscultation bilaterally CHEST:  Unremarkable HEART:  PMI not displaced or sustained,S1 and S2 within normal limits, no S3, no S4, no clicks, no rubs, no murmurs ABD:  Flat, positive bowel sounds normal in frequency in pitch, no bruits, no rebound, no guarding, no midline pulsatile mass, no hepatomegaly, no splenomegaly EXT:  2 plus pulses throughout, no edema, no cyanosis no clubbing  EKG:  EKG is  ordered today. The  ekg ordered today demonstrates sinus rhythm, rate 80, axis within normal intervals within normal limits, no acute ST-T wave changes.  Ventricular couplets   Recent Labs: 01/08/2021: Hemoglobin 14.0; Platelets 190 12/01/2021: ALT 22; BUN 14; Creatinine, Ser 1.30; Potassium 4.5; Sodium 141    Lipid Panel    Component Value Date/Time   CHOL 234 (H) 12/01/2021 1047   TRIG 141 12/01/2021 1047   HDL 50 12/01/2021 1047   CHOLHDL 4.7 12/01/2021 1047   LDLCALC 159 (H) 12/01/2021 1047      Wt Readings from Last 3 Encounters:  12/21/21 257 lb 12.8 oz (116.9 kg)  12/01/21 263 lb 6.4 oz (119.5 kg)  08/31/21 269 lb 6.4 oz (122.2 kg)      Other studies Reviewed: Additional studies/ records that were reviewed today include: Labs. Review of the above records demonstrates:  Please see elsewhere in the note.     ASSESSMENT AND PLAN:   DM:   His A1c was 5.7.  This continues to trend down.   DYSLIPIDEMIA:    LDL was 159.  I am going to switch him from pravastatin to Lipitor 40 mg daily and he will get a lipid profile in 10 weeks.   MORBID OBESITY:    He continues to lose weight.  We talked about increasing his physical activity.  I am very proud of his weight loss and congratulated him on this.  SLEEP APNEA:    He does not use CPAP.  His wife is actually using his CPAP and I encouraged her to get a reevaluation so she can get her own device and settings as hers is not working.   PREOP: The patient is not going for high risk surgery.  He has no high risk symptoms or findings.  Therefore according to ACC/AHA guidelines the patient is at acceptable risk for the planned procedure without further testing.  CARDIOMYOPATHY:   He is going to remain on the meds as listed.  PVCs: He does have this on his EKG but does not notice this.  He had no symptoms.  No change in therapy.  He is up-to-date with follow-up labs.  Current medicines are reviewed at length with the patient today.  The patient does not  have concerns regarding medicines.  The following changes have been made:   Labs/ tests ordered today include:   Orders Placed This Encounter  Procedures   Lipid panel   Hepatic function panel   EKG 12-Lead     Disposition:   FU with me in one year   Signed, 09/02/21, MD  12/21/2021 9:19  AM    [place]

## 2021-12-21 ENCOUNTER — Ambulatory Visit: Payer: Medicare Other | Admitting: Cardiology

## 2021-12-21 ENCOUNTER — Telehealth: Payer: Self-pay

## 2021-12-21 ENCOUNTER — Encounter: Payer: Self-pay | Admitting: Cardiology

## 2021-12-21 VITALS — BP 130/90 | HR 80 | Ht 73.0 in | Wt 257.8 lb

## 2021-12-21 DIAGNOSIS — R072 Precordial pain: Secondary | ICD-10-CM

## 2021-12-21 DIAGNOSIS — E118 Type 2 diabetes mellitus with unspecified complications: Secondary | ICD-10-CM | POA: Diagnosis not present

## 2021-12-21 DIAGNOSIS — I420 Dilated cardiomyopathy: Secondary | ICD-10-CM

## 2021-12-21 DIAGNOSIS — E785 Hyperlipidemia, unspecified: Secondary | ICD-10-CM | POA: Diagnosis not present

## 2021-12-21 MED ORDER — ATORVASTATIN CALCIUM 40 MG PO TABS: 40.0000 mg | ORAL_TABLET | Freq: Every day | ORAL | 3 refills | Status: DC

## 2021-12-21 NOTE — Telephone Encounter (Signed)
   Primary Cardiologist: Rollene Rotunda, MD  Chart reviewed as part of pre-operative protocol coverage.   He was evaluated in our office by Dr. Antoine Poche on 12/21/21. Given past medical history and time since last visit, based on ACC/AHA guidelines, Raymond Welch would be at acceptable risk for the planned procedure without further cardiovascular testing.   I will route this recommendation to the requesting party via Epic fax function and remove from pre-op pool.  Please call with questions.  Levi Aland, NP-C    12/21/2021, 2:31 PM [facility] [address] Office [phone] Fax [phone]

## 2021-12-21 NOTE — Patient Instructions (Signed)
Medication Instructions:  STOP pravastatin START atorvastatin (Lipitor) 40 mg daily  *If you need a refill on your cardiac medications before your next appointment, please call your pharmacy*   Lab Work: Please return for FASTING labs in 10 weeks (Lipid, Hepatic)  Our in office lab hours are Monday-Friday 8:00-4:00, closed for lunch 12:45-1:45 pm.  No appointment needed.  [facility] locations:   [facility] - [address]  - [address] ([facility]) - [address] [phone] [address]   [place] - [address]    [place]  - [address]    [place] - [address] - [address] [employer]  - [address] [place] - [address] ([facility])   Follow-Up: At [facility], you and your health needs are our priority.  As part of our continuing mission to provide you with exceptional heart care, we have created designated Provider Care Teams.  These Care Teams include your primary Cardiologist (physician) and Advanced Practice Providers (APPs -  Physician Assistants and Nurse Practitioners) who all work together to provide you with the care you need, when you need it.  We recommend signing up for the patient portal called "MyChart".  Sign up information is provided on this After Visit Summary.  MyChart is used to connect with patients for Virtual Visits (Telemedicine).  Patients are able to view lab/test results, encounter notes, upcoming appointments, etc.  Non-urgent messages can be sent to your provider as well.   To learn more about what you can do with MyChart, go to [email or web address].    Your next appointment:   12 month(s)  The format for your next appointment:   In Person  Provider:   Rollene Rotunda, MD {    Important Information About Sugar

## 2021-12-21 NOTE — Telephone Encounter (Signed)
   Pre-operative Risk Assessment    Patient Name: Raymond Welch  DOB: 01-Apr-1951 MRN: [id number]      Request for Surgical Clearance    Procedure:   LEFT TOTAL HIP ARTHROPLASTY   Date of Surgery:  Clearance 02/23/22                                 Surgeon:  DR. MATTHEW OLIN Surgeon's Group or Practice Name:  [facility]  Phone number:  [phone] Fax number:  [phone] ATTN: SHERRY WILLIS    Type of Clearance Requested:   - Medical    Type of Anesthesia:  Spinal   Additional requests/questions:    Signed, Chana Bode   12/21/2021, 1:15 PM

## 2021-12-31 DIAGNOSIS — M5416 Radiculopathy, lumbar region: Secondary | ICD-10-CM | POA: Diagnosis not present

## 2021-12-31 DIAGNOSIS — M25552 Pain in left hip: Secondary | ICD-10-CM | POA: Diagnosis not present

## 2021-12-31 DIAGNOSIS — M961 Postlaminectomy syndrome, not elsewhere classified: Secondary | ICD-10-CM | POA: Diagnosis not present

## 2022-01-22 ENCOUNTER — Other Ambulatory Visit: Payer: Self-pay | Admitting: Internal Medicine

## 2022-02-03 ENCOUNTER — Encounter: Payer: Self-pay | Admitting: Internal Medicine

## 2022-02-03 ENCOUNTER — Ambulatory Visit (INDEPENDENT_AMBULATORY_CARE_PROVIDER_SITE_OTHER): Payer: Medicare Other | Admitting: Internal Medicine

## 2022-02-03 ENCOUNTER — Ambulatory Visit (INDEPENDENT_AMBULATORY_CARE_PROVIDER_SITE_OTHER): Payer: Medicare Other

## 2022-02-03 VITALS — BP 130/80 | HR 70 | Temp 98.3°F | Ht 74.0 in | Wt 263.2 lb

## 2022-02-03 VITALS — BP 130/80 | HR 70 | Temp 98.3°F | Ht 74.0 in | Wt 263.0 lb

## 2022-02-03 DIAGNOSIS — Z0000 Encounter for general adult medical examination without abnormal findings: Secondary | ICD-10-CM

## 2022-02-03 DIAGNOSIS — R7303 Prediabetes: Secondary | ICD-10-CM | POA: Diagnosis not present

## 2022-02-03 DIAGNOSIS — M1612 Unilateral primary osteoarthritis, left hip: Secondary | ICD-10-CM

## 2022-02-03 DIAGNOSIS — N182 Chronic kidney disease, stage 2 (mild): Secondary | ICD-10-CM

## 2022-02-03 DIAGNOSIS — I129 Hypertensive chronic kidney disease with stage 1 through stage 4 chronic kidney disease, or unspecified chronic kidney disease: Secondary | ICD-10-CM | POA: Diagnosis not present

## 2022-02-03 DIAGNOSIS — Z6833 Body mass index (BMI) 33.0-33.9, adult: Secondary | ICD-10-CM

## 2022-02-03 DIAGNOSIS — E6609 Other obesity due to excess calories: Secondary | ICD-10-CM

## 2022-02-03 NOTE — Patient Instructions (Signed)
Hypertension, Adult ?Hypertension is another name for high blood pressure. High blood pressure forces your heart to work harder to pump blood. This can cause problems over time. ?There are two numbers in a blood pressure reading. There is a top number (systolic) over a bottom number (diastolic). It is best to have a blood pressure that is below 120/80. ?What are the causes? ?The cause of this condition is not known. Some other conditions can lead to high blood pressure. ?What increases the risk? ?Some lifestyle factors can make you more likely to develop high blood pressure: ?Smoking. ?Not getting enough exercise or physical activity. ?Being overweight. ?Having too much fat, sugar, calories, or salt (sodium) in your diet. ?Drinking too much alcohol. ?Other risk factors include: ?Having any of these conditions: ?Heart disease. ?Diabetes. ?High cholesterol. ?Kidney disease. ?Obstructive sleep apnea. ?Having a family history of high blood pressure and high cholesterol. ?Age. The risk increases with age. ?Stress. ?What are the signs or symptoms? ?High blood pressure may not cause symptoms. Very high blood pressure (hypertensive crisis) may cause: ?Headache. ?Fast or uneven heartbeats (palpitations). ?Shortness of breath. ?Nosebleed. ?Vomiting or feeling like you may vomit (nauseous). ?Changes in how you see. ?Very bad chest pain. ?Feeling dizzy. ?Seizures. ?How is this treated? ?This condition is treated by making healthy lifestyle changes, such as: ?Eating healthy foods. ?Exercising more. ?Drinking less alcohol. ?Your doctor may prescribe medicine if lifestyle changes do not help enough and if: ?Your top number is above 130. ?Your bottom number is above 80. ?Your personal target blood pressure may vary. ?Follow these instructions at home: ?Eating and drinking ? ?If told, follow the DASH eating plan. To follow this plan: ?Fill one half of your plate at each meal with fruits and vegetables. ?Fill one fourth of your plate  at each meal with whole grains. Whole grains include whole-wheat pasta, brown rice, and whole-grain bread. ?Eat or drink low-fat dairy products, such as skim milk or low-fat yogurt. ?Fill one fourth of your plate at each meal with low-fat (lean) proteins. Low-fat proteins include fish, chicken without skin, eggs, beans, and tofu. ?Avoid fatty meat, cured and processed meat, or chicken with skin. ?Avoid pre-made or processed food. ?Limit the amount of salt in your diet to less than 1,500 mg each day. ?Do not drink alcohol if: ?Your doctor tells you not to drink. ?You are pregnant, may be pregnant, or are planning to become pregnant. ?If you drink alcohol: ?Limit how much you have to: ?0-1 drink a day for women. ?0-2 drinks a day for men. ?Know how much alcohol is in your drink. In the [place] one drink equals one 12 oz bottle of beer (355 mL), one 5 oz glass of wine (148 mL), or one 1? oz glass of hard liquor (44 mL). ?Lifestyle ? ?Work with your doctor to stay at a healthy weight or to lose weight. Ask your doctor what the best weight is for you. ?Get at least 30 minutes of exercise that causes your heart to beat faster (aerobic exercise) most days of the week. This may include walking, swimming, or biking. ?Get at least 30 minutes of exercise that strengthens your muscles (resistance exercise) at least 3 days a week. This may include lifting weights or doing Pilates. ?Do not smoke or use any products that contain nicotine or tobacco. If you need help quitting, ask your doctor. ?Check your blood pressure at home as told by your doctor. ?Keep all follow-up visits. ?Medicines ?Take over-the-counter and prescription medicines   only as told by your doctor. Follow directions carefully. ?Do not skip doses of blood pressure medicine. The medicine does not work as well if you skip doses. Skipping doses also puts you at risk for problems. ?Ask your doctor about side effects or reactions to medicines that you should watch  for. ?Contact a doctor if: ?You think you are having a reaction to the medicine you are taking. ?You have headaches that keep coming back. ?You feel dizzy. ?You have swelling in your ankles. ?You have trouble with your vision. ?Get help right away if: ?You get a very bad headache. ?You start to feel mixed up (confused). ?You feel weak or numb. ?You feel faint. ?You have very bad pain in your: ?Chest. ?Belly (abdomen). ?You vomit more than once. ?You have trouble breathing. ?These symptoms may be an emergency. Get help right away. Call 911. ?Do not wait to see if the symptoms will go away. ?Do not drive yourself to the hospital. ?Summary ?Hypertension is another name for high blood pressure. ?High blood pressure forces your heart to work harder to pump blood. ?For most people, a normal blood pressure is less than 120/80. ?Making healthy choices can help lower blood pressure. If your blood pressure does not get lower with healthy choices, you may need to take medicine. ?This information is not intended to replace advice given to you by your health care provider. Make sure you discuss any questions you have with your health care provider. ?Document Revised: 05/07/2021 Document Reviewed: 05/07/2021 ?Elsevier Patient Education ? 2023 [employer] ? ?

## 2022-02-03 NOTE — Progress Notes (Signed)
Subjective:   Raymond Welch is a 71 y.o. male who presents for Medicare Annual/Subsequent preventive examination.  Review of Systems     Cardiac Risk Factors include: advanced age (>52men, >68 women);dyslipidemia;hypertension     Objective:    Today's Vitals   02/03/22 0820 02/03/22 0827  BP: 130/80   Pulse: 70   Temp: 98.3 F (36.8 C)   TempSrc: Oral   SpO2: 95%   Weight: 263 lb 3.2 oz (119.4 kg)   Height: 6\' 2"  (1.88 m)   PainSc:  8    Body mass index is 33.79 kg/m.     02/03/2022    8:31 AM 01/08/2021    9:42 AM 11/15/2019    9:19 AM 05/02/2019    9:26 AM 07/19/2018    3:45 PM 06/08/2018   12:28 PM 12/13/2017    7:13 AM  Advanced Directives  Does Patient Have a Medical Advance Directive? No No No No No No No  Would patient like information on creating a medical advance directive? No - Patient declined No - Patient declined No - Patient declined No - Patient declined Yes (MAU/Ambulatory/Procedural Areas - Information given) No - Patient declined No - Patient declined    Current Medications (verified) Outpatient Encounter Medications as of 02/03/2022  Medication Sig   atorvastatin (LIPITOR) 40 MG tablet Take 1 tablet (40 mg total) by mouth daily.   gabapentin (NEURONTIN) 100 MG capsule One capsule po qpm x 7 days, then increase to 2 capsules po qpm   glucose blood test strip 1 each by Other route daily as needed for other (blood sugar). Use as instructed   losartan-hydrochlorothiazide (HYZAAR) 100-12.5 MG tablet TAKE 1 TABLET BY MOUTH  DAILY   metoprolol succinate (TOPROL-XL) 50 MG 24 hr tablet TAKE 1 TABLET BY MOUTH  DAILY WITH OR IMMEDIATLEY  FOLLOWING A MEAL   ONETOUCH DELICA LANCETS 33G MISC 1 each by Does not apply route daily as needed (blood sugar).    oxyCODONE-acetaminophen (PERCOCET) 10-325 MG tablet Take 1 tablet by mouth every 8 (eight) hours as needed for pain.    RYBELSUS 3 MG TABS TAKE 1 TABLET BY MOUTH EVERY DAY   spironolactone (ALDACTONE) 25 MG tablet Take  25 mg by mouth daily.   tadalafil (CIALIS) 20 MG tablet TAKE 1 TABLET BY MOUTH ONCE DAILY AS NEEDED FOR ERECTILE DYSFUNCTION   RELISTOR 150 MG TABS Take 3 tablets by mouth every morning. (Patient not taking: Reported on 02/03/2022)   No facility-administered encounter medications on file as of 02/03/2022.    Allergies (verified) Patient has no known allergies.   History: Past Medical History:  Diagnosis Date   Cardiomyopathy (HCC)    Hypertension    Low back pain    Pure hypercholesterolemia    Sleep apnea    Past Surgical History:  Procedure Laterality Date   BACK SURGERY     LEFT HEART CATH AND CORONARY ANGIOGRAPHY N/A 12/13/2017   Procedure: LEFT HEART CATH AND CORONARY ANGIOGRAPHY;  Surgeon: 12/15/2017, MD;  Location: [facility] INVASIVE CV LAB;  Service: Cardiovascular;  Laterality: N/A;   Family History  Problem Relation Age of Onset   Diabetes Mother    Heart attack Mother 74       Died age 57   Aneurysm Father 82   Lung cancer Brother    Social History   Socioeconomic History   Marital status: Married    Spouse name: Not on file   Number of children: 8  Years of education: Not on file   Highest education level: Not on file  Occupational History   Occupation: Glass blower/designer   Occupation: retired  Tobacco Use   Smoking status: Former    Packs/day: 0.75    Years: 40.00    Total pack years: 30.00    Types: Cigarettes   Smokeless tobacco: Never   Tobacco comments:    Quit 8 years ago.   Vaping Use   Vaping Use: Never used  Substance and Sexual Activity   Alcohol use: No   Drug use: Yes    Types: Oxycodone   Sexual activity: Yes  Other Topics Concern   Not on file  Social History Narrative   Lives at home with wife.     Social Determinants of Health   Financial Resource Strain: Low Risk  (02/03/2022)   Overall Financial Resource Strain (CARDIA)    Difficulty of Paying Living Expenses: Not hard at all  Food Insecurity: No Food Insecurity (02/03/2022)    Hunger Vital Sign    Worried About Running Out of Food in the Last Year: Never true    Ran Out of Food in the Last Year: Never true  Transportation Needs: No Transportation Needs (02/03/2022)   PRAPARE - Hydrologist (Medical): No    Lack of Transportation (Non-Medical): No  Physical Activity: Inactive (02/03/2022)   Exercise Vital Sign    Days of Exercise per Week: 0 days    Minutes of Exercise per Session: 0 min  Stress: No Stress Concern Present (02/03/2022)   [place]    Feeling of Stress : Not at all  Social Connections: Not on file    Tobacco Counseling Counseling given: Not Answered Tobacco comments: Quit 8 years ago.    Clinical Intake:  Pre-visit preparation completed: Yes  Pain : 0-10 Pain Score: 8  Pain Type: Chronic pain Pain Location: Back Pain Orientation: Lower Pain Descriptors / Indicators: Sharp Pain Onset: More than a month ago Pain Frequency: Constant     Nutritional Status: BMI > 30  Obese Nutritional Risks: None Diabetes: No  How often do you need to have someone help you when you read instructions, pamphlets, or other written materials from your doctor or pharmacy?: 1 - Never What is the last grade level you completed in school?: 6th grade  Diabetic? no  Interpreter Needed?: No  Information entered by :: NAllen LPN   Activities of Daily Living    02/03/2022    8:32 AM  In your present state of health, do you have any difficulty performing the following activities:  Hearing? 0  Vision? 0  Difficulty concentrating or making decisions? 0  Walking or climbing stairs? 0  Dressing or bathing? 0  Doing errands, shopping? 0  Preparing Food and eating ? N  Using the Toilet? N  In the past six months, have you accidently leaked urine? N  Do you have problems with loss of bowel control? N  Managing your Medications? N  Managing your Finances? N   Housekeeping or managing your Housekeeping? N    Patient Care Team: Glendale Chard, MD as PCP - General (Internal Medicine) Minus Breeding, MD as PCP - Cardiology (Cardiology) [facility]  Indicate any recent Medical Services you may have received from other than [facility] providers in the past year (date may be approximate).     Assessment:   This is a routine wellness examination  for [place].  Hearing/Vision screen Vision Screening - Comments:: Regular eye exams, [facility]  Dietary issues and exercise activities discussed: Current Exercise Habits: The patient does not participate in regular exercise at present   Goals Addressed             This Visit's Progress    Patient Stated       02/03/2022, no goals       Depression Screen    02/03/2022    8:32 AM 01/08/2021    9:42 AM 11/15/2019    9:20 AM 11/15/2019    8:59 AM 05/02/2019    9:27 AM 01/30/2019    9:27 AM 09/13/2018    9:26 AM  PHQ 2/9 Scores  PHQ - 2 Score 0 0 0 0 0 0 0  PHQ- 9 Score   0  0      Fall Risk    02/03/2022    8:32 AM 01/08/2021    9:42 AM 11/15/2019    9:19 AM 11/15/2019    8:59 AM 05/02/2019    9:27 AM  Fall Risk   Falls in the past year? 0 0 0 0 0  Number falls in past yr: 0    0  Injury with Fall? 0      Risk for fall due to : Medication side effect Medication side effect Medication side effect  Medication side effect  Follow up Falls evaluation completed;Education provided;Falls prevention discussed Falls evaluation completed;Education provided;Falls prevention discussed Falls evaluation completed;Education provided;Falls prevention discussed  Falls evaluation completed;Education provided;Falls prevention discussed    FALL RISK PREVENTION PERTAINING TO THE HOME:  Any stairs in or around the home? Yes  If so, are there any without handrails? No  Home free of loose throw rugs in walkways, pet beds, electrical cords, etc? Yes  Adequate lighting in your home to reduce  risk of falls? Yes   ASSISTIVE DEVICES UTILIZED TO PREVENT FALLS:  Life alert? No  Use of a cane, walker or w/c? No  Grab bars in the bathroom? No  Shower chair or bench in shower? No  Elevated toilet seat or a handicapped toilet? No   TIMED UP AND GO:  Was the test performed? No .  .   Gait slow and steady without use of assistive device  Cognitive Function:        02/03/2022    8:33 AM 01/08/2021    9:43 AM 11/15/2019    9:21 AM 05/02/2019    9:29 AM 07/19/2018    3:48 PM  6CIT Screen  What Year? 0 points 0 points 0 points 0 points 0 points  What month? 0 points 0 points 0 points 0 points 0 points  What time? 0 points 0 points 0 points 0 points 0 points  Count back from 20 0 points 0 points 0 points 0 points 0 points  Months in reverse 4 points 4 points 4 points 4 points 2 points  Repeat phrase 4 points 10 points 2 points 2 points 2 points  Total Score 8 points 14 points 6 points 6 points 4 points    Immunizations Immunization History  Administered Date(s) Administered   PFIZER(Purple Top)SARS-COV-2 Vaccination 12/07/2019, 12/28/2019   PNEUMOCOCCAL CONJUGATE-20 08/31/2021    TDAP status: Up to date  Flu Vaccine status: Declined, Education has been provided regarding the importance of this vaccine but patient still declined. Advised may receive this vaccine at local pharmacy or Health Dept. Aware to provide a copy of the  vaccination record if obtained from local pharmacy or Health Dept. Verbalized acceptance and understanding.  Pneumococcal vaccine status: Up to date  Covid-19 vaccine status: Completed vaccines  Qualifies for Shingles Vaccine? Yes   Zostavax completed No   Shingrix Completed?: No.    Education has been provided regarding the importance of this vaccine. Patient has been advised to call insurance company to determine out of pocket expense if they have not yet received this vaccine. Advised may also receive vaccine at local pharmacy or Health Dept.  Verbalized acceptance and understanding.  Screening Tests Health Maintenance  Topic Date Due   COVID-19 Vaccine (3 - Pfizer risk series) 01/25/2020   FOOT EXAM  07/16/2020   OPHTHALMOLOGY EXAM  02/18/2021   Zoster Vaccines- Shingrix (1 of 2) 03/03/2022 (Originally 02/14/1970)   INFLUENZA VACCINE  03/02/2022   HEMOGLOBIN A1C  06/03/2022   COLONOSCOPY (Pts 45-21yrs Insurance coverage will need to be confirmed)  10/02/2023   TETANUS/TDAP  02/14/2025   Pneumonia Vaccine 91+ Years old  Completed   Hepatitis C Screening  Completed   HPV VACCINES  Aged Out    Health Maintenance  Health Maintenance Due  Topic Date Due   COVID-19 Vaccine (3 - Pfizer risk series) 01/25/2020   FOOT EXAM  07/16/2020   OPHTHALMOLOGY EXAM  02/18/2021    Colorectal cancer screening: Type of screening: Colonoscopy. Completed 10/01/2013. Repeat every 10 years  Lung Cancer Screening: (Low Dose CT Chest recommended if Age 61-80 years, 30 pack-year currently smoking OR have quit w/in 15years.) does not qualify.   Lung Cancer Screening Referral: no  Additional Screening:  Hepatitis C Screening: does qualify; Completed 07/17/2019  Vision Screening: Recommended annual ophthalmology exams for early detection of glaucoma and other disorders of the eye. Is the patient up to date with their annual eye exam?  Yes  Who is the provider or what is the name of the office in which the patient attends annual eye exams? [facility] If pt is not established with a provider, would they like to be referred to a provider to establish care? No .   Dental Screening: Recommended annual dental exams for proper oral hygiene  Community Resource Referral / Chronic Care Management: CRR required this visit?  No   CCM required this visit?  No      Plan:     I have personally reviewed and noted the following in the patient's chart:   Medical and social history Use of alcohol, tobacco or illicit drugs  Current  medications and supplements including opioid prescriptions. Patient is currently taking opioid prescriptions. Information provided to patient regarding non-opioid alternatives. Patient advised to discuss non-opioid treatment plan with their provider. Functional ability and status Nutritional status Physical activity Advanced directives List of other physicians Hospitalizations, surgeries, and ER visits in previous 12 months Vitals Screenings to include cognitive, depression, and falls Referrals and appointments  In addition, I have reviewed and discussed with patient certain preventive protocols, quality metrics, and best practice recommendations. A written personalized care plan for preventive services as well as general preventive health recommendations were provided to patient.     Kellie Simmering, LPN   [id number]   Nurse Notes: none

## 2022-02-03 NOTE — Patient Instructions (Signed)
Raymond Welch , Thank you for taking time to come for your Medicare Wellness Visit. I appreciate your ongoing commitment to your health goals. Please review the following plan we discussed and let me know if I can assist you in the future.   Screening recommendations/referrals: Colonoscopy: completed 10/01/2013, due 10/02/2023 Recommended yearly ophthalmology/optometry visit for glaucoma screening and checkup Recommended yearly dental visit for hygiene and checkup  Vaccinations: Influenza vaccine: decline Pneumococcal vaccine: completed 08/31/2021 Tdap vaccine: completed 02/15/2015, due 02/14/2025 Shingles vaccine: discussed   Covid-19:  12/28/2019, 12/07/2019  Advanced directives: Advance directive discussed with you today. Even though you declined this today please call our office should you change your mind and we can give you the proper paperwork for you to fill out.  Conditions/risks identified: none  Next appointment: Follow up in one year for your annual wellness visit.   Preventive Care 72 Years and Older, Male Preventive care refers to lifestyle choices and visits with your health care provider that can promote health and wellness. What does preventive care include? A yearly physical exam. This is also called an annual well check. Dental exams once or twice a year. Routine eye exams. Ask your health care provider how often you should have your eyes checked. Personal lifestyle choices, including: Daily care of your teeth and gums. Regular physical activity. Eating a healthy diet. Avoiding tobacco and drug use. Limiting alcohol use. Practicing safe sex. Taking low doses of aspirin every day. Taking vitamin and mineral supplements as recommended by your health care provider. What happens during an annual well check? The services and screenings done by your health care provider during your annual well check will depend on your age, overall health, lifestyle risk factors, and family  history of disease. Counseling  Your health care provider may ask you questions about your: Alcohol use. Tobacco use. Drug use. Emotional well-being. Home and relationship well-being. Sexual activity. Eating habits. History of falls. Memory and ability to understand (cognition). Work and work Astronomer. Screening  You may have the following tests or measurements: Height, weight, and BMI. Blood pressure. Lipid and cholesterol levels. These may be checked every 5 years, or more frequently if you are over 64 years old. Skin check. Lung cancer screening. You may have this screening every year starting at age 50 if you have a 30-pack-year history of smoking and currently smoke or have quit within the past 15 years. Fecal occult blood test (FOBT) of the stool. You may have this test every year starting at age 72. Flexible sigmoidoscopy or colonoscopy. You may have a sigmoidoscopy every 5 years or a colonoscopy every 10 years starting at age 72. Prostate cancer screening. Recommendations will vary depending on your family history and other risks. Hepatitis C blood test. Hepatitis B blood test. Sexually transmitted disease (STD) testing. Diabetes screening. This is done by checking your blood sugar (glucose) after you have not eaten for a while (fasting). You may have this done every 1-3 years. Abdominal aortic aneurysm (AAA) screening. You may need this if you are a current or former smoker. Osteoporosis. You may be screened starting at age 40 if you are at high risk. Talk with your health care provider about your test results, treatment options, and if necessary, the need for more tests. Vaccines  Your health care provider may recommend certain vaccines, such as: Influenza vaccine. This is recommended every year. Tetanus, diphtheria, and acellular pertussis (Tdap, Td) vaccine. You may need a Td booster every 10 years. Zoster vaccine.  You may need this after age 50. Pneumococcal  13-valent conjugate (PCV13) vaccine. One dose is recommended after age 55. Pneumococcal polysaccharide (PPSV23) vaccine. One dose is recommended after age 44. Talk to your health care provider about which screenings and vaccines you need and how often you need them. This information is not intended to replace advice given to you by your health care provider. Make sure you discuss any questions you have with your health care provider. Document Released: 08/15/2015 Document Revised: 04/07/2016 Document Reviewed: 05/20/2015 Elsevier Interactive Patient Education  2017 [employer] Prevention in the Home Falls can cause injuries. They can happen to people of all ages. There are many things you can do to make your home safe and to help prevent falls. What can I do on the outside of my home? Regularly fix the edges of walkways and driveways and fix any cracks. Remove anything that might make you trip as you walk through a door, such as a raised step or threshold. Trim any bushes or trees on the path to your home. Use bright outdoor lighting. Clear any walking paths of anything that might make someone trip, such as rocks or tools. Regularly check to see if handrails are loose or broken. Make sure that both sides of any steps have handrails. Any raised decks and porches should have guardrails on the edges. Have any leaves, snow, or ice cleared regularly. Use sand or salt on walking paths during winter. Clean up any spills in your garage right away. This includes oil or grease spills. What can I do in the bathroom? Use night lights. Install grab bars by the toilet and in the tub and shower. Do not use towel bars as grab bars. Use non-skid mats or decals in the tub or shower. If you need to sit down in the shower, use a plastic, non-slip stool. Keep the floor dry. Clean up any water that spills on the floor as soon as it happens. Remove soap buildup in the tub or shower regularly. Attach  bath mats securely with double-sided non-slip rug tape. Do not have throw rugs and other things on the floor that can make you trip. What can I do in the bedroom? Use night lights. Make sure that you have a light by your bed that is easy to reach. Do not use any sheets or blankets that are too big for your bed. They should not hang down onto the floor. Have a firm chair that has side arms. You can use this for support while you get dressed. Do not have throw rugs and other things on the floor that can make you trip. What can I do in the kitchen? Clean up any spills right away. Avoid walking on wet floors. Keep items that you use a lot in easy-to-reach places. If you need to reach something above you, use a strong step stool that has a grab bar. Keep electrical cords out of the way. Do not use floor polish or wax that makes floors slippery. If you must use wax, use non-skid floor wax. Do not have throw rugs and other things on the floor that can make you trip. What can I do with my stairs? Do not leave any items on the stairs. Make sure that there are handrails on both sides of the stairs and use them. Fix handrails that are broken or loose. Make sure that handrails are as long as the stairways. Check any carpeting to make sure that it is  firmly attached to the stairs. Fix any carpet that is loose or worn. Avoid having throw rugs at the top or bottom of the stairs. If you do have throw rugs, attach them to the floor with carpet tape. Make sure that you have a light switch at the top of the stairs and the bottom of the stairs. If you do not have them, ask someone to add them for you. What else can I do to help prevent falls? Wear shoes that: Do not have high heels. Have rubber bottoms. Are comfortable and fit you well. Are closed at the toe. Do not wear sandals. If you use a stepladder: Make sure that it is fully opened. Do not climb a closed stepladder. Make sure that both sides of the  stepladder are locked into place. Ask someone to hold it for you, if possible. Clearly mark and make sure that you can see: Any grab bars or handrails. First and last steps. Where the edge of each step is. Use tools that help you move around (mobility aids) if they are needed. These include: Canes. Walkers. Scooters. Crutches. Turn on the lights when you go into a dark area. Replace any light bulbs as soon as they burn out. Set up your furniture so you have a clear path. Avoid moving your furniture around. If any of your floors are uneven, fix them. If there are any pets around you, be aware of where they are. Review your medicines with your doctor. Some medicines can make you feel dizzy. This can increase your chance of falling. Ask your doctor what other things that you can do to help prevent falls. This information is not intended to replace advice given to you by your health care provider. Make sure you discuss any questions you have with your health care provider. Document Released: 05/15/2009 Document Revised: 12/25/2015 Document Reviewed: 08/23/2014 Elsevier Interactive Patient Education  2017 [employer].

## 2022-02-03 NOTE — Progress Notes (Signed)
Jeri Cos [place]leton,acting as a Neurosurgeon for [name], MD.,have documented all relevant documentation on the behalf of [name], MD,as directed by  [name], MD while in the presence of [name], MD.  This visit occurred during the SARS-CoV-2 public health emergency.  Safety protocols were in place, including screening questions prior to the visit, additional usage of staff PPE, and extensive cleaning of exam room while observing appropriate contact time as indicated for disinfecting solutions.  Subjective:     Patient ID: Raymond Welch , male    DOB: 15-Feb-1951 , 71 y.o.   MRN: [id number]   Chief Complaint  Patient presents with   Hypertension    HPI  The patient is here to day for a follow-up on his bp.  He reports compliance with meds. He denies headaches, chest pain and shortness of breath.   He was also seen by [facility] Advisor for AWV today.   Hypertension This is a chronic problem. The current episode started more than 1 year ago. The problem has been gradually improving since onset. The problem is controlled. Pertinent negatives include no palpitations or shortness of breath. Past treatments include angiotensin blockers and diuretics. The current treatment provides moderate improvement. Compliance problems include exercise.  Hypertensive end-organ damage includes kidney disease. There is no history of hyperaldosteronism, hypercortisolism or sleep apnea.     Past Medical History:  Diagnosis Date   Cardiomyopathy (HCC)    Hypertension    Low back pain    Pure hypercholesterolemia    Sleep apnea      Family History  Problem Relation Age of Onset   Diabetes Mother    Heart attack Mother 59       Died age 39   Aneurysm Father 54   Lung cancer Brother      Current Outpatient Medications:    atorvastatin (LIPITOR) 40 MG tablet, Take 1 tablet (40 mg total) by mouth daily., Disp: 90 tablet, Rfl: 3   gabapentin (NEURONTIN) 100 MG capsule, One capsule po  qpm x 7 days, then increase to 2 capsules po qpm, Disp: 30 capsule, Rfl: 2   glucose blood test strip, 1 each by Other route daily as needed for other (blood sugar). Use as instructed, Disp: , Rfl:    losartan-hydrochlorothiazide (HYZAAR) 100-12.5 MG tablet, TAKE 1 TABLET BY MOUTH  DAILY, Disp: 90 tablet, Rfl: 3   metoprolol succinate (TOPROL-XL) 50 MG 24 hr tablet, TAKE 1 TABLET BY MOUTH  DAILY WITH OR IMMEDIATLEY  FOLLOWING A MEAL, Disp: 90 tablet, Rfl: 3   ONETOUCH DELICA LANCETS 33G MISC, 1 each by Does not apply route daily as needed (blood sugar). , Disp: , Rfl:    oxyCODONE-acetaminophen (PERCOCET) 10-325 MG tablet, Take 1 tablet by mouth every 8 (eight) hours as needed for pain. , Disp: , Rfl:    RELISTOR 150 MG TABS, Take 3 tablets by mouth every morning. (Patient not taking: Reported on 02/03/2022), Disp: , Rfl:    RYBELSUS 3 MG TABS, TAKE 1 TABLET BY MOUTH EVERY DAY, Disp: 30 tablet, Rfl: 3   spironolactone (ALDACTONE) 25 MG tablet, Take 25 mg by mouth daily., Disp: , Rfl:    tadalafil (CIALIS) 20 MG tablet, TAKE 1 TABLET BY MOUTH ONCE DAILY AS NEEDED FOR ERECTILE DYSFUNCTION, Disp: 30 tablet, Rfl: 0   No Known Allergies   Review of Systems  Constitutional: Negative.   Eyes: Negative.   Respiratory: Negative.  Negative for shortness of breath.  Cardiovascular: Negative.  Negative for palpitations.  Gastrointestinal: Negative.   Musculoskeletal: Negative.   Skin: Negative.   Neurological: Negative.   Psychiatric/Behavioral: Negative.       Today's Vitals   02/03/22 0832  BP: 130/80  Pulse: 70  Temp: 98.3 F (36.8 C)  Weight: 263 lb (119.3 kg)  Height: 6\' 2"  (1.88 m)   Body mass index is 33.77 kg/m.  Wt Readings from Last 3 Encounters:  02/03/22 263 lb (119.3 kg)  02/03/22 263 lb 3.2 oz (119.4 kg)  12/21/21 257 lb 12.8 oz (116.9 kg)     Objective:  Physical Exam Vitals and nursing note reviewed.  Constitutional:      Appearance: Normal appearance. He is obese.   HENT:     Head: Normocephalic and atraumatic.  Eyes:     Extraocular Movements: Extraocular movements intact.  Cardiovascular:     Rate and Rhythm: Normal rate and regular rhythm.     Heart sounds: Normal heart sounds.  Pulmonary:     Effort: Pulmonary effort is normal.     Breath sounds: Normal breath sounds.  Musculoskeletal:     Cervical back: Normal range of motion.  Skin:    General: Skin is warm.  Neurological:     General: No focal deficit present.     Mental Status: He is alert.  Psychiatric:        Mood and Affect: Mood normal.      Assessment And Plan:     1. Parenchymal renal hypertension, stage 1 through stage 4 or unspecified chronic kidney disease Comments: Chronic, controlled. He will c/w losartan/hctz and metoprolol. He will rto in Oct 2023 for re-evaluation.   2. Chronic renal disease, stage II Comments: Chronic, encouraged to avoid NSAIDs, stay hydrated and keep BP well controlled to decrease risk of CKD progression.   3. Pre-diabetes Comments: Pt advised insurance no longer willing to cover Rybelsus for pre-diabetes. Importance of dietary compliance was d/w patient.   4. Primary osteoarthritis of left hip Comments: He is scheduled for left THR on 02/23/22.   5. Class 1 obesity due to excess calories with serious comorbidity and body mass index (BMI) of 33.0 to 33.9 in adult Comments: He was advised of 6 lb weight gain since  May 2023. He is encouraged to incorporate more exercise into his daily routine.    Patient was given opportunity to ask questions. Patient verbalized understanding of the plan and was able to repeat key elements of the plan. All questions were answered to their satisfaction.   I, June 2023, MD, have reviewed all documentation for this visit. The documentation on 02/03/22 for the exam, diagnosis, procedures, and orders are all accurate and complete.   IF YOU HAVE BEEN REFERRED TO A SPECIALIST, IT MAY TAKE 1-2 WEEKS TO  SCHEDULE/PROCESS THE REFERRAL. IF YOU HAVE NOT HEARD FROM US/SPECIALIST IN TWO WEEKS, PLEASE GIVE 04/06/22 A CALL AT [phone].   THE PATIENT IS ENCOURAGED TO PRACTICE SOCIAL DISTANCING DUE TO THE COVID-19 PANDEMIC.

## 2022-02-04 DIAGNOSIS — G4733 Obstructive sleep apnea (adult) (pediatric): Secondary | ICD-10-CM | POA: Diagnosis not present

## 2022-02-09 NOTE — Patient Instructions (Addendum)
DUE TO COVID-19 ONLY TWO VISITORS  (aged 72 and older)  ARE ALLOWED TO COME WITH YOU AND STAY IN THE WAITING ROOM ONLY DURING PRE OP AND PROCEDURE.   **NO VISITORS ARE ALLOWED IN THE SHORT STAY AREA OR RECOVERY ROOM!!**  IF YOU WILL BE ADMITTED INTO THE HOSPITAL YOU ARE ALLOWED ONLY FOUR SUPPORT PEOPLE DURING VISITATION HOURS ONLY (7 AM -8PM)   The support person(s) must pass our screening, gel in and out, and wear a mask at all times, including in the patient's room. Patients must also wear a mask when staff or their support person are in the room. Visitors GUEST BADGE MUST BE WORN VISIBLY  One adult visitor may remain with you overnight and MUST be in the room by 8 P.M.     Your procedure is scheduled on: 02/23/22   Report to [facility] Main Entrance    Report to admitting at : 9:00 AM   Call this number if you have problems the morning of surgery [phone]   Do not eat food :After Midnight.   After Midnight you may have the following liquids until: 8:30 AM DAY OF SURGERY  Water Black Coffee (sugar ok, NO MILK/CREAM OR CREAMERS)  Tea (sugar ok, NO MILK/CREAM OR CREAMERS) regular and decaf                             Plain Jell-O (NO RED)                                           Fruit ices (not with fruit pulp, NO RED)                                     Popsicles (NO RED)                                                                  Juice: apple, WHITE grape, WHITE cranberry Sports drinks like Gatorade (NO RED) Clear broth(vegetable,chicken,beef)  Drink  G2 drink AT : 8:30 AM the day of surgery.       The day of surgery:  Drink ONE (1) Pre-Surgery Clear Ensure or G2 at AM the morning of surgery. Drink in one sitting. Do not sip.  This drink was given to you during your hospital  pre-op appointment visit. Nothing else to drink after completing the  Pre-Surgery Clear Ensure or G2.          If you have questions, please contact your surgeon's office.    Oral  Hygiene is also important to reduce your risk of infection.                                    Remember - BRUSH YOUR TEETH THE MORNING OF SURGERY WITH YOUR REGULAR TOOTHPASTE   Do NOT smoke after Midnight   Take these medicines the morning of surgery with A SIP OF WATER: metoprolol. How to Manage Your  Diabetes Before and After Surgery  Why is it important to control my blood sugar before and after surgery? Improving blood sugar levels before and after surgery helps healing and can limit problems. A way of improving blood sugar control is eating a healthy diet by:  Eating less sugar and carbohydrates  Increasing activity/exercise  Talking with your doctor about reaching your blood sugar goals High blood sugars (greater than 180 mg/dL) can raise your risk of infections and slow your recovery, so you will need to focus on controlling your diabetes during the weeks before surgery. Make sure that the doctor who takes care of your diabetes knows about your planned surgery including the date and location.  How do I manage my blood sugar before surgery? Check your blood sugar at least 4 times a day, starting 2 days before surgery, to make sure that the level is not too high or low. Check your blood sugar the morning of your surgery when you wake up and every 2 hours until you get to the Short Stay unit. If your blood sugar is less than 70 mg/dL, you will need to treat for low blood sugar: Do not take insulin. Treat a low blood sugar (less than 70 mg/dL) with  cup of clear juice (cranberry or apple), 4 glucose tablets, OR glucose gel. Recheck blood sugar in 15 minutes after treatment (to make sure it is greater than 70 mg/dL). If your blood sugar is not greater than 70 mg/dL on recheck, call [phone] for further instructions. Report your blood sugar to the short stay nurse when you get to Short Stay.  If you are admitted to the hospital after surgery: Your blood sugar will be checked by the  staff and you will probably be given insulin after surgery (instead of oral diabetes medicines) to make sure you have good blood sugar levels. The goal for blood sugar control after surgery is 80-180 mg/dL.  WHAT DO I DO ABOUT MY DIABETES MEDICATION?  Do not take oral diabetes medicines (pills) the morning of surgery.  THE DAY BEFORE SURGERY, take rybelsus as usual .     THE MORNING OF SURGERY, DO NOT TAKE ANY ORAL DIABETIC MEDICATIONS DAY OF YOUR SURGERY  Bring CPAP mask and tubing day of surgery.                              You may not have any metal on your body including hair pins, jewelry, and body piercing             Do not wear lotions, powders, perfumes/cologne, or deodorant              Men may shave face and neck.   Do not bring valuables to the hospital. [facility] IS NOT             RESPONSIBLE   FOR VALUABLES.   Contacts, dentures or bridgework may not be worn into surgery.   Bring small overnight bag day of surgery.   DO NOT BRING YOUR HOME MEDICATIONS TO THE HOSPITAL. PHARMACY WILL DISPENSE MEDICATIONS LISTED ON YOUR MEDICATION LIST TO YOU DURING YOUR ADMISSION IN THE HOSPITAL!    Patients discharged on the day of surgery will not be allowed to drive home.  Someone NEEDS to stay with you for the first 24 hours after anesthesia.   Special Instructions: Bring a copy of your healthcare power of attorney and living will documents  the day of surgery if you haven't scanned them before.              Please read over the following fact sheets you were given: IF YOU HAVE QUESTIONS ABOUT YOUR PRE-OP INSTRUCTIONS PLEASE CALL [phone]     [facility] - Preparing for Surgery Before surgery, you can play an important role.  Because skin is not sterile, your skin needs to be as free of germs as possible.  You can reduce the number of germs on your skin by washing with CHG (chlorahexidine gluconate) soap before surgery.  CHG is an antiseptic cleaner which kills germs  and bonds with the skin to continue killing germs even after washing. Please DO NOT use if you have an allergy to CHG or antibacterial soaps.  If your skin becomes reddened/irritated stop using the CHG and inform your nurse when you arrive at Short Stay. Do not shave (including legs and underarms) for at least 48 hours prior to the first CHG shower.  You may shave your face/neck. Please follow these instructions carefully:  1.  Shower with CHG Soap the night before surgery and the  morning of Surgery.  2.  If you choose to wash your hair, wash your hair first as usual with your  normal  shampoo.  3.  After you shampoo, rinse your hair and body thoroughly to remove the  shampoo.                           4.  Use CHG as you would any other liquid soap.  You can apply chg directly  to the skin and wash                       Gently with a scrungie or clean washcloth.  5.  Apply the CHG Soap to your body ONLY FROM THE NECK DOWN.   Do not use on face/ open                           Wound or open sores. Avoid contact with eyes, ears mouth and genitals (private parts).                       Wash face,  Genitals (private parts) with your normal soap.             6.  Wash thoroughly, paying special attention to the area where your surgery  will be performed.  7.  Thoroughly rinse your body with warm water from the neck down.  8.  DO NOT shower/wash with your normal soap after using and rinsing off  the CHG Soap.                9.  Pat yourself dry with a clean towel.            10.  Wear clean pajamas.            11.  Place clean sheets on your bed the night of your first shower and do not  sleep with pets. Day of Surgery : Do not apply any lotions/deodorants the morning of surgery.  Please wear clean clothes to the hospital/surgery center.  FAILURE TO FOLLOW THESE INSTRUCTIONS MAY RESULT IN THE CANCELLATION OF YOUR SURGERY PATIENT SIGNATURE_________________________________  NURSE  SIGNATURE__________________________________  ________________________________________________________________________   Raymond Welch  An incentive spirometer is  a tool that can help keep your lungs clear and active. This tool measures how well you are filling your lungs with each breath. Taking long deep breaths may help reverse or decrease the chance of developing breathing (pulmonary) problems (especially infection) following: A long period of time when you are unable to move or be active. BEFORE THE PROCEDURE  If the spirometer includes an indicator to show your best effort, your nurse or respiratory therapist will set it to a desired goal. If possible, sit up straight or lean slightly forward. Try not to slouch. Hold the incentive spirometer in an upright position. INSTRUCTIONS FOR USE  Sit on the edge of your bed if possible, or sit up as far as you can in bed or on a chair. Hold the incentive spirometer in an upright position. Breathe out normally. Place the mouthpiece in your mouth and seal your lips tightly around it. Breathe in slowly and as deeply as possible, raising the piston or the ball toward the top of the column. Hold your breath for 3-5 seconds or for as long as possible. Allow the piston or ball to fall to the bottom of the column. Remove the mouthpiece from your mouth and breathe out normally. Rest for a few seconds and repeat Steps 1 through 7 at least 10 times every 1-2 hours when you are awake. Take your time and take a few normal breaths between deep breaths. The spirometer may include an indicator to show your best effort. Use the indicator as a goal to work toward during each repetition. After each set of 10 deep breaths, practice coughing to be sure your lungs are clear. If you have an incision (the cut made at the time of surgery), support your incision when coughing by placing a pillow or rolled up towels firmly against it. Once you are able to get out of  bed, walk around indoors and cough well. You may stop using the incentive spirometer when instructed by your caregiver.  RISKS AND COMPLICATIONS Take your time so you do not get dizzy or light-headed. If you are in pain, you may need to take or ask for pain medication before doing incentive spirometry. It is harder to take a deep breath if you are having pain. AFTER USE Rest and breathe slowly and easily. It can be helpful to keep track of a log of your progress. Your caregiver can provide you with a simple table to help with this. If you are using the spirometer at home, follow these instructions: Dana IF:  You are having difficultly using the spirometer. You have trouble using the spirometer as often as instructed. Your pain medication is not giving enough relief while using the spirometer. You develop fever of 100.5 F (38.1 C) or higher. SEEK IMMEDIATE MEDICAL CARE IF:  You cough up bloody sputum that had not been present before. You develop fever of 102 F (38.9 C) or greater. You develop worsening pain at or near the incision site. MAKE SURE YOU:  Understand these instructions. Will watch your condition. Will get help right away if you are not doing well or get worse. Document Released: 11/29/2006 Document Revised: 10/11/2011 Document Reviewed: 01/30/2007 [facility] Patient Information 2014 [place], [place].   ________________________________________________________________________

## 2022-02-10 ENCOUNTER — Encounter (HOSPITAL_COMMUNITY): Payer: Self-pay

## 2022-02-10 ENCOUNTER — Other Ambulatory Visit: Payer: Self-pay

## 2022-02-10 ENCOUNTER — Encounter (HOSPITAL_COMMUNITY)
Admission: RE | Admit: 2022-02-10 | Discharge: 2022-02-10 | Disposition: A | Discharge status: Home / Self Care | Payer: Medicare Other | Source: Ambulatory Visit | Attending: Orthopedic Surgery | Admitting: Orthopedic Surgery

## 2022-02-10 VITALS — BP 121/92 | HR 64 | Temp 97.3°F | Ht 74.0 in | Wt 259.0 lb

## 2022-02-10 DIAGNOSIS — M1612 Unilateral primary osteoarthritis, left hip: Secondary | ICD-10-CM | POA: Insufficient documentation

## 2022-02-10 DIAGNOSIS — Z01812 Encounter for preprocedural laboratory examination: Secondary | ICD-10-CM | POA: Diagnosis not present

## 2022-02-10 DIAGNOSIS — I10 Essential (primary) hypertension: Secondary | ICD-10-CM | POA: Insufficient documentation

## 2022-02-10 DIAGNOSIS — Z01818 Encounter for other preprocedural examination: Secondary | ICD-10-CM

## 2022-02-10 DIAGNOSIS — R7303 Prediabetes: Secondary | ICD-10-CM | POA: Insufficient documentation

## 2022-02-10 HISTORY — DX: Unspecified osteoarthritis, unspecified site: M19.90

## 2022-02-10 LAB — BASIC METABOLIC PANEL
Anion gap: 7 (ref 5–15)
BUN: 18 mg/dL (ref 8–23)
CO2: 27 mmol/L (ref 22–32)
Calcium: 9.7 mg/dL (ref 8.9–10.3)
Chloride: 109 mmol/L (ref 98–111)
Creatinine, Ser: 1.4 mg/dL — ABNORMAL HIGH (ref 0.61–1.24)
GFR, Estimated: 54 mL/min — ABNORMAL LOW (ref >60)
Glucose, Bld: 103 mg/dL — ABNORMAL HIGH (ref 70–99)
Potassium: 3.9 mmol/L (ref 3.5–5.1)
Sodium: 143 mmol/L (ref 135–145)

## 2022-02-10 LAB — CBC
HCT: 39.6 % (ref 39.0–52.0)
Hemoglobin: 12.6 g/dL — ABNORMAL LOW (ref 13.0–17.0)
MCH: 30.8 pg (ref 26.0–34.0)
MCHC: 31.8 g/dL (ref 30.0–36.0)
MCV: 96.8 fL (ref 80.0–100.0)
Platelets: 193 10*3/uL (ref 150–400)
RBC: 4.09 MIL/uL — ABNORMAL LOW (ref 4.22–5.81)
RDW: 12.5 % (ref 11.5–15.5)
WBC: 4.4 10*3/uL (ref 4.0–10.5)
nRBC: 0 % (ref 0.0–0.2)

## 2022-02-10 LAB — SURGICAL PCR SCREEN
MRSA, PCR: NEGATIVE
Staphylococcus aureus: NEGATIVE

## 2022-02-10 LAB — TYPE AND SCREEN
ABO/RH(D): O POS
Antibody Screen: NEGATIVE

## 2022-02-10 LAB — GLUCOSE, CAPILLARY: Glucose-Capillary: 99 mg/dL (ref 70–99)

## 2022-02-10 NOTE — Progress Notes (Signed)
For Short Stay: COVID SWAB appointment date: Date of COVID positive in last 90 days:  Bowel Prep reminder:   For Anesthesia: PCP - Dr. Dorothyann Peng Cardiologist - Dr. Rollene Rotunda.: 12/21/21 Clearance: Eligha Bridegroom: 12/21/21: EPIC Chest x-ray -  EKG - 12/21/21 Stress Test -  ECHO - 12/12/19 Cardiac Cath - 12/13/17 Pacemaker/ICD device last checked: Pacemaker orders received: Device Rep notified:  Spinal Cord Stimulator:  Sleep Study - Yes CPAP - Yes  Fasting Blood Sugar -  Checks Blood Sugar _____ times a day Date and result of last Hgb A1c-5.5: 12/01/21  Blood Thinner Instructions: Aspirin Instructions: Last Dose:  Activity level: Can go up a flight of stairs and activities of daily living without stopping and without chest pain and/or shortness of breath   Able to exercise without chest pain and/or shortness of breath   Unable to go up a flight of stairs without chest pain and/or shortness of breath     Anesthesia review: Hx: HTN,DIA,Cardiomyopathy   Patient denies shortness of breath, fever, cough and chest pain at PAT appointment   Patient verbalized understanding of instructions that were given to them at the PAT appointment. Patient was also instructed that they will need to review over the PAT instructions again at home before surgery.

## 2022-02-14 ENCOUNTER — Other Ambulatory Visit: Payer: Self-pay | Admitting: Internal Medicine

## 2022-02-16 NOTE — Anesthesia Preprocedure Evaluation (Addendum)
Anesthesia Evaluation  Patient identified by MRN, date of birth, ID band Patient awake    Reviewed: Allergy & Precautions, H&P , NPO status , Patient's Chart, lab work & pertinent test results  Airway Mallampati: II   Neck ROM: full    Dental   Pulmonary sleep apnea , former smoker,    breath sounds clear to auscultation       Cardiovascular hypertension,  Rhythm:regular Rate:Normal  TTE (2021): EF 55%   Neuro/Psych    GI/Hepatic   Endo/Other  diabetes, Type 2  Renal/GU Renal InsufficiencyRenal disease     Musculoskeletal  (+) Arthritis ,   Abdominal   Peds  Hematology   Anesthesia Other Findings   Reproductive/Obstetrics                           Anesthesia Physical Anesthesia Plan  ASA: 3  Anesthesia Plan: General   Post-op Pain Management:    Induction: Intravenous  PONV Risk Score and Plan: 2 and Ondansetron, Treatment may vary due to age or medical condition and Dexamethasone  Airway Management Planned: Oral ETT  Additional Equipment:   Intra-op Plan:   Post-operative Plan: Extubation in OR  Informed Consent: I have reviewed the patients History and Physical, chart, labs and discussed the procedure including the risks, benefits and alternatives for the proposed anesthesia with the patient or authorized representative who has indicated his/her understanding and acceptance.     Dental advisory given  Plan Discussed with: CRNA, Anesthesiologist and Surgeon  Anesthesia Plan Comments: (See PAT note 02/10/2022)      Anesthesia Quick Evaluation

## 2022-02-16 NOTE — Progress Notes (Signed)
Anesthesia Chart Review   Case: [id number] Date/Time: 02/23/22 1120   Procedure: TOTAL HIP ARTHROPLASTY ANTERIOR APPROACH (Left: Hip)   Anesthesia type: Spinal   Pre-op diagnosis: Left hip osteoarthritis   Location: [facility] ROOM 10 / [facility]   Surgeons: Durene Romans, MD       DISCUSSION:71 y.o. former smoker with h/o HTN, sleep apnea, DM II, chronic renal disease, cardiomyopathy, left hip OA scheduled for above procedure 02/23/2022 with Dr. Durene Romans.   Per cardiology preoperative evaluation 12/21/2021, 'He was evaluated in our office by Dr. Antoine Poche on 12/21/21. Given past medical history and time since last visit, based on ACC/AHA guidelines, Raymond Welch would be at acceptable risk for the planned procedure without further cardiovascular testing. "  Anticipate pt can proceed with planned procedure barring acute status change.   VS: BP (!) 121/92   Pulse 64   Temp (!) 36.3 C (Oral)   Ht 6\' 2"  (1.88 m)   Wt 117.5 kg   SpO2 100%   BMI 33.25 kg/m   PROVIDERS: , MD is PCP   Cardiologist - Dr. Dorothyann Peng  LABS: Labs reviewed: Acceptable for surgery. (all labs ordered are listed, but only abnormal results are displayed)  Labs Reviewed  BASIC METABOLIC PANEL - Abnormal; Notable for the following components:      Result Value   Glucose, Bld 103 (*)    Creatinine, Ser 1.40 (*)    GFR, Estimated 54 (*)    All other components within normal limits  CBC - Abnormal; Notable for the following components:   RBC 4.09 (*)    Hemoglobin 12.6 (*)    All other components within normal limits  SURGICAL PCR SCREEN  GLUCOSE, CAPILLARY  TYPE AND SCREEN     IMAGES:   EKG: 12/21/2021 Rate 80 bpm  Sinus rhythm with occasional and consecutive PVCs  CV: Echo 12/12/2019 1. Left ventricular ejection fraction, by estimation, is 55 to 60%. The  left ventricle has normal function. The left ventricle has no regional  wall motion abnormalities. There is moderate left ventricular  hypertrophy.  Left ventricular diastolic  parameters are consistent with Grade I diastolic dysfunction (impaired  relaxation). Elevated left ventricular end-diastolic pressure.   2. Right ventricular systolic function is normal. The right ventricular  size is normal.   3. The mitral valve is grossly normal. Trivial mitral valve  regurgitation.   4. The aortic valve was not well visualized. Aortic valve regurgitation  is not visualized.   5. The inferior vena cava is normal in size with greater than 50%  respiratory variability, suggesting right atrial pressure of 3 mmHg.   Cardiac Cath 12/13/2017 There is mild to moderate left ventricular systolic dysfunction. LV end diastolic pressure is mildly elevated. Mid RCA lesion is 30% stenosed. Dist RCA lesion is 30% stenosed. Prox LAD lesion is 30% stenosed. Ost Ramus to Ramus lesion is 50% stenosed.   1.  Diffuse nonobstructive coronary artery disease with mild diffuse stenosis of the LAD, moderate stenosis of the small intermediate branch, mild stenosis of the left circumflex, and diffuse ectasia with mild nonobstructive disease of the RCA 2.  Mild global and segmental LV systolic dysfunction with LVEF estimated at 45% 3. Mildly elevated LVEDP   Recommend: medical therapy for nonobstructive CAD and chronic systolic heart failure. Consider outpatient echo. Past Medical History:  Diagnosis Date   Arthritis    Cardiomyopathy (HCC)    Diabetes mellitus without complication (HCC)    Hypertension  Low back pain    Pure hypercholesterolemia    Sleep apnea     Past Surgical History:  Procedure Laterality Date   APPENDECTOMY     BACK SURGERY     LEFT HEART CATH AND CORONARY ANGIOGRAPHY N/A 12/13/2017   Procedure: LEFT HEART CATH AND CORONARY ANGIOGRAPHY;  Surgeon: Tonny Bollman, MD;  Location: [facility] INVASIVE CV LAB;  Service: Cardiovascular;  Laterality: N/A;    MEDICATIONS:  atorvastatin (LIPITOR) 40 MG tablet   diclofenac  (VOLTAREN) 75 MG EC tablet   glucose blood test strip   losartan-hydrochlorothiazide (HYZAAR) 100-12.5 MG tablet   metoprolol succinate (TOPROL-XL) 50 MG 24 hr tablet   ONETOUCH DELICA LANCETS 33G MISC   oxyCODONE-acetaminophen (PERCOCET) 10-325 MG tablet   RYBELSUS 3 MG TABS   spironolactone (ALDACTONE) 25 MG tablet   tadalafil (CIALIS) 20 MG tablet   No current facility-administered medications for this encounter.   Raymond Cipro Ward, PA-C [facility]Surgical Testing [phone]

## 2022-02-23 ENCOUNTER — Ambulatory Visit (HOSPITAL_COMMUNITY): Payer: Medicare Other

## 2022-02-23 ENCOUNTER — Ambulatory Visit (HOSPITAL_BASED_OUTPATIENT_CLINIC_OR_DEPARTMENT_OTHER): Payer: Medicare Other | Admitting: Certified Registered Nurse Anesthetist

## 2022-02-23 ENCOUNTER — Encounter (HOSPITAL_COMMUNITY): Payer: Self-pay | Admitting: Orthopedic Surgery

## 2022-02-23 ENCOUNTER — Ambulatory Visit (HOSPITAL_COMMUNITY): Payer: Medicare Other | Admitting: Physician Assistant

## 2022-02-23 ENCOUNTER — Other Ambulatory Visit: Payer: Self-pay

## 2022-02-23 ENCOUNTER — Observation Stay (HOSPITAL_COMMUNITY)
Admission: RE | Admit: 2022-02-23 | Discharge: 2022-02-24 | Disposition: A | Discharge status: Home / Self Care | Payer: Medicare Other | Source: Ambulatory Visit | Attending: Orthopedic Surgery | Admitting: Orthopedic Surgery

## 2022-02-23 ENCOUNTER — Observation Stay (HOSPITAL_COMMUNITY): Payer: Medicare Other

## 2022-02-23 ENCOUNTER — Encounter (HOSPITAL_COMMUNITY)
Admission: RE | Disposition: A | Discharge status: Home / Self Care | Payer: Self-pay | Source: Ambulatory Visit | Attending: Orthopedic Surgery

## 2022-02-23 DIAGNOSIS — Z87891 Personal history of nicotine dependence: Secondary | ICD-10-CM

## 2022-02-23 DIAGNOSIS — Z79899 Other long term (current) drug therapy: Secondary | ICD-10-CM | POA: Insufficient documentation

## 2022-02-23 DIAGNOSIS — I129 Hypertensive chronic kidney disease with stage 1 through stage 4 chronic kidney disease, or unspecified chronic kidney disease: Secondary | ICD-10-CM | POA: Insufficient documentation

## 2022-02-23 DIAGNOSIS — I10 Essential (primary) hypertension: Secondary | ICD-10-CM | POA: Diagnosis not present

## 2022-02-23 DIAGNOSIS — Z7984 Long term (current) use of oral hypoglycemic drugs: Secondary | ICD-10-CM

## 2022-02-23 DIAGNOSIS — Z96642 Presence of left artificial hip joint: Secondary | ICD-10-CM | POA: Diagnosis not present

## 2022-02-23 DIAGNOSIS — N182 Chronic kidney disease, stage 2 (mild): Secondary | ICD-10-CM | POA: Diagnosis not present

## 2022-02-23 DIAGNOSIS — M1612 Unilateral primary osteoarthritis, left hip: Principal | ICD-10-CM | POA: Insufficient documentation

## 2022-02-23 DIAGNOSIS — E119 Type 2 diabetes mellitus without complications: Secondary | ICD-10-CM

## 2022-02-23 DIAGNOSIS — Z471 Aftercare following joint replacement surgery: Secondary | ICD-10-CM | POA: Diagnosis not present

## 2022-02-23 HISTORY — PX: TOTAL HIP ARTHROPLASTY: SHX124

## 2022-02-23 LAB — GLUCOSE, CAPILLARY
Glucose-Capillary: 87 mg/dL (ref 70–99)
Glucose-Capillary: 108 mg/dL — ABNORMAL HIGH (ref 70–99)

## 2022-02-23 LAB — ABO/RH: ABO/RH(D): O POS

## 2022-02-23 SURGERY — ARTHROPLASTY, HIP, TOTAL, ANTERIOR APPROACH
Anesthesia: General | Site: Hip | Laterality: Left

## 2022-02-23 MED ORDER — MIDAZOLAM HCL 2 MG/2ML IJ SOLN
1.0000 mg | Freq: Once | INTRAMUSCULAR | Status: DC
  Filled 2022-02-23: qty 2

## 2022-02-23 MED ORDER — PROPOFOL 10 MG/ML IV BOLUS
INTRAVENOUS | Status: AC
  Filled 2022-02-23: qty 20

## 2022-02-23 MED ORDER — FENTANYL CITRATE PF 50 MCG/ML IJ SOSY
50.0000 ug | PREFILLED_SYRINGE | Freq: Once | INTRAMUSCULAR | Status: DC
  Filled 2022-02-23: qty 2

## 2022-02-23 MED ORDER — LOSARTAN POTASSIUM 50 MG PO TABS
100.0000 mg | ORAL_TABLET | Freq: Every day | ORAL | Status: DC
  Administered 2022-02-24: 100 mg via ORAL
  Filled 2022-02-23: qty 2

## 2022-02-23 MED ORDER — POVIDONE-IODINE 10 % EX SWAB: Freq: Once | CUTANEOUS | Status: AC

## 2022-02-23 MED ORDER — ONDANSETRON HCL 4 MG/2ML IJ SOLN: 4.0000 mg | Freq: Four times a day (QID) | INTRAMUSCULAR | Status: DC | PRN

## 2022-02-23 MED ORDER — ONDANSETRON HCL 4 MG/2ML IJ SOLN
INTRAMUSCULAR | Status: DC | PRN
  Administered 2022-02-23: 4 mg via INTRAVENOUS

## 2022-02-23 MED ORDER — FENTANYL CITRATE (PF) 100 MCG/2ML IJ SOLN
INTRAMUSCULAR | Status: DC | PRN
  Administered 2022-02-23: 100 ug via INTRAVENOUS
  Administered 2022-02-23 (×5): 50 ug via INTRAVENOUS

## 2022-02-23 MED ORDER — FENTANYL CITRATE PF 50 MCG/ML IJ SOSY
25.0000 ug | PREFILLED_SYRINGE | INTRAMUSCULAR | Status: DC | PRN
  Administered 2022-02-23 (×3): 50 ug via INTRAVENOUS

## 2022-02-23 MED ORDER — LIDOCAINE 2% (20 MG/ML) 5 ML SYRINGE
INTRAMUSCULAR | Status: DC | PRN
  Administered 2022-02-23: 60 mg via INTRAVENOUS

## 2022-02-23 MED ORDER — TRANEXAMIC ACID-NACL 1000-0.7 MG/100ML-% IV SOLN
1000.0000 mg | Freq: Once | INTRAVENOUS | Status: AC
  Administered 2022-02-23: 1000 mg via INTRAVENOUS
  Filled 2022-02-23: qty 100

## 2022-02-23 MED ORDER — DEXAMETHASONE SODIUM PHOSPHATE 10 MG/ML IJ SOLN
8.0000 mg | Freq: Once | INTRAMUSCULAR | Status: AC
  Administered 2022-02-23: 10 mg via INTRAVENOUS

## 2022-02-23 MED ORDER — OXYCODONE HCL 5 MG PO TABS
10.0000 mg | ORAL_TABLET | ORAL | Status: DC | PRN
  Administered 2022-02-24 (×2): 15 mg via ORAL
  Filled 2022-02-23: qty 2
  Filled 2022-02-23 (×2): qty 3

## 2022-02-23 MED ORDER — CHLORHEXIDINE GLUCONATE 0.12 % MT SOLN
15.0000 mL | Freq: Once | OROMUCOSAL | Status: AC
  Administered 2022-02-23: 15 mL via OROMUCOSAL

## 2022-02-23 MED ORDER — SODIUM CHLORIDE 0.9 % IR SOLN
Status: DC | PRN
  Administered 2022-02-23: 1000 mL

## 2022-02-23 MED ORDER — ATORVASTATIN CALCIUM 40 MG PO TABS
40.0000 mg | ORAL_TABLET | Freq: Every day | ORAL | Status: DC
  Administered 2022-02-24: 40 mg via ORAL
  Filled 2022-02-23: qty 1

## 2022-02-23 MED ORDER — LACTATED RINGERS IV SOLN: INTRAVENOUS | Status: DC

## 2022-02-23 MED ORDER — ONDANSETRON HCL 4 MG PO TABS: 4.0000 mg | ORAL_TABLET | Freq: Four times a day (QID) | ORAL | Status: DC | PRN

## 2022-02-23 MED ORDER — DEXAMETHASONE SODIUM PHOSPHATE 10 MG/ML IJ SOLN
10.0000 mg | Freq: Once | INTRAMUSCULAR | Status: AC
  Administered 2022-02-24: 10 mg via INTRAVENOUS
  Filled 2022-02-23: qty 1

## 2022-02-23 MED ORDER — POLYETHYLENE GLYCOL 3350 17 G PO PACK: 17.0000 g | PACK | Freq: Every day | ORAL | Status: DC | PRN

## 2022-02-23 MED ORDER — FENTANYL CITRATE (PF) 100 MCG/2ML IJ SOLN
INTRAMUSCULAR | Status: AC
  Filled 2022-02-23: qty 2

## 2022-02-23 MED ORDER — HYDROMORPHONE HCL 1 MG/ML IJ SOLN
0.5000 mg | INTRAMUSCULAR | Status: DC | PRN
  Administered 2022-02-23: 1 mg via INTRAVENOUS
  Filled 2022-02-23: qty 1

## 2022-02-23 MED ORDER — DEXAMETHASONE SODIUM PHOSPHATE 10 MG/ML IJ SOLN
INTRAMUSCULAR | Status: AC
  Filled 2022-02-23: qty 1

## 2022-02-23 MED ORDER — SEMAGLUTIDE 3 MG PO TABS: 1.0000 | ORAL_TABLET | Freq: Every day | ORAL | Status: DC

## 2022-02-23 MED ORDER — METOCLOPRAMIDE HCL 5 MG/ML IJ SOLN: 5.0000 mg | Freq: Three times a day (TID) | INTRAMUSCULAR | Status: DC | PRN

## 2022-02-23 MED ORDER — FERROUS SULFATE 325 (65 FE) MG PO TABS
325.0000 mg | ORAL_TABLET | Freq: Three times a day (TID) | ORAL | Status: DC
  Administered 2022-02-24: 325 mg via ORAL
  Filled 2022-02-23: qty 1

## 2022-02-23 MED ORDER — ROCURONIUM BROMIDE 10 MG/ML (PF) SYRINGE
PREFILLED_SYRINGE | INTRAVENOUS | Status: AC
  Filled 2022-02-23: qty 10

## 2022-02-23 MED ORDER — SUGAMMADEX SODIUM 200 MG/2ML IV SOLN
INTRAVENOUS | Status: DC | PRN
  Administered 2022-02-23: 300 mg via INTRAVENOUS

## 2022-02-23 MED ORDER — PROPOFOL 10 MG/ML IV BOLUS
INTRAVENOUS | Status: DC | PRN
  Administered 2022-02-23: 200 mg via INTRAVENOUS

## 2022-02-23 MED ORDER — PHENYLEPHRINE HCL-NACL 20-0.9 MG/250ML-% IV SOLN
INTRAVENOUS | Status: DC | PRN
  Administered 2022-02-23: 35 ug/min via INTRAVENOUS

## 2022-02-23 MED ORDER — HYDROCHLOROTHIAZIDE 12.5 MG PO TABS
12.5000 mg | ORAL_TABLET | Freq: Every day | ORAL | Status: DC
  Administered 2022-02-24: 12.5 mg via ORAL
  Filled 2022-02-23: qty 1

## 2022-02-23 MED ORDER — OXYCODONE HCL 5 MG PO TABS
5.0000 mg | ORAL_TABLET | ORAL | Status: DC | PRN
  Administered 2022-02-23 (×2): 10 mg via ORAL
  Filled 2022-02-23: qty 2

## 2022-02-23 MED ORDER — LOSARTAN POTASSIUM-HCTZ 100-12.5 MG PO TABS: 1.0000 | ORAL_TABLET | Freq: Every day | ORAL | Status: DC

## 2022-02-23 MED ORDER — PHENOL 1.4 % MT LIQD: 1.0000 | OROMUCOSAL | Status: DC | PRN

## 2022-02-23 MED ORDER — PHENYLEPHRINE 80 MCG/ML (10ML) SYRINGE FOR IV PUSH (FOR BLOOD PRESSURE SUPPORT)
PREFILLED_SYRINGE | INTRAVENOUS | Status: AC
  Filled 2022-02-23: qty 10

## 2022-02-23 MED ORDER — DOCUSATE SODIUM 100 MG PO CAPS
100.0000 mg | ORAL_CAPSULE | Freq: Two times a day (BID) | ORAL | Status: DC
  Administered 2022-02-23 – 2022-02-24 (×2): 100 mg via ORAL
  Filled 2022-02-23 (×2): qty 1

## 2022-02-23 MED ORDER — MENTHOL 3 MG MT LOZG: 1.0000 | LOZENGE | OROMUCOSAL | Status: DC | PRN

## 2022-02-23 MED ORDER — CEFAZOLIN SODIUM-DEXTROSE 2-4 GM/100ML-% IV SOLN
2.0000 g | INTRAVENOUS | Status: AC
  Administered 2022-02-23: 2 g via INTRAVENOUS
  Filled 2022-02-23: qty 100

## 2022-02-23 MED ORDER — POVIDONE-IODINE 10 % EX SWAB: 2.0000 "application " | Freq: Once | CUTANEOUS | Status: DC

## 2022-02-23 MED ORDER — ORAL CARE MOUTH RINSE: 15.0000 mL | Freq: Once | OROMUCOSAL | Status: AC

## 2022-02-23 MED ORDER — LIDOCAINE HCL (PF) 2 % IJ SOLN
INTRAMUSCULAR | Status: AC
  Filled 2022-02-23: qty 5

## 2022-02-23 MED ORDER — OXYCODONE HCL 5 MG/5ML PO SOLN: 5.0000 mg | Freq: Once | ORAL | Status: DC | PRN

## 2022-02-23 MED ORDER — ACETAMINOPHEN 500 MG PO TABS
1000.0000 mg | ORAL_TABLET | Freq: Four times a day (QID) | ORAL | Status: DC
  Administered 2022-02-23 – 2022-02-24 (×3): 1000 mg via ORAL
  Filled 2022-02-23 (×3): qty 2

## 2022-02-23 MED ORDER — SUGAMMADEX SODIUM 500 MG/5ML IV SOLN
INTRAVENOUS | Status: AC
  Filled 2022-02-23: qty 5

## 2022-02-23 MED ORDER — BISACODYL 10 MG RE SUPP: 10.0000 mg | Freq: Every day | RECTAL | Status: DC | PRN

## 2022-02-23 MED ORDER — METHOCARBAMOL 500 MG IVPB - SIMPLE MED
INTRAVENOUS | Status: AC
  Filled 2022-02-23: qty 55

## 2022-02-23 MED ORDER — METOCLOPRAMIDE HCL 5 MG PO TABS: 5.0000 mg | ORAL_TABLET | Freq: Three times a day (TID) | ORAL | Status: DC | PRN

## 2022-02-23 MED ORDER — TRANEXAMIC ACID-NACL 1000-0.7 MG/100ML-% IV SOLN
1000.0000 mg | INTRAVENOUS | Status: AC
  Administered 2022-02-23: 1000 mg via INTRAVENOUS
  Filled 2022-02-23: qty 100

## 2022-02-23 MED ORDER — ONDANSETRON HCL 4 MG/2ML IJ SOLN
INTRAMUSCULAR | Status: AC
  Filled 2022-02-23: qty 2

## 2022-02-23 MED ORDER — ASPIRIN 81 MG PO CHEW
81.0000 mg | CHEWABLE_TABLET | Freq: Two times a day (BID) | ORAL | Status: DC
  Administered 2022-02-23 – 2022-02-24 (×2): 81 mg via ORAL
  Filled 2022-02-23 (×2): qty 1

## 2022-02-23 MED ORDER — PHENYLEPHRINE 80 MCG/ML (10ML) SYRINGE FOR IV PUSH (FOR BLOOD PRESSURE SUPPORT)
PREFILLED_SYRINGE | INTRAVENOUS | Status: DC | PRN
  Administered 2022-02-23: 80 ug via INTRAVENOUS

## 2022-02-23 MED ORDER — OXYCODONE HCL 5 MG PO TABS: 5.0000 mg | ORAL_TABLET | Freq: Once | ORAL | Status: DC | PRN

## 2022-02-23 MED ORDER — DIPHENHYDRAMINE HCL 12.5 MG/5ML PO ELIX: 12.5000 mg | ORAL_SOLUTION | ORAL | Status: DC | PRN

## 2022-02-23 MED ORDER — METHOCARBAMOL 500 MG IVPB - SIMPLE MED
500.0000 mg | Freq: Four times a day (QID) | INTRAVENOUS | Status: DC | PRN
  Administered 2022-02-23: 500 mg via INTRAVENOUS

## 2022-02-23 MED ORDER — METOPROLOL SUCCINATE ER 50 MG PO TB24
50.0000 mg | ORAL_TABLET | Freq: Every day | ORAL | Status: DC
  Administered 2022-02-24: 50 mg via ORAL
  Filled 2022-02-23: qty 1

## 2022-02-23 MED ORDER — CEFAZOLIN SODIUM-DEXTROSE 2-4 GM/100ML-% IV SOLN
2.0000 g | Freq: Four times a day (QID) | INTRAVENOUS | Status: AC
  Administered 2022-02-23 (×2): 2 g via INTRAVENOUS
  Filled 2022-02-23 (×2): qty 100

## 2022-02-23 MED ORDER — ROCURONIUM BROMIDE 10 MG/ML (PF) SYRINGE
PREFILLED_SYRINGE | INTRAVENOUS | Status: DC | PRN
  Administered 2022-02-23: 60 mg via INTRAVENOUS
  Administered 2022-02-23: 20 mg via INTRAVENOUS

## 2022-02-23 MED ORDER — FENTANYL CITRATE PF 50 MCG/ML IJ SOSY
PREFILLED_SYRINGE | INTRAMUSCULAR | Status: AC
  Filled 2022-02-23: qty 3

## 2022-02-23 MED ORDER — ACETAMINOPHEN 325 MG PO TABS: 325.0000 mg | ORAL_TABLET | Freq: Four times a day (QID) | ORAL | Status: DC | PRN

## 2022-02-23 MED ORDER — FENTANYL CITRATE (PF) 250 MCG/5ML IJ SOLN
INTRAMUSCULAR | Status: AC
  Filled 2022-02-23: qty 5

## 2022-02-23 MED ORDER — SPIRONOLACTONE 25 MG PO TABS
25.0000 mg | ORAL_TABLET | Freq: Every day | ORAL | Status: DC
  Administered 2022-02-24: 25 mg via ORAL
  Filled 2022-02-23: qty 1

## 2022-02-23 MED ORDER — SODIUM CHLORIDE 0.9 % IV SOLN: INTRAVENOUS | Status: DC

## 2022-02-23 MED ORDER — METHOCARBAMOL 500 MG PO TABS
500.0000 mg | ORAL_TABLET | Freq: Four times a day (QID) | ORAL | Status: DC | PRN
  Administered 2022-02-23: 500 mg via ORAL
  Filled 2022-02-23: qty 1

## 2022-02-23 MED ORDER — PROPOFOL 1000 MG/100ML IV EMUL
INTRAVENOUS | Status: AC
  Filled 2022-02-23: qty 100

## 2022-02-23 SURGICAL SUPPLY — 47 items
ADH SKN CLS APL DERMABOND .7 (GAUZE/BANDAGES/DRESSINGS) ×1
BAG COUNTER SPONGE SURGICOUNT (BAG) ×1 IMPLANT
BAG DECANTER FOR FLEXI CONT (MISCELLANEOUS) IMPLANT
BAG SPEC THK2 15X12 ZIP CLS (MISCELLANEOUS)
BAG SPNG CNTER NS LX DISP (BAG) ×1
BAG ZIPLOCK 12X15 (MISCELLANEOUS) IMPLANT
BLADE SAG 18X100X1.27 (BLADE) ×2 IMPLANT
COVER PERINEAL POST (MISCELLANEOUS) ×2 IMPLANT
COVER SURGICAL LIGHT HANDLE (MISCELLANEOUS) ×2 IMPLANT
CUP ACET PINNACLE SECTR 56MM (Hips) IMPLANT
DERMABOND ADVANCED (GAUZE/BANDAGES/DRESSINGS) ×1
DERMABOND ADVANCED .7 DNX12 (GAUZE/BANDAGES/DRESSINGS) ×1 IMPLANT
DRAPE FOOT SWITCH (DRAPES) ×2 IMPLANT
DRAPE STERI IOBAN 125X83 (DRAPES) ×2 IMPLANT
DRAPE U-SHAPE 47X51 STRL (DRAPES) ×4 IMPLANT
DRESSING AQUACEL AG SP 3.5X10 (GAUZE/BANDAGES/DRESSINGS) ×1 IMPLANT
DRSG AQUACEL AG ADV 3.5X10 (GAUZE/BANDAGES/DRESSINGS) ×1 IMPLANT
DRSG AQUACEL AG SP 3.5X10 (GAUZE/BANDAGES/DRESSINGS) ×2
DURAPREP 26ML APPLICATOR (WOUND CARE) ×2 IMPLANT
ELECT REM PT RETURN 15FT ADLT (MISCELLANEOUS) ×2 IMPLANT
ELIMINATOR HOLE APEX DEPUY (Hips) ×1 IMPLANT
GLOVE BIO SURGEON STRL SZ 6 (GLOVE) ×2 IMPLANT
GLOVE BIOGEL PI IND STRL 6.5 (GLOVE) ×1 IMPLANT
GLOVE BIOGEL PI IND STRL 7.5 (GLOVE) ×1 IMPLANT
GLOVE BIOGEL PI INDICATOR 6.5 (GLOVE) ×1
GLOVE BIOGEL PI INDICATOR 7.5 (GLOVE) ×1
GLOVE ORTHO TXT STRL SZ7.5 (GLOVE) ×4 IMPLANT
GOWN STRL REUS W/ TWL LRG LVL3 (GOWN DISPOSABLE) ×3 IMPLANT
GOWN STRL REUS W/TWL LRG LVL3 (GOWN DISPOSABLE) ×6
HEAD CERAMIC DELTA 36 PLUS 1.5 (Hips) ×1 IMPLANT
HOLDER FOLEY CATH W/STRAP (MISCELLANEOUS) ×2 IMPLANT
KIT TURNOVER KIT A (KITS) ×1 IMPLANT
PACK ANTERIOR HIP CUSTOM (KITS) ×2 IMPLANT
PINNACLE ALTRX PLUS 4 N 36X56 (Hips) ×1 IMPLANT
PINNACLE SECTOR CUP 56MM (Hips) ×2 IMPLANT
SCREW 6.5MMX30MM (Screw) ×1 IMPLANT
SLEEVE SUCTION 125 (MISCELLANEOUS) ×2 IMPLANT
STEM FEM ACTIS HIGH SZ3 (Stem) ×1 IMPLANT
SUT MNCRL AB 4-0 PS2 18 (SUTURE) ×2 IMPLANT
SUT STRATAFIX 0 PDS 27 VIOLET (SUTURE) ×2
SUT VIC AB 1 CT1 36 (SUTURE) ×6 IMPLANT
SUT VIC AB 2-0 CT1 27 (SUTURE) ×4
SUT VIC AB 2-0 CT1 TAPERPNT 27 (SUTURE) ×2 IMPLANT
SUTURE STRATFX 0 PDS 27 VIOLET (SUTURE) ×1 IMPLANT
TRAY FOLEY MTR SLVR 16FR STAT (SET/KITS/TRAYS/PACK) IMPLANT
TUBE SUCTION HIGH CAP CLEAR NV (SUCTIONS) ×2 IMPLANT
WATER STERILE IRR 1000ML POUR (IV SOLUTION) ×2 IMPLANT

## 2022-02-23 NOTE — Care Plan (Signed)
Ortho Bundle Case Management Note  Patient Details  Name: Raymond Welch MRN: [id number] Date of Birth: 1950/08/19  L THA on 02-23-22 DCP:  Home with wife DME:  No needs, has a RW PT:  HEP                   DME Arranged:    DME Agency:     HH Arranged:    HH Agency:     Additional Comments: Please contact me with any questions of if this plan should need to change.    02/23/2022, 10:02 AM

## 2022-02-23 NOTE — H&P (Signed)
TOTAL HIP ADMISSION H&P  Patient is admitted for left total hip arthroplasty.  Subjective:  Chief Complaint: left hip pain  HPI: Raymond Welch, 71 y.o. male, has a history of pain and functional disability in the left hip(s) due to arthritis and patient has failed non-surgical conservative treatments for greater than 12 weeks to include NSAID's and/or analgesics, corticosteriod injections, and activity modification.  Onset of symptoms was gradual starting 2 years ago with gradually worsening course since that time.The patient noted no past surgery on the left hip(s).  Patient currently rates pain in the left hip at 9 out of 10 with activity. Patient has worsening of pain with activity and weight bearing, pain that interfers with activities of daily living, and pain with passive range of motion. Patient has evidence of joint space narrowing by imaging studies. This condition presents safety issues increasing the risk of falls. There is no current active infection.  Patient Active Problem List   Diagnosis Date Noted   Pre-diabetes 02/18/2020   Parenchymal renal hypertension 02/18/2020   Chronic renal disease, stage II 02/18/2020   Educated about COVID-19 virus infection 11/22/2019   Dilated cardiomyopathy (HCC) 02/17/2018   Abnormal nuclear cardiac imaging test 12/12/2017   Essential hypertension 11/30/2017   Sleep apnea 11/30/2017   Dyslipidemia 11/30/2017   Class 2 severe obesity due to excess calories with serious comorbidity and body mass index (BMI) of 35.0 to 35.9 in adult [facility]) 12/31/2016   Chest pain 12/31/2016   Past Medical History:  Diagnosis Date   Arthritis    Cardiomyopathy (HCC)    Diabetes mellitus without complication (HCC)    Hypertension    Low back pain    Pure hypercholesterolemia    Sleep apnea     Past Surgical History:  Procedure Laterality Date   APPENDECTOMY     BACK SURGERY     LEFT HEART CATH AND CORONARY ANGIOGRAPHY N/A 12/13/2017   Procedure: LEFT HEART  CATH AND CORONARY ANGIOGRAPHY;  Surgeon: Tonny Bollman, MD;  Location: [facility] INVASIVE CV LAB;  Service: Cardiovascular;  Laterality: N/A;    No current facility-administered medications for this encounter.   Current Outpatient Medications  Medication Sig Dispense Refill Last Dose   atorvastatin (LIPITOR) 40 MG tablet Take 1 tablet (40 mg total) by mouth daily. 90 tablet 3    diclofenac (VOLTAREN) 75 MG EC tablet Take 75 mg by mouth 2 (two) times daily.      losartan-hydrochlorothiazide (HYZAAR) 100-12.5 MG tablet TAKE 1 TABLET BY MOUTH  DAILY 90 tablet 3    metoprolol succinate (TOPROL-XL) 50 MG 24 hr tablet TAKE 1 TABLET BY MOUTH  DAILY WITH OR IMMEDIATLEY  FOLLOWING A MEAL 90 tablet 3    oxyCODONE-acetaminophen (PERCOCET) 10-325 MG tablet Take 1 tablet by mouth every 6 (six) hours as needed for pain.      RYBELSUS 3 MG TABS TAKE 1 TABLET BY MOUTH EVERY DAY 30 tablet 3    spironolactone (ALDACTONE) 25 MG tablet Take 25 mg by mouth daily.      tadalafil (CIALIS) 20 MG tablet TAKE 1 TABLET BY MOUTH ONCE DAILY AS NEEDED FOR ERECTILE DYSFUNCTION 30 tablet 0    glucose blood test strip 1 each by Other route daily as needed for other (blood sugar). Use as instructed      ONETOUCH DELICA LANCETS 33G MISC 1 each by Does not apply route daily as needed (blood sugar).       No Known Allergies  Social History  Tobacco Use   Smoking status: Former    Packs/day: 0.75    Years: 40.00    Total pack years: 30.00    Types: Cigarettes   Smokeless tobacco: Never   Tobacco comments:    Quit 8 years ago.   Substance Use Topics   Alcohol use: No    Family History  Problem Relation Age of Onset   Diabetes Mother    Heart attack Mother 76       Died age 67   Aneurysm Father 25   Lung cancer Brother      Review of Systems  Constitutional:  Negative for chills and fever.  Respiratory:  Negative for cough and shortness of breath.   Cardiovascular:  Negative for chest pain.  Gastrointestinal:   Negative for nausea and vomiting.  Musculoskeletal:  Positive for arthralgias.     Objective:  Physical Exam Well nourished and well developed. General: Alert and oriented x3, cooperative and pleasant, no acute distress. Head: normocephalic, atraumatic, neck supple. Eyes: EOMI.  Musculoskeletal: Left hip exam: He does have reproducible anterior groin pain with hip flexion internal rotation close to 10 degrees with external rotation of 20 degrees Mild tenderness laterally Intact sensibility distally   Calves soft and nontender. Motor function intact in LE. Strength 5/5 LE bilaterally. Neuro: Distal pulses 2+. Sensation to light touch intact in LE.  Vital signs in last 24 hours:    Labs:   Estimated body mass index is 33.25 kg/m as calculated from the following:   Height as of 02/10/22: 6\' 2"  (1.88 m).   Weight as of 02/10/22: 117.5 kg.   Imaging Review Plain radiographs demonstrate severe degenerative joint disease of the left hip(s). The bone quality appears to be adequate for age and reported activity level.      Assessment/Plan:  End stage arthritis, left hip(s)  The patient history, physical examination, clinical judgement of the provider and imaging studies are consistent with end stage degenerative joint disease of the left hip(s) and total hip arthroplasty is deemed medically necessary. The treatment options including medical management, injection therapy, arthroscopy and arthroplasty were discussed at length. The risks and benefits of total hip arthroplasty were presented and reviewed. The risks due to aseptic loosening, infection, stiffness, dislocation/subluxation,  thromboembolic complications and other imponderables were discussed.  The patient acknowledged the explanation, agreed to proceed with the plan and consent was signed. Patient is being admitted for inpatient treatment for surgery, pain control, PT, OT, prophylactic antibiotics, VTE prophylaxis,  progressive ambulation and ADL's and discharge planning.The patient is planning to be discharged  home.  Therapy Plans: HEP Disposition: Home with wife 04/13/22 Planned DVT Prophylaxis: aspirin 81mg  BID DME needed: none PCP: Dr. Kathie Rhodes, clearance received TXA: IV Allergies: NKDA Anesthesia Concerns: none BMI: 33.3 Last HgbA1c: Not diabetic  Other: - oxycodone 10-15 q4h, robaxin, celebrex - Takes perocet 10 mg every 6 hours now  , PA-C Orthopedic Surgery [facility] [place] [phone]

## 2022-02-23 NOTE — Transfer of Care (Signed)
Immediate Anesthesia Transfer of Care Note  Patient: Raymond Welch  Procedure(s) Performed: TOTAL HIP ARTHROPLASTY ANTERIOR APPROACH (Left: Hip)  Patient Location: PACU  Anesthesia Type:General  Level of Consciousness: awake, alert  and oriented  Airway & Oxygen Therapy: Patient Spontanous Breathing and Patient connected to face mask oxygen  Post-op Assessment: Report given to RN and Post -op Vital signs reviewed and stable  Post vital signs: Reviewed and stable  Last Vitals:  Vitals Value Taken Time  BP    Temp    Pulse 81 02/23/22 1248  Resp 13 02/23/22 1248  SpO2 100 % 02/23/22 1248  Vitals shown include unvalidated device data.  Last Pain:  Vitals:   02/23/22 0912  TempSrc:   PainSc: 7          Complications: No notable events documented.

## 2022-02-23 NOTE — Discharge Instructions (Signed)

## 2022-02-23 NOTE — Progress Notes (Signed)
Pt refused cpap

## 2022-02-23 NOTE — Op Note (Signed)
NAME:  Raymond Welch                ACCOUNT NO.: [id number]      MEDICAL RECORD NO.: [id number]      FACILITY:  [facility]      PHYSICIAN:  Shelda Pal  DATE OF BIRTH:  22-Feb-1951     DATE OF PROCEDURE:  02/23/2022                                 OPERATIVE REPORT         PREOPERATIVE DIAGNOSIS: Left  hip osteoarthritis.      POSTOPERATIVE DIAGNOSIS:  Left hip osteoarthritis.      PROCEDURE:  Left total hip replacement through an anterior approach   utilizing DePuy THR system, component size 54 mm pinnacle cup, a size 36+4 neutral   Altrex liner, a size 3 Hi Actis stem with a 36+1.5 delta ceramic   ball.      SURGEON:  Madlyn Frankel. Charlann Boxer, M.D.      ASSISTANT:  Rosalene Billings, PA-C     ANESTHESIA:  General and Spinal.      SPECIMENS:  None.      COMPLICATIONS:  None.      BLOOD LOSS:  600 cc     DRAINS:  None.      INDICATION OF THE PROCEDURE:  [name] is a 71 y.o. male who had   presented to office for evaluation of left hip pain.  Radiographs revealed   progressive degenerative changes with bone-on-bone   articulation of the  hip joint, including subchondral cystic changes and osteophytes.  The patient had painful limited range of   motion significantly affecting their overall quality of life and function.  The patient was failing to    respond to conservative measures including medications and/or injections and activity modification and at this point was ready   to proceed with more definitive measures.  Consent was obtained for   benefit of pain relief.  Specific risks of infection, DVT, component   failure, dislocation, neurovascular injury, and need for revision surgery were reviewed in the office.     PROCEDURE IN DETAIL:  The patient was brought to operative theater.   Once adequate anesthesia, preoperative antibiotics, 2 gm of Ancef, 1 gm of Tranexamic Acid, and 10 mg of Decadron were administered, the patient was positioned supine on the [employer] table.  Once the patient was safely positioned with adequate padding of boney prominences we predraped out the hip, and used fluoroscopy to confirm orientation of the pelvis.      The left hip was then prepped and draped from proximal iliac crest to   mid thigh with a shower curtain technique.      Time-out was performed identifying the patient, planned procedure, and the appropriate extremity.     An incision was then made 2 cm lateral to the   anterior superior iliac spine extending over the orientation of the   tensor fascia lata muscle and sharp dissection was carried down to the   fascia of the muscle.      The fascia was then incised.  The muscle belly was identified and swept   laterally and retractor placed along the superior neck.  Following   cauterization of the circumflex vessels and removing some pericapsular   fat, a second cobra retractor was placed on the inferior  neck.  A T-capsulotomy was made along the line of the   superior neck to the trochanteric fossa, then extended proximally and   distally.  Tag sutures were placed and the retractors were then placed   intracapsular.  We then identified the trochanteric fossa and   orientation of my neck cut and then made a neck osteotomy with the femur on traction.  The femoral   head was removed without difficulty or complication.  Traction was let   off and retractors were placed posterior and anterior around the   acetabulum.      The labrum and foveal tissue were debrided.  I began reaming with a 51 mm   reamer and reamed up to 55 mm reamer with good bony bed preparation and a 56 mm  cup was chosen.  The final 56 mm Pinnacle cup was then impacted under fluoroscopy to confirm the depth of penetration and orientation with respect to   Abduction and forward flexion.  A screw was placed into the ilium followed by the hole eliminator.  The final   36+4 neutral Altrex liner was impacted with good visualized rim fit.  The cup  was positioned anatomically within the acetabular portion of the pelvis.      At this point, the femur was rolled to 100 degrees.  Further capsule was   released off the inferior aspect of the femoral neck.  I then   released the superior capsule proximally.  With the leg in a neutral position the hook was placed laterally   along the femur under the vastus lateralis origin and elevated manually and then held in position using the hook attachment on the bed.  The leg was then extended and adducted with the leg rolled to 100   degrees of external rotation.  Retractors were placed along the medial calcar and posteriorly over the greater trochanter.  Once the proximal femur was fully   exposed, I used a box osteotome to set orientation.  I then began   broaching with the starting chili pepper broach and passed this by hand and then broached up to 3.  With the 3 broach in place I chose a high offset neck and did several trial reductions.  The offset was appropriate, leg lengths   appeared to be equal best matched with the +1.5 head ball trial confirmed radiographically.   Given these findings, I went ahead and dislocated the hip, repositioned all   retractors and positioned the right hip in the extended and abducted position.  The final 3 Hi Actis stem was   chosen and it was impacted down to the level of neck cut.  Based on this   and the trial reductions, a final 36+1.5 delta ceramic ball was chosen and   impacted onto a clean and dry trunnion, and the hip was reduced.  The   hip had been irrigated throughout the case again at this point.  I did   reapproximate the superior capsular leaflet to the anterior leaflet   using #1 Vicryl.  The fascia of the   tensor fascia lata muscle was then reapproximated using #1 Vicryl and #0 Stratafix sutures.  The   remaining wound was closed with 2-0 Vicryl and running 4-0 Monocryl.   The hip was cleaned, dried, and dressed sterilely using Dermabond and    Aquacel dressing.  The patient was then brought   to recovery room in stable condition tolerating the procedure well.    Morrie Sheldon  Lu Duffel, PA-C was present for the entirety of the case involved from   preoperative positioning, perioperative retractor management, general   facilitation of the case, as well as primary wound closure as assistant.            Pietro Cassis Alvan Dame, M.D.        02/23/2022 11:09 AM

## 2022-02-23 NOTE — Interval H&P Note (Signed)
History and Physical Interval Note:  02/23/2022 9:49 AM  Raymond Welch  has presented today for surgery, with the diagnosis of Left hip osteoarthritis.  The various methods of treatment have been discussed with the patient and family. After consideration of risks, benefits and other options for treatment, the patient has consented to  Procedure(s): TOTAL HIP ARTHROPLASTY ANTERIOR APPROACH (Left) as a surgical intervention.  The patient's history has been reviewed, patient examined, no change in status, stable for surgery.  I have reviewed the patient's chart and labs.  Questions were answered to the patient's satisfaction.     Shelda Pal

## 2022-02-23 NOTE — Evaluation (Signed)
Physical Therapy Evaluation Patient Details Name: Raymond Welch MRN: [id number] DOB: [birth year] Today's Date: 02/23/2022  History of Present Illness  Pt is a 71yo male presenting s/p L-THA, AA on 02/23/22. PMH: DM, HTN, cardiomyopathy, back surgery," CKD2.  Clinical Impression  Raymond Welch is a 71 y.o. male POD 0 s/p L-THA, AA. Patient reports modified IND using RW for mobility at baseline. Patient is now limited by functional impairments (see PT problem list below) and requires supervision for bed mobility and min guard for transfers. Patient was able to ambulate 90 feet with RW and min guard level of assist, +2 for chair follow for safety, pt able to void voluntarily. Patient instructed in exercise to facilitate ROM and circulation to manage edema. Provided incentive spirometer and with Vcs pt able to achieve . Patient will benefit from continued skilled PT interventions to address impairments and progress towards PLOF. Acute PT will follow to progress mobility and stair training in preparation for safe discharge home.       Recommendations for follow up therapy are one component of a multi-disciplinary discharge planning process, led by the attending physician.  Recommendations may be updated based on patient status, additional functional criteria and insurance authorization.  Follow Up Recommendations Follow physician's recommendations for discharge plan and follow up therapies      Assistance Recommended at Discharge Set up Supervision/Assistance  Patient can return home with the following  A little help with walking and/or transfers;A little help with bathing/dressing/bathroom;Assistance with cooking/housework;Assist for transportation;Help with stairs or ramp for entrance    Equipment Recommendations None recommended by PT  Recommendations for Other Services       Functional Status Assessment Patient has had a recent decline in their functional status and demonstrates the ability to  make significant improvements in function in a reasonable and predictable amount of time.     Precautions / Restrictions Precautions Precautions: Fall Restrictions Weight Bearing Restrictions: No Other Position/Activity Restrictions: wbat      Mobility  Bed Mobility Overal bed mobility: Needs Assistance Bed Mobility: Supine to Sit     Supine to sit: Supervision     General bed mobility comments: For safety only, no physical assist required    Transfers Overall transfer level: Needs assistance Equipment used: Rolling walker (2 wheels) Transfers: Sit to/from Stand Sit to Stand: From elevated surface, Min guard           General transfer comment: For safety from elevated surface, no physical assist required    Ambulation/Gait Ambulation/Gait assistance: Min guard, +2 safety/equipment Gait Distance (Feet): 90 Feet Assistive device: Rolling walker (2 wheels) Gait Pattern/deviations: Step-to pattern, Step-through pattern Gait velocity: decreased     General Gait Details: Pt ambulated with RW and min guard +2 for recliner follow, no physical assist required or overt LOB noted.  Stairs            Wheelchair Mobility    Modified Rankin (Stroke Patients Only)       Balance Overall balance assessment: Needs assistance Sitting-balance support: Feet supported, No upper extremity supported Sitting balance-Leahy Scale: Fair     Standing balance support: Reliant on assistive device for balance, During functional activity, Bilateral upper extremity supported Standing balance-Leahy Scale: Poor                               Pertinent Vitals/Pain Pain Assessment Pain Assessment: 0-10 Pain Score: 5  Pain Location: L hip Pain Descriptors /  Indicators: Operative site guarding Pain Intervention(s): Monitored during session, Limited activity within patient's tolerance, Repositioned, Ice applied    Home Living Family/patient expects to be discharged  to:: Private residence Living Arrangements: Spouse/significant other Available Help at Discharge: Family;Available 24 hours/day Type of Home: House Home Access: Stairs to enter Entrance Stairs-Rails: Right;Left;Can reach both Entrance Stairs-Number of Steps: 3   Home Layout: One level Home Equipment: Agricultural consultant (2 wheels)      Prior Function Prior Level of Function : Independent/Modified Independent;Driving             Mobility Comments: Uses RW during especially painful periods ADLs Comments: IND     Hand Dominance        Extremity/Trunk Assessment   Upper Extremity Assessment Upper Extremity Assessment: Overall WFL for tasks assessed    Lower Extremity Assessment Lower Extremity Assessment: RLE deficits/detail;LLE deficits/detail RLE Deficits / Details: MMT ank DF/PF 5/5 RLE Sensation: WNL LLE Deficits / Details: MMT ank DF/PF 5/5 LLE Sensation: WNL    Cervical / Trunk Assessment Cervical / Trunk Assessment: Normal  Communication   Communication: No difficulties  Cognition Arousal/Alertness: Awake/alert Behavior During Therapy: WFL for tasks assessed/performed Overall Cognitive Status: Within Functional Limits for tasks assessed                                          General Comments General comments (skin integrity, edema, etc.): Wife Kathie Rhodes present for session    Exercises Total Joint Exercises Ankle Circles/Pumps: AROM, Both, 10 reps   Assessment/Plan    PT Assessment Patient needs continued PT services  PT Problem List Decreased strength;Decreased range of motion;Decreased activity tolerance;Decreased balance;Decreased mobility;Decreased coordination;Decreased safety awareness;Pain       PT Treatment Interventions DME instruction;Gait training;Stair training;Functional mobility training;Therapeutic activities;Therapeutic exercise;Balance training;Patient/family education;Neuromuscular re-education    PT Goals (Current  goals can be found in the Care Plan section)  Acute Rehab PT Goals Patient Stated Goal: get back to walking PT Goal Formulation: With patient/family Time For Goal Achievement: 03/02/22 Potential to Achieve Goals: Good    Frequency 7X/week     Co-evaluation               AM-PAC PT "6 Clicks" Mobility  Outcome Measure Help needed turning from your back to your side while in a flat bed without using bedrails?: None Help needed moving from lying on your back to sitting on the side of a flat bed without using bedrails?: None Help needed moving to and from a bed to a chair (including a wheelchair)?: A Little Help needed standing up from a chair using your arms (e.g., wheelchair or bedside chair)?: A Little Help needed to walk in hospital room?: A Little Help needed climbing 3-5 steps with a railing? : A Little 6 Click Score: 20    End of Session Equipment Utilized During Treatment: Gait belt Activity Tolerance: Patient tolerated treatment well;No increased pain Patient left: in chair;with call bell/phone within reach;with chair alarm set;with family/visitor present;with SCD's reapplied Nurse Communication: Mobility status PT Visit Diagnosis: Pain;Difficulty in walking, not elsewhere classified (R26.2) Pain - Right/Left: Left Pain - part of body: Hip    Time: 3244-0102 PT Time Calculation (min) (ACUTE ONLY): 23 min   Charges:   PT Evaluation $PT Eval Low Complexity: 1 Low          Jamesetta Geralds, PT, DPT [facility] Rehabilitation Department Office:  [phone] Pager: [phone]  Jamesetta Geralds 02/23/2022, 4:19 PM

## 2022-02-23 NOTE — Anesthesia Procedure Notes (Signed)
Procedure Name: Intubation Date/Time: 02/23/2022 11:00 AM  Performed by: Maxwell Caul, CRNAPre-anesthesia Checklist: Patient identified, Emergency Drugs available, Suction available and Patient being monitored Patient Re-evaluated:Patient Re-evaluated prior to induction Oxygen Delivery Method: Circle system utilized Preoxygenation: Pre-oxygenation with 100% oxygen Induction Type: IV induction Ventilation: Mask ventilation without difficulty Laryngoscope Size: Mac and 4 Grade View: Grade II Tube type: Oral Tube size: 7.5 mm Number of attempts: 1 Airway Equipment and Method: Stylet Placement Confirmation: ETT inserted through vocal cords under direct vision, positive ETCO2 and breath sounds checked- equal and bilateral Secured at: 22 cm Tube secured with: Tape Dental Injury: Teeth and Oropharynx as per pre-operative assessment  Comments: Very poor dentition. Loose bottom teeth. No change from pre-op.

## 2022-02-24 ENCOUNTER — Encounter (HOSPITAL_COMMUNITY): Payer: Self-pay | Admitting: Orthopedic Surgery

## 2022-02-24 DIAGNOSIS — E119 Type 2 diabetes mellitus without complications: Secondary | ICD-10-CM | POA: Diagnosis not present

## 2022-02-24 DIAGNOSIS — Z87891 Personal history of nicotine dependence: Secondary | ICD-10-CM | POA: Diagnosis not present

## 2022-02-24 DIAGNOSIS — N182 Chronic kidney disease, stage 2 (mild): Secondary | ICD-10-CM | POA: Diagnosis not present

## 2022-02-24 DIAGNOSIS — M1612 Unilateral primary osteoarthritis, left hip: Secondary | ICD-10-CM | POA: Diagnosis not present

## 2022-02-24 DIAGNOSIS — Z79899 Other long term (current) drug therapy: Secondary | ICD-10-CM | POA: Diagnosis not present

## 2022-02-24 DIAGNOSIS — I129 Hypertensive chronic kidney disease with stage 1 through stage 4 chronic kidney disease, or unspecified chronic kidney disease: Secondary | ICD-10-CM | POA: Diagnosis not present

## 2022-02-24 LAB — CBC
HCT: 36.9 % — ABNORMAL LOW (ref 39.0–52.0)
Hemoglobin: 11.8 g/dL — ABNORMAL LOW (ref 13.0–17.0)
MCH: 31 pg (ref 26.0–34.0)
MCHC: 32 g/dL (ref 30.0–36.0)
MCV: 96.9 fL (ref 80.0–100.0)
Platelets: 165 10*3/uL (ref 150–400)
RBC: 3.81 MIL/uL — ABNORMAL LOW (ref 4.22–5.81)
RDW: 12.1 % (ref 11.5–15.5)
WBC: 10.3 10*3/uL (ref 4.0–10.5)
nRBC: 0 % (ref 0.0–0.2)

## 2022-02-24 LAB — BASIC METABOLIC PANEL
Anion gap: 8 (ref 5–15)
BUN: 19 mg/dL (ref 8–23)
CO2: 27 mmol/L (ref 22–32)
Calcium: 9.2 mg/dL (ref 8.9–10.3)
Chloride: 107 mmol/L (ref 98–111)
Creatinine, Ser: 1.06 mg/dL (ref 0.61–1.24)
GFR, Estimated: >60 mL/min (ref >60)
Glucose, Bld: 136 mg/dL — ABNORMAL HIGH (ref 70–99)
Potassium: 4.1 mmol/L (ref 3.5–5.1)
Sodium: 142 mmol/L (ref 135–145)

## 2022-02-24 MED ORDER — OXYCODONE HCL 5 MG PO TABS: 10.0000 mg | ORAL_TABLET | ORAL | 0 refills | Status: DC | PRN

## 2022-02-24 MED ORDER — ASPIRIN 81 MG PO CHEW: 81.0000 mg | CHEWABLE_TABLET | Freq: Two times a day (BID) | ORAL | 0 refills | Status: AC

## 2022-02-24 MED ORDER — POLYETHYLENE GLYCOL 3350 17 G PO PACK
17.0000 g | PACK | Freq: Every day | ORAL | 0 refills | Status: AC | PRN
Start: 2022-02-24 — End: ?

## 2022-02-24 MED ORDER — OXYCODONE HCL 5 MG PO TABS
10.0000 mg | ORAL_TABLET | ORAL | 0 refills | Status: DC | PRN
Start: 2022-02-24 — End: 2022-02-24

## 2022-02-24 MED ORDER — ACETAMINOPHEN 500 MG PO TABS: 1000.0000 mg | ORAL_TABLET | Freq: Four times a day (QID) | ORAL | 0 refills | Status: AC

## 2022-02-24 MED ORDER — DOCUSATE SODIUM 100 MG PO CAPS: 100.0000 mg | ORAL_CAPSULE | Freq: Two times a day (BID) | ORAL | 0 refills | Status: DC

## 2022-02-24 MED ORDER — OXYCODONE HCL 10 MG PO TABS: 10.0000 mg | ORAL_TABLET | ORAL | 0 refills | Status: AC | PRN

## 2022-02-24 MED ORDER — METHOCARBAMOL 500 MG PO TABS: 500.0000 mg | ORAL_TABLET | Freq: Four times a day (QID) | ORAL | 0 refills | Status: DC | PRN

## 2022-02-24 NOTE — Plan of Care (Signed)
  Problem: Education: Goal: Knowledge of General Education information will improve Description: Including pain rating scale, medication(s)/side effects and non-pharmacologic comfort measures Outcome: Progressing   Problem: Activity: Goal: Risk for activity intolerance will decrease Outcome: Progressing   Problem: Pain Managment: Goal: General experience of comfort will improve Outcome: Progressing   

## 2022-02-24 NOTE — TOC Transition Note (Signed)
Transition of Care [facility]) - CM/SW Discharge Note  Patient Details  Name: Raymond Welch MRN: [id number] Date of Birth: 1951/02/28  Transition of [facility]) CM/SW Contact:  Sherie Don, LCSW Phone Number: 02/24/2022, 10:36 AM  Clinical Narrative: Patient is expected to discharge home after working with PT. CSW met with patient and his wife to review discharge plan. Patient will go home with a home exercise program (HEP). Patient has a rolling walker at home. Patient and wife asked about a 3N1, but declined private pay after CSW explained Medicare has not been covering these. TOC signing off.  Final next level of care: Home/Self Care Barriers to Discharge: No Barriers Identified  Patient Goals and CMS Choice Patient states their goals for this hospitalization and ongoing recovery are:: Discharge home with HEP Choice offered to / list presented to : NA  Discharge Plan and Services         DME Arranged: N/A DME Agency: NA  Readmission Risk Interventions     No data to display

## 2022-02-24 NOTE — Progress Notes (Signed)
Physical Therapy Treatment Patient Details Name: Raymond Welch MRN: [id number] DOB: January 23, 1951 Today's Date: 02/24/2022   History of Present Illness Pt is a 71yo male presenting s/p L-THA, AA on 02/23/22. PMH: DM, HTN, cardiomyopathy, back surgery," CKD2.    PT Comments    Pt seen POD1 & is motivated to participate in therapy. Demonstrated modified independence with bed mobility and transfers, supervision for ambulation in hallway with RW 31f, min guard for stair training full flight with b/l handrails. Provided HEP and appropriate progression with emphasis on walking program; pt completed all exercises with minimal cuing and safe technique. All education completed and pt has no further questions. Pt has met mobility goals for safe discharge, PT is signing off, thank you for this referral.    Recommendations for follow up therapy are one component of a multi-disciplinary discharge planning process, led by the attending physician.  Recommendations may be updated based on patient status, additional functional criteria and insurance authorization.  Follow Up Recommendations  Follow physician's recommendations for discharge plan and follow up therapies     Assistance Recommended at Discharge Set up Supervision/Assistance  Patient can return home with the following A little help with walking and/or transfers;A little help with bathing/dressing/bathroom;Assistance with cooking/housework;Assist for transportation;Help with stairs or ramp for entrance   Equipment Recommendations  None recommended by PT    Recommendations for Other Services       Precautions / Restrictions Precautions Precautions: Fall Restrictions Weight Bearing Restrictions: No Other Position/Activity Restrictions: wbat     Mobility  Bed Mobility Overal bed mobility: Modified Independent Bed Mobility: Supine to Sit     Supine to sit: Modified independent (Device/Increase time)     General bed mobility comments:  increased time    Transfers Overall transfer level: Needs assistance Equipment used: Rolling walker (2 wheels) Transfers: Sit to/from Stand Sit to Stand: From elevated surface, Modified independent (Device/Increase time)           General transfer comment: increased time    Ambulation/Gait Ambulation/Gait assistance: Supervision Gait Distance (Feet): 300 Feet Assistive device: Rolling walker (2 wheels) Gait Pattern/deviations: Step-to pattern, Step-through pattern Gait velocity: decreased     General Gait Details: Pt ambulated with RW and supervision, no physical assist required or overt LOB noted.   Stairs Stairs: Yes Stairs assistance: Min guard Stair Management: Two rails, Step to pattern, Forwards Number of Stairs: 10 General stair comments: Pt completed stair training with min guard, no overt LOB noted and safe technique, caregiver verbalized appropriate guarding position.   Wheelchair Mobility    Modified Rankin (Stroke Patients Only)       Balance Overall balance assessment: Needs assistance Sitting-balance support: Feet supported, No upper extremity supported Sitting balance-Leahy Scale: Fair     Standing balance support: Reliant on assistive device for balance, During functional activity, Bilateral upper extremity supported Standing balance-Leahy Scale: Poor                              Cognition Arousal/Alertness: Awake/alert Behavior During Therapy: WFL for tasks assessed/performed Overall Cognitive Status: Within Functional Limits for tasks assessed                                          Exercises Total Joint Exercises Ankle Circles/Pumps: AROM, Both, 10 reps Quad Sets: AROM, Both, 10 reps Short Arc  Quad: AROM, Left, 10 reps Heel Slides: AROM, Left, 10 reps Hip ABduction/ADduction: AROM, Left, 10 reps Long Arc Quad: Other (comment) (educated, not performed) Marching in Standing: Other (comment) (educated,  not performed) Standing Hip Extension: Other (comment) (educated, not performed)    General Comments General comments (skin integrity, edema, etc.): Wife bettty present for session      Pertinent Vitals/Pain Pain Assessment Pain Assessment: 0-10 Pain Score: 5  Pain Location: L hip Pain Descriptors / Indicators: Operative site guarding Pain Intervention(s): Ice applied, Repositioned, Monitored during session, Limited activity within patient's tolerance    Home Living                          Prior Function            PT Goals (current goals can now be found in the care plan section) Acute Rehab PT Goals Patient Stated Goal: get back to walking PT Goal Formulation: With patient/family Time For Goal Achievement: 03/02/22 Potential to Achieve Goals: Good Progress towards PT goals: Goals met/education completed, patient discharged from PT    Frequency    7X/week      PT Plan Current plan remains appropriate    Co-evaluation              AM-PAC PT "6 Clicks" Mobility   Outcome Measure  Help needed turning from your back to your side while in a flat bed without using bedrails?: None Help needed moving from lying on your back to sitting on the side of a flat bed without using bedrails?: None Help needed moving to and from a bed to a chair (including a wheelchair)?: A Little Help needed standing up from a chair using your arms (e.g., wheelchair or bedside chair)?: A Little Help needed to walk in hospital room?: A Little Help needed climbing 3-5 steps with a railing? : A Little 6 Click Score: 20    End of Session Equipment Utilized During Treatment: Gait belt Activity Tolerance: Patient tolerated treatment well;No increased pain Patient left: in chair;with call bell/phone within reach;with chair alarm set;with family/visitor present Nurse Communication: Mobility status PT Visit Diagnosis: Pain;Difficulty in walking, not elsewhere classified  (R26.2) Pain - Right/Left: Left Pain - part of body: Hip     Time: 5859-2924 PT Time Calculation (min) (ACUTE ONLY): 21 min  Charges:  $Gait Training: 8-22 mins                     Coolidge Breeze, PT, DPT [facility] Office: [phone] Pager: [phone]   Coolidge Breeze 02/24/2022, 11:27 AM

## 2022-02-24 NOTE — Progress Notes (Signed)
Patient states he has DME equipment at home already (walker and commode)

## 2022-02-24 NOTE — Progress Notes (Signed)
   Subjective: 1 Day Post-Op Procedure(s) (LRB): TOTAL HIP ARTHROPLASTY ANTERIOR APPROACH (Left) Patient reports pain as mild.   Patient seen in rounds by Dr. Charlann Boxer. Patient is resting in bed on exam this morning.  No acute events overnight. Ambulated 90 feet with PT yesterday. We will continue therapy today.   Objective: Vital signs in last 24 hours: Temp:  [97.5 F (36.4 C)-98.9 F (37.2 C)] 98.3 F (36.8 C) (07/26 0554) Pulse Rate:  [73-92] 78 (07/26 0554) Resp:  [10-22] 17 (07/26 0554) BP: (116-167)/(68-98) 142/84 (07/26 0554) SpO2:  [85 %-100 %] 99 % (07/26 0554) Weight:  [117.5 kg] 117.5 kg (07/25 1418)  Intake/Output from previous day:  Intake/Output Summary (Last 24 hours) at 02/24/2022 0755 Last data filed at 02/24/2022 0650 Gross per 24 hour  Intake 3164.86 ml  Output 2025 ml  Net 1139.86 ml     Intake/Output this shift: No intake/output data recorded.  Labs: Recent Labs    02/24/22 0402  HGB 11.8*   Recent Labs    02/24/22 0402  WBC 10.3  RBC 3.81*  HCT 36.9*  PLT 165   Recent Labs    02/24/22 0402  NA 142  K 4.1  CL 107  CO2 27  BUN 19  CREATININE 1.06  GLUCOSE 136*  CALCIUM 9.2   No results for input(s): "LABPT", "INR" in the last 72 hours.  Exam: General - Patient is Alert and Oriented Extremity - Neurologically intact Sensation intact distally Intact pulses distally Dorsiflexion/Plantar flexion intact Dressing - dressing C/D/I Motor Function - intact, moving foot and toes well on exam.   Past Medical History:  Diagnosis Date   Arthritis    Cardiomyopathy (HCC)    Diabetes mellitus without complication (HCC)    Hypertension    Low back pain    Pure hypercholesterolemia    Sleep apnea     Assessment/Plan: 1 Day Post-Op Procedure(s) (LRB): TOTAL HIP ARTHROPLASTY ANTERIOR APPROACH (Left) Principal Problem:   S/P total left hip arthroplasty  Estimated body mass index is 33.26 kg/m as calculated from the following:    Height as of this encounter: 6\' 2"  (1.88 m).   Weight as of this encounter: 117.5 kg. Advance diet Up with therapy D/C IV fluids  DVT Prophylaxis - Aspirin Weight bearing as tolerated.  Hgb stable at 11.8 this AM.  Plan is to go Home after hospital stay. Plan for discharge today after meeting goals with therapy. Follow up in the office in 2 weeks.   , PA-C Orthopedic Surgery [phone] 02/24/2022, 7:55 AM

## 2022-02-25 NOTE — Anesthesia Postprocedure Evaluation (Signed)
Anesthesia Post Note  Patient: Raymond Welch  Procedure(s) Performed: TOTAL HIP ARTHROPLASTY ANTERIOR APPROACH (Left: Hip)     Patient location during evaluation: PACU Anesthesia Type: General Level of consciousness: awake and alert Pain management: pain level controlled Vital Signs Assessment: post-procedure vital signs reviewed and stable Respiratory status: spontaneous breathing, nonlabored ventilation, respiratory function stable and patient connected to nasal cannula oxygen Cardiovascular status: blood pressure returned to baseline and stable Postop Assessment: no apparent nausea or vomiting Anesthetic complications: no   No notable events documented.  Last Vitals:  Vitals:   02/24/22 0554 02/24/22 0957  BP: (!) 142/84 120/77  Pulse: 78 76  Resp: 17 18  Temp: 36.8 C (!) 36.4 C  SpO2: 99% 95%    Last Pain:  Vitals:   02/24/22 1056  TempSrc:   PainSc: 0-No pain                 Midas Daughety S

## 2022-03-03 NOTE — Discharge Summary (Signed)
Patient ID: Raymond Welch MRN: [id number] DOB/AGE: 1950/11/20 71 y.o.  Admit date: 02/23/2022 Discharge date: 02/24/2022  Admission Diagnoses:  Left hip osteoarthritis  Discharge Diagnoses:  Principal Problem:   S/P total left hip arthroplasty   Past Medical History:  Diagnosis Date   Arthritis    Cardiomyopathy (HCC)    Diabetes mellitus without complication (HCC)    Hypertension    Low back pain    Pure hypercholesterolemia    Sleep apnea     Surgeries: Procedure(s): TOTAL HIP ARTHROPLASTY ANTERIOR APPROACH on 02/23/2022   Consultants:   Discharged Condition: Improved  Hospital Course: Addam Goeller is an 71 y.o. male who was admitted 02/23/2022 for operative treatment ofS/P total left hip arthroplasty. Patient has severe unremitting pain that affects sleep, daily activities, and work/hobbies. After pre-op clearance the patient was taken to the operating room on 02/23/2022 and underwent  Procedure(s): TOTAL HIP ARTHROPLASTY ANTERIOR APPROACH.    Patient was given perioperative antibiotics:  Anti-infectives (From admission, onward)    Start     Dose/Rate Route Frequency Ordered Stop   02/23/22 1700  ceFAZolin (ANCEF) IVPB 2g/100 mL premix        2 g 200 mL/hr over 30 Minutes Intravenous Every 6 hours 02/23/22 1423 02/24/22 0017   02/23/22 0900  ceFAZolin (ANCEF) IVPB 2g/100 mL premix        2 g 200 mL/hr over 30 Minutes Intravenous On call to O.R. 02/23/22 0851 02/23/22 1101        Patient was given sequential compression devices, early ambulation, and chemoprophylaxis to prevent DVT. Patient worked with PT and was meeting their goals regarding safe ambulation and transfers.  Patient benefited maximally from hospital stay and there were no complications.    Recent vital signs: No data found.   Recent laboratory studies: No results for input(s): "WBC", "HGB", "HCT", "PLT", "NA", "K", "CL", "CO2", "BUN", "CREATININE", "GLUCOSE", "INR", "CALCIUM" in the last 72  hours.  Invalid input(s): "PT", "2"   Discharge Medications:   Allergies as of 02/24/2022   No Known Allergies      Medication List     STOP taking these medications    diclofenac 75 MG EC tablet Commonly known as: VOLTAREN   oxyCODONE-acetaminophen 10-325 MG tablet Commonly known as: PERCOCET       TAKE these medications    acetaminophen 500 MG tablet Commonly known as: TYLENOL Take 2 tablets (1,000 mg total) by mouth every 6 (six) hours.   aspirin 81 MG chewable tablet Chew 1 tablet (81 mg total) by mouth 2 (two) times daily for 28 days.   atorvastatin 40 MG tablet Commonly known as: LIPITOR Take 1 tablet (40 mg total) by mouth daily.   docusate sodium 100 MG capsule Commonly known as: COLACE Take 1 capsule (100 mg total) by mouth 2 (two) times daily.   glucose blood test strip 1 each by Other route daily as needed for other (blood sugar). Use as instructed   losartan-hydrochlorothiazide 100-12.5 MG tablet Commonly known as: HYZAAR TAKE 1 TABLET BY MOUTH  DAILY   methocarbamol 500 MG tablet Commonly known as: ROBAXIN Take 1 tablet (500 mg total) by mouth every 6 (six) hours as needed for muscle spasms (muscle pain).   metoprolol succinate 50 MG 24 hr tablet Commonly known as: TOPROL-XL TAKE 1 TABLET BY MOUTH  DAILY WITH OR IMMEDIATLEY  FOLLOWING A MEAL   OneTouch Delica Lancets 33G Misc 1 each by Does not apply route daily as needed (blood sugar).  Oxycodone HCl 10 MG Tabs Take 1 tablet (10 mg total) by mouth every 4 (four) hours as needed for severe pain (pain score 7-10).   polyethylene glycol 17 g packet Commonly known as: MIRALAX / GLYCOLAX Take 17 g by mouth daily as needed for mild constipation.   Rybelsus 3 MG Tabs Generic drug: Semaglutide TAKE 1 TABLET BY MOUTH EVERY DAY   spironolactone 25 MG tablet Commonly known as: ALDACTONE Take 25 mg by mouth daily.   tadalafil 20 MG tablet Commonly known as: CIALIS TAKE 1 TABLET BY MOUTH  ONCE DAILY AS NEEDED FOR ERECTILE DYSFUNCTION               Discharge Care Instructions  (From admission, onward)           Start     Ordered   02/24/22 0000  Change dressing       Comments: Maintain surgical dressing until follow up in the clinic. If the edges start to pull up, may reinforce with tape. If the dressing is no longer working, may remove and cover with gauze and tape, but must keep the area dry and clean.  Call with any questions or concerns.   02/24/22 0806            Diagnostic Studies: DG Pelvis Portable  Result Date: 02/23/2022 CLINICAL DATA:  Postop left total hip arthroplasty. EXAM: PORTABLE PELVIS 1-2 VIEWS COMPARISON:  Pelvic radiographs 08/11/2017. Intraoperative radiographs earlier the same date. FINDINGS: 1312 hours. Single AP view of the pelvis demonstrates recent left total hip arthroplasty with a screw fixed acetabular component. The hardware appears well positioned. No evidence of acute fracture or dislocation. The right hip joint appears unremarkable. A small amount of soft tissue emphysema is noted lateral to the left hip. IMPRESSION: No evidence of complication following left total hip arthroplasty. Electronically Signed   By: Carey Bullocks M.D.   On: 02/23/2022 13:44   DG HIP UNILAT WITH PELVIS 1V LEFT  Result Date: 02/23/2022 CLINICAL DATA:  Left anterior hip replacement EXAM: DG HIP (WITH OR WITHOUT PELVIS) 1V*L* COMPARISON:  None Available. FINDINGS: Thirteen fluoroscopic images are obtained during the performance of the procedure and are provided for interpretation only. Images demonstrate left total hip arthroplasty, which appears in near anatomic alignment. Fluoroscopy time: 10 seconds 1.81 mGy IMPRESSION: Intraoperative imaging of left total hip arthroplasty. Electronically Signed   By: Wiliam Ke M.D.   On: 02/23/2022 12:35   DG C-Arm 1-60 Min-No Report  Result Date: 02/23/2022 Fluoroscopy was utilized by the requesting physician.   No radiographic interpretation.   DG C-Arm 1-60 Min-No Report  Result Date: 02/23/2022 Fluoroscopy was utilized by the requesting physician.  No radiographic interpretation.    Disposition: Discharge disposition: 01-Home or Self Care       Discharge Instructions     Call MD / Call 911   Complete by: As directed    If you experience chest pain or shortness of breath, CALL 911 and be transported to the hospital emergency room.  If you develope a fever above 101 F, pus (white drainage) or increased drainage or redness at the wound, or calf pain, call your surgeon's office.   Change dressing   Complete by: As directed    Maintain surgical dressing until follow up in the clinic. If the edges start to pull up, may reinforce with tape. If the dressing is no longer working, may remove and cover with gauze and tape, but must keep the area  dry and clean.  Call with any questions or concerns.   Constipation Prevention   Complete by: As directed    Drink plenty of fluids.  Prune juice may be helpful.  You may use a stool softener, such as Colace (over the counter) 100 mg twice a day.  Use MiraLax (over the counter) for constipation as needed.   Diet - low sodium heart healthy   Complete by: As directed    Increase activity slowly as tolerated   Complete by: As directed    Weight bearing as tolerated with assist device (walker, cane, etc) as directed, use it as long as suggested by your surgeon or therapist, typically at least 4-6 weeks.   Post-operative opioid taper instructions:   Complete by: As directed    POST-OPERATIVE OPIOID TAPER INSTRUCTIONS: It is important to wean off of your opioid medication as soon as possible. If you do not need pain medication after your surgery it is ok to stop day one. Opioids include: Codeine, Hydrocodone(Norco, Vicodin), Oxycodone(Percocet, oxycontin) and hydromorphone amongst others.  Long term and even short term use of opiods can cause: Increased pain  response Dependence Constipation Depression Respiratory depression And more.  Withdrawal symptoms can include Flu like symptoms Nausea, vomiting And more Techniques to manage these symptoms Hydrate well Eat regular healthy meals Stay active Use relaxation techniques(deep breathing, meditating, yoga) Do Not substitute Alcohol to help with tapering If you have been on opioids for less than two weeks and do not have pain than it is ok to stop all together.  Plan to wean off of opioids This plan should start within one week post op of your joint replacement. Maintain the same interval or time between taking each dose and first decrease the dose.  Cut the total daily intake of opioids by one tablet each day Next start to increase the time between doses. The last dose that should be eliminated is the evening dose.      TED hose   Complete by: As directed    Use stockings (TED hose) for 2 weeks on both leg(s).  You may remove them at night for sleeping.        Follow-up Information     Cassandria Anger, PA-C. Go on 03/10/2022.   Specialty: Orthopedic Surgery Why: You are scheduled for a follow up appointment on 03-10-22 at 10:30 am. Contact information: [address] [place] [address] [phone]                  Signed: Cassandria Anger 03/03/2022, 1:01 PM

## 2022-03-10 DIAGNOSIS — Z5189 Encounter for other specified aftercare: Secondary | ICD-10-CM | POA: Diagnosis not present

## 2022-03-18 DIAGNOSIS — M961 Postlaminectomy syndrome, not elsewhere classified: Secondary | ICD-10-CM | POA: Diagnosis not present

## 2022-03-18 DIAGNOSIS — M5416 Radiculopathy, lumbar region: Secondary | ICD-10-CM | POA: Diagnosis not present

## 2022-03-18 DIAGNOSIS — M25552 Pain in left hip: Secondary | ICD-10-CM | POA: Diagnosis not present

## 2022-03-24 ENCOUNTER — Other Ambulatory Visit: Payer: Self-pay

## 2022-03-24 ENCOUNTER — Telehealth: Payer: Self-pay

## 2022-03-24 MED ORDER — ATORVASTATIN CALCIUM 40 MG PO TABS: 40.0000 mg | ORAL_TABLET | Freq: Every day | ORAL | 3 refills | Status: DC

## 2022-03-24 NOTE — Telephone Encounter (Signed)
Patient called, patient aware. Provider would like for him to stop pravastatin and only take atorvastatin. Patient did need a refill, atorvastatin refill sent to local preferred pharmacy.

## 2022-03-29 DIAGNOSIS — E118 Type 2 diabetes mellitus with unspecified complications: Secondary | ICD-10-CM | POA: Diagnosis not present

## 2022-03-29 DIAGNOSIS — E785 Hyperlipidemia, unspecified: Secondary | ICD-10-CM | POA: Diagnosis not present

## 2022-03-29 LAB — HEPATIC FUNCTION PANEL
ALT: 10 IU/L (ref 0–44)
AST: 16 IU/L (ref 0–40)
Albumin: 4.3 g/dL (ref 3.8–4.8)
Alkaline Phosphatase: 102 IU/L (ref 44–121)
Bilirubin Total: 0.5 mg/dL (ref 0.0–1.2)
Bilirubin, Direct: 0.14 mg/dL (ref 0.00–0.40)
Total Protein: 7.7 g/dL (ref 6.0–8.5)

## 2022-03-29 LAB — LIPID PANEL
Chol/HDL Ratio: 3.1 ratio (ref 0.0–5.0)
Cholesterol, Total: 119 mg/dL (ref 100–199)
HDL: 39 mg/dL — ABNORMAL LOW (ref >39)
LDL Chol Calc (NIH): 59 mg/dL (ref 0–99)
Triglycerides: 112 mg/dL (ref 0–149)
VLDL Cholesterol Cal: 21 mg/dL (ref 5–40)

## 2022-04-01 ENCOUNTER — Encounter: Payer: Self-pay | Admitting: *Deleted

## 2022-04-12 DIAGNOSIS — Z471 Aftercare following joint replacement surgery: Secondary | ICD-10-CM | POA: Diagnosis not present

## 2022-04-12 DIAGNOSIS — Z96642 Presence of left artificial hip joint: Secondary | ICD-10-CM | POA: Diagnosis not present

## 2022-04-19 ENCOUNTER — Other Ambulatory Visit: Payer: Self-pay | Admitting: Internal Medicine

## 2022-05-25 ENCOUNTER — Ambulatory Visit (INDEPENDENT_AMBULATORY_CARE_PROVIDER_SITE_OTHER): Payer: Medicare Other | Admitting: Internal Medicine

## 2022-05-25 ENCOUNTER — Other Ambulatory Visit: Payer: Self-pay | Admitting: Internal Medicine

## 2022-05-25 ENCOUNTER — Encounter: Payer: Self-pay | Admitting: Internal Medicine

## 2022-05-25 VITALS — BP 132/80 | HR 78 | Temp 97.8°F | Ht 74.0 in | Wt 254.4 lb

## 2022-05-25 DIAGNOSIS — N182 Chronic kidney disease, stage 2 (mild): Secondary | ICD-10-CM | POA: Diagnosis not present

## 2022-05-25 DIAGNOSIS — I129 Hypertensive chronic kidney disease with stage 1 through stage 4 chronic kidney disease, or unspecified chronic kidney disease: Secondary | ICD-10-CM | POA: Diagnosis not present

## 2022-05-25 DIAGNOSIS — M79669 Pain in unspecified lower leg: Secondary | ICD-10-CM | POA: Diagnosis not present

## 2022-05-25 DIAGNOSIS — Z2821 Immunization not carried out because of patient refusal: Secondary | ICD-10-CM | POA: Diagnosis not present

## 2022-05-25 DIAGNOSIS — E6609 Other obesity due to excess calories: Secondary | ICD-10-CM

## 2022-05-25 DIAGNOSIS — R7303 Prediabetes: Secondary | ICD-10-CM

## 2022-05-25 DIAGNOSIS — Z6832 Body mass index (BMI) 32.0-32.9, adult: Secondary | ICD-10-CM

## 2022-05-25 MED ORDER — RYBELSUS 3 MG PO TABS
1.0000 | ORAL_TABLET | Freq: Every day | ORAL | 3 refills | Status: DC
Start: 2022-05-25 — End: 2023-09-05

## 2022-05-25 NOTE — Patient Instructions (Signed)
Hypertension, Adult ?Hypertension is another name for high blood pressure. High blood pressure forces your heart to work harder to pump blood. This can cause problems over time. ?There are two numbers in a blood pressure reading. There is a top number (systolic) over a bottom number (diastolic). It is best to have a blood pressure that is below 120/80. ?What are the causes? ?The cause of this condition is not known. Some other conditions can lead to high blood pressure. ?What increases the risk? ?Some lifestyle factors can make you more likely to develop high blood pressure: ?Smoking. ?Not getting enough exercise or physical activity. ?Being overweight. ?Having too much fat, sugar, calories, or salt (sodium) in your diet. ?Drinking too much alcohol. ?Other risk factors include: ?Having any of these conditions: ?Heart disease. ?Diabetes. ?High cholesterol. ?Kidney disease. ?Obstructive sleep apnea. ?Having a family history of high blood pressure and high cholesterol. ?Age. The risk increases with age. ?Stress. ?What are the signs or symptoms? ?High blood pressure may not cause symptoms. Very high blood pressure (hypertensive crisis) may cause: ?Headache. ?Fast or uneven heartbeats (palpitations). ?Shortness of breath. ?Nosebleed. ?Vomiting or feeling like you may vomit (nauseous). ?Changes in how you see. ?Very bad chest pain. ?Feeling dizzy. ?Seizures. ?How is this treated? ?This condition is treated by making healthy lifestyle changes, such as: ?Eating healthy foods. ?Exercising more. ?Drinking less alcohol. ?Your doctor may prescribe medicine if lifestyle changes do not help enough and if: ?Your top number is above 130. ?Your bottom number is above 80. ?Your personal target blood pressure may vary. ?Follow these instructions at home: ?Eating and drinking ? ?If told, follow the DASH eating plan. To follow this plan: ?Fill one half of your plate at each meal with fruits and vegetables. ?Fill one fourth of your plate  at each meal with whole grains. Whole grains include whole-wheat pasta, brown rice, and whole-grain bread. ?Eat or drink low-fat dairy products, such as skim milk or low-fat yogurt. ?Fill one fourth of your plate at each meal with low-fat (lean) proteins. Low-fat proteins include fish, chicken without skin, eggs, beans, and tofu. ?Avoid fatty meat, cured and processed meat, or chicken with skin. ?Avoid pre-made or processed food. ?Limit the amount of salt in your diet to less than 1,500 mg each day. ?Do not drink alcohol if: ?Your doctor tells you not to drink. ?You are pregnant, may be pregnant, or are planning to become pregnant. ?If you drink alcohol: ?Limit how much you have to: ?0-1 drink a day for women. ?0-2 drinks a day for men. ?Know how much alcohol is in your drink. In the [place] one drink equals one 12 oz bottle of beer (355 mL), one 5 oz glass of wine (148 mL), or one 1? oz glass of hard liquor (44 mL). ?Lifestyle ? ?Work with your doctor to stay at a healthy weight or to lose weight. Ask your doctor what the best weight is for you. ?Get at least 30 minutes of exercise that causes your heart to beat faster (aerobic exercise) most days of the week. This may include walking, swimming, or biking. ?Get at least 30 minutes of exercise that strengthens your muscles (resistance exercise) at least 3 days a week. This may include lifting weights or doing Pilates. ?Do not smoke or use any products that contain nicotine or tobacco. If you need help quitting, ask your doctor. ?Check your blood pressure at home as told by your doctor. ?Keep all follow-up visits. ?Medicines ?Take over-the-counter and prescription medicines   only as told by your doctor. Follow directions carefully. ?Do not skip doses of blood pressure medicine. The medicine does not work as well if you skip doses. Skipping doses also puts you at risk for problems. ?Ask your doctor about side effects or reactions to medicines that you should watch  for. ?Contact a doctor if: ?You think you are having a reaction to the medicine you are taking. ?You have headaches that keep coming back. ?You feel dizzy. ?You have swelling in your ankles. ?You have trouble with your vision. ?Get help right away if: ?You get a very bad headache. ?You start to feel mixed up (confused). ?You feel weak or numb. ?You feel faint. ?You have very bad pain in your: ?Chest. ?Belly (abdomen). ?You vomit more than once. ?You have trouble breathing. ?These symptoms may be an emergency. Get help right away. Call 911. ?Do not wait to see if the symptoms will go away. ?Do not drive yourself to the hospital. ?Summary ?Hypertension is another name for high blood pressure. ?High blood pressure forces your heart to work harder to pump blood. ?For most people, a normal blood pressure is less than 120/80. ?Making healthy choices can help lower blood pressure. If your blood pressure does not get lower with healthy choices, you may need to take medicine. ?This information is not intended to replace advice given to you by your health care provider. Make sure you discuss any questions you have with your health care provider. ?Document Revised: 05/07/2021 Document Reviewed: 05/07/2021 ?Elsevier Patient Education ? 2023 [employer] ? ?

## 2022-05-25 NOTE — Progress Notes (Signed)
[name],acting as a Education administrator for [name], MD.,have documented all relevant documentation on the behalf of [name], MD,as directed by  [name], MD while in the presence of [name], MD.    Subjective:     Patient ID: Raymond Welch , male    DOB: Feb 12, 1951 , 71 y.o.   MRN: [id number]   Chief Complaint  Patient presents with   Hypertension   Prediabetes    HPI  The patient is here to day for a follow-up on his bp.  He reports compliance with meds. He denies headaches, chest pain and shortness of breath. Patient reports he has been having a lot of shin bone pain.   Hypertension This is a chronic problem. The current episode started more than 1 year ago. The problem has been gradually improving since onset. The problem is controlled. Past treatments include angiotensin blockers and diuretics. The current treatment provides moderate improvement. Compliance problems include exercise.  Hypertensive end-organ damage includes kidney disease. There is no history of hyperaldosteronism, hypercortisolism or sleep apnea.     Past Medical History:  Diagnosis Date   Arthritis    Cardiomyopathy ([place])    Diabetes mellitus without complication ([place])    Hypertension    Low back pain    Pure hypercholesterolemia    Sleep apnea      Family History  Problem Relation Age of Onset   Diabetes Mother    Heart attack Mother 62       Died age 67   Aneurysm Father 65   Lung cancer Brother      Current Outpatient Medications:    acetaminophen (TYLENOL) 500 MG tablet, Take 2 tablets (1,000 mg total) by mouth every 6 (six) hours., Disp: 30 tablet, Rfl: 0   atorvastatin (LIPITOR) 40 MG tablet, Take 1 tablet (40 mg total) by mouth daily., Disp: 90 tablet, Rfl: 3   docusate sodium (COLACE) 100 MG capsule, Take 1 capsule (100 mg total) by mouth 2 (two) times daily., Disp: 10 capsule, Rfl: 0   glucose blood test strip, 1 each by Other route daily as needed for other  (blood sugar). Use as instructed, Disp: , Rfl:    methocarbamol (ROBAXIN) 500 MG tablet, Take 1 tablet (500 mg total) by mouth every 6 (six) hours as needed for muscle spasms (muscle pain)., Disp: 40 tablet, Rfl: 0   metoprolol succinate (TOPROL-XL) 50 MG 24 hr tablet, TAKE 1 TABLET BY MOUTH  DAILY WITH OR IMMEDIATLEY  FOLLOWING A MEAL, Disp: 90 tablet, Rfl: 3   ONETOUCH DELICA LANCETS 73U MISC, 1 each by Does not apply route daily as needed (blood sugar). , Disp: , Rfl:    oxyCODONE 10 MG TABS, Take 1 tablet (10 mg total) by mouth every 4 (four) hours as needed for severe pain (pain score 7-10)., Disp: 42 tablet, Rfl: 0   polyethylene glycol (MIRALAX / GLYCOLAX) 17 g packet, Take 17 g by mouth daily as needed for mild constipation., Disp: 14 each, Rfl: 0   RYBELSUS 3 MG TABS, TAKE 1 TABLET BY MOUTH EVERY DAY, Disp: 30 tablet, Rfl: 3   spironolactone (ALDACTONE) 25 MG tablet, Take 25 mg by mouth daily., Disp: , Rfl:    tadalafil (CIALIS) 20 MG tablet, TAKE 1 TABLET BY MOUTH ONCE DAILY AS NEEDED FOR ERECTILE DYSFUNCTION, Disp: 30 tablet, Rfl: 0   losartan-hydrochlorothiazide (HYZAAR) 100-12.5 MG tablet, TAKE 1 TABLET BY MOUTH  DAILY, Disp: 90 tablet, Rfl: 3  No Known Allergies   Review of Systems  Constitutional: Negative.   Eyes: Negative.   Musculoskeletal: Negative.   Skin: Negative.   Psychiatric/Behavioral: Negative.       Today's Vitals   05/25/22 0927  BP: 132/80  Pulse: 78  Temp: 97.8 F (36.6 C)  Weight: 254 lb 6.4 oz (115.4 kg)  Height: '6\' 2"'  (1.88 m)  PainSc: 8   PainLoc: Leg   Body mass index is 32.66 kg/m.  Wt Readings from Last 3 Encounters:  05/25/22 254 lb 6.4 oz (115.4 kg)  02/23/22 259 lb 0.7 oz (117.5 kg)  02/10/22 259 lb (117.5 kg)     Objective:  Physical Exam Vitals and nursing note reviewed.  Constitutional:      Appearance: Normal appearance.  HENT:     Head: Normocephalic and atraumatic.     Nose:     Comments: Masked     Mouth/Throat:      Comments: Masked  Eyes:     Extraocular Movements: Extraocular movements intact.  Cardiovascular:     Rate and Rhythm: Normal rate and regular rhythm.     Heart sounds: Normal heart sounds.  Pulmonary:     Effort: No respiratory distress.     Breath sounds: Normal breath sounds.  Musculoskeletal:     Cervical back: Normal range of motion.  Skin:    General: Skin is warm.  Neurological:     General: No focal deficit present.     Mental Status: He is alert.  Psychiatric:        Mood and Affect: Mood normal.      Assessment And Plan:     1. 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 Goal BP 130/80. Advised to incorporate more exercise into his daily routine.  - CBC no Diff - BMP8+EGFR - Amb Referral To Provider Referral Exercise Program (P.R.E.P)  2. Chronic renal disease, stage II Comments: Chronic, he is encouraged to avoid NSAIDs and to stay well hydrated.   3. Pre-diabetes Comments: His a1c has been elevated in the past. I will recheck an insulin level and a1c today. - Hemoglobin A1c - BMP8+EGFR - Amb Referral To Provider Referral Exercise Program (P.R.E.P)  4. Pain in shin, unspecified laterality Comments: I will check vitamin D level and supplement as needed.  - Vitamin D (25 hydroxy)  5. Class 1 obesity due to excess calories with serious comorbidity and body mass index (BMI) of 32.0 to 32.9 in adult Comments: He was congratulated on his five lb weight loss since July 2023. He admits he is not exercising regularly. I will refer him to PREP.  - Amb Referral To Provider Referral Exercise Program (P.R.E.P)  6. Herpes zoster vaccination declined  7. Influenza vaccination declined   Patient was given opportunity to ask questions. Patient verbalized understanding of the plan and was able to repeat key elements of the plan. All questions were answered to their satisfaction.   I, [name], MD, have reviewed all documentation for this visit. The documentation on 05/25/22 for the exam, diagnosis, procedures, and orders are all accurate and complete.   IF YOU HAVE BEEN REFERRED TO A SPECIALIST, IT MAY TAKE 1-2 WEEKS TO SCHEDULE/PROCESS THE REFERRAL. IF YOU HAVE NOT HEARD FROM US/SPECIALIST IN TWO WEEKS, PLEASE GIVE [place] A CALL AT [phone].   THE PATIENT IS ENCOURAGED TO PRACTICE SOCIAL DISTANCING DUE TO THE COVID-19 PANDEMIC.

## 2022-05-26 LAB — CBC
Hematocrit: 38.9 % (ref 37.5–51.0)
Hemoglobin: 12.4 g/dL — ABNORMAL LOW (ref 13.0–17.7)
MCH: 27.9 pg (ref 26.6–33.0)
MCHC: 31.9 g/dL (ref 31.5–35.7)
MCV: 88 fL (ref 79–97)
Platelets: 211 10*3/uL (ref 150–450)
RBC: 4.44 x10E6/uL (ref 4.14–5.80)
RDW: 12.2 % (ref 11.6–15.4)
WBC: 4.6 10*3/uL (ref 3.4–10.8)

## 2022-05-26 LAB — BMP8+EGFR
BUN/Creatinine Ratio: 12 (ref 10–24)
BUN: 15 mg/dL (ref 8–27)
CO2: 24 mmol/L (ref 20–29)
Calcium: 10 mg/dL (ref 8.6–10.2)
Chloride: 102 mmol/L (ref 96–106)
Creatinine, Ser: 1.26 mg/dL (ref 0.76–1.27)
Glucose: 92 mg/dL (ref 70–99)
Potassium: 4.1 mmol/L (ref 3.5–5.2)
Sodium: 141 mmol/L (ref 134–144)
eGFR: 61 mL/min/{1.73_m2} (ref >59)

## 2022-05-26 LAB — HEMOGLOBIN A1C
Est. average glucose Bld gHb Est-mCnc: 111 mg/dL
Hgb A1c MFr Bld: 5.5 % (ref 4.8–5.6)

## 2022-05-26 LAB — VITAMIN D 25 HYDROXY (VIT D DEFICIENCY, FRACTURES): Vit D, 25-Hydroxy: 20.1 ng/mL — ABNORMAL LOW (ref 30.0–100.0)

## 2022-06-17 DIAGNOSIS — M5416 Radiculopathy, lumbar region: Secondary | ICD-10-CM | POA: Diagnosis not present

## 2022-06-17 DIAGNOSIS — M542 Cervicalgia: Secondary | ICD-10-CM | POA: Diagnosis not present

## 2022-06-17 DIAGNOSIS — M25552 Pain in left hip: Secondary | ICD-10-CM | POA: Diagnosis not present

## 2022-06-17 DIAGNOSIS — M961 Postlaminectomy syndrome, not elsewhere classified: Secondary | ICD-10-CM | POA: Diagnosis not present

## 2022-07-12 ENCOUNTER — Other Ambulatory Visit: Payer: Self-pay | Admitting: Internal Medicine

## 2022-08-06 ENCOUNTER — Telehealth: Payer: Self-pay

## 2022-08-06 NOTE — Telephone Encounter (Signed)
Call to pt reference next PREP class starting 08/31/22 at [facility] T/TH 1p-215p Confirmed schedule. Intake scheduled for 08/25/22 at 230p

## 2022-08-21 ENCOUNTER — Other Ambulatory Visit: Payer: Self-pay | Admitting: Internal Medicine

## 2022-08-27 ENCOUNTER — Telehealth: Payer: Self-pay

## 2022-08-27 NOTE — Telephone Encounter (Signed)
Patient did not show for 08/25/24 or 08/27/24 intake appts for PREP. Called pt today. Expressed that he didn't want to do anything that would mess up his back. Upon further questioning, he said he would rather not attend PREP.  Sent referring provider a message.

## 2022-09-08 ENCOUNTER — Telehealth: Payer: Self-pay

## 2022-09-08 DIAGNOSIS — H40053 Ocular hypertension, bilateral: Secondary | ICD-10-CM | POA: Diagnosis not present

## 2022-09-08 DIAGNOSIS — E119 Type 2 diabetes mellitus without complications: Secondary | ICD-10-CM | POA: Diagnosis not present

## 2022-09-08 DIAGNOSIS — H10413 Chronic giant papillary conjunctivitis, bilateral: Secondary | ICD-10-CM | POA: Diagnosis not present

## 2022-09-08 DIAGNOSIS — H16223 Keratoconjunctivitis sicca, not specified as Sjogren's, bilateral: Secondary | ICD-10-CM | POA: Diagnosis not present

## 2022-09-08 DIAGNOSIS — Z794 Long term (current) use of insulin: Secondary | ICD-10-CM | POA: Diagnosis not present

## 2022-09-08 DIAGNOSIS — H25813 Combined forms of age-related cataract, bilateral: Secondary | ICD-10-CM | POA: Diagnosis not present

## 2022-09-12 ENCOUNTER — Other Ambulatory Visit: Payer: Self-pay | Admitting: Cardiology

## 2022-09-14 DIAGNOSIS — M542 Cervicalgia: Secondary | ICD-10-CM | POA: Diagnosis not present

## 2022-09-14 DIAGNOSIS — M961 Postlaminectomy syndrome, not elsewhere classified: Secondary | ICD-10-CM | POA: Diagnosis not present

## 2022-10-21 ENCOUNTER — Encounter: Payer: Self-pay | Admitting: Internal Medicine

## 2022-10-21 ENCOUNTER — Ambulatory Visit (INDEPENDENT_AMBULATORY_CARE_PROVIDER_SITE_OTHER): Payer: 59 | Admitting: Internal Medicine

## 2022-10-21 VITALS — BP 130/84 | HR 71 | Temp 97.5°F | Ht 74.0 in | Wt 257.4 lb

## 2022-10-21 DIAGNOSIS — N182 Chronic kidney disease, stage 2 (mild): Secondary | ICD-10-CM | POA: Diagnosis not present

## 2022-10-21 DIAGNOSIS — Z87891 Personal history of nicotine dependence: Secondary | ICD-10-CM

## 2022-10-21 DIAGNOSIS — I129 Hypertensive chronic kidney disease with stage 1 through stage 4 chronic kidney disease, or unspecified chronic kidney disease: Secondary | ICD-10-CM | POA: Diagnosis not present

## 2022-10-21 DIAGNOSIS — I420 Dilated cardiomyopathy: Secondary | ICD-10-CM

## 2022-10-21 DIAGNOSIS — Z23 Encounter for immunization: Secondary | ICD-10-CM | POA: Diagnosis not present

## 2022-10-21 DIAGNOSIS — I509 Heart failure, unspecified: Secondary | ICD-10-CM

## 2022-10-21 DIAGNOSIS — I130 Hypertensive heart and chronic kidney disease with heart failure and stage 1 through stage 4 chronic kidney disease, or unspecified chronic kidney disease: Secondary | ICD-10-CM | POA: Diagnosis not present

## 2022-10-21 DIAGNOSIS — Z6833 Body mass index (BMI) 33.0-33.9, adult: Secondary | ICD-10-CM

## 2022-10-21 DIAGNOSIS — R7303 Prediabetes: Secondary | ICD-10-CM

## 2022-10-21 DIAGNOSIS — E6609 Other obesity due to excess calories: Secondary | ICD-10-CM

## 2022-10-21 DIAGNOSIS — Z0001 Encounter for general adult medical examination with abnormal findings: Secondary | ICD-10-CM | POA: Diagnosis not present

## 2022-10-21 DIAGNOSIS — Z0000 Encounter for general adult medical examination without abnormal findings: Secondary | ICD-10-CM

## 2022-10-21 DIAGNOSIS — Z2821 Immunization not carried out because of patient refusal: Secondary | ICD-10-CM

## 2022-10-21 LAB — POCT URINALYSIS DIPSTICK
Bilirubin, UA: NEGATIVE
Blood, UA: NEGATIVE
Glucose, UA: NEGATIVE
Ketones, UA: NEGATIVE
Leukocytes, UA: NEGATIVE
Nitrite, UA: NEGATIVE
Protein, UA: NEGATIVE
Spec Grav, UA: 1.02 (ref 1.010–1.025)
Urobilinogen, UA: 0.2 E.U./dL
pH, UA: 6 (ref 5.0–8.0)

## 2022-10-21 NOTE — Patient Instructions (Signed)
Health Maintenance, Male Adopting a healthy lifestyle and getting preventive care are important in promoting health and wellness. Ask your health care provider about: The right schedule for you to have regular tests and exams. Things you can do on your own to prevent diseases and keep yourself healthy. What should I know about diet, weight, and exercise? Eat a healthy diet  Eat a diet that includes plenty of vegetables, fruits, low-fat dairy products, and lean protein. Do not eat a lot of foods that are high in solid fats, added sugars, or sodium. Maintain a healthy weight Body mass index (BMI) is a measurement that can be used to identify possible weight problems. It estimates body fat based on height and weight. Your health care provider can help determine your BMI and help you achieve or maintain a healthy weight. Get regular exercise Get regular exercise. This is one of the most important things you can do for your health. Most adults should: Exercise for at least 150 minutes each week. The exercise should increase your heart rate and make you sweat (moderate-intensity exercise). Do strengthening exercises at least twice a week. This is in addition to the moderate-intensity exercise. Spend less time sitting. Even light physical activity can be beneficial. Watch cholesterol and blood lipids Have your blood tested for lipids and cholesterol at 73 years of age, then have this test every 5 years. You may need to have your cholesterol levels checked more often if: Your lipid or cholesterol levels are high. You are older than 73 years of age. You are at high risk for heart disease. What should I know about cancer screening? Many types of cancers can be detected early and may often be prevented. Depending on your health history and family history, you may need to have cancer screening at various ages. This may include screening for: Colorectal cancer. Prostate cancer. Skin cancer. Lung  cancer. What should I know about heart disease, diabetes, and high blood pressure? Blood pressure and heart disease High blood pressure causes heart disease and increases the risk of stroke. This is more likely to develop in people who have high blood pressure readings or are overweight. Talk with your health care provider about your target blood pressure readings. Have your blood pressure checked: Every 3-5 years if you are 18-39 years of age. Every year if you are 40 years old or older. If you are between the ages of 65 and 75 and are a current or former smoker, ask your health care provider if you should have a one-time screening for abdominal aortic aneurysm (AAA). Diabetes Have regular diabetes screenings. This checks your fasting blood sugar level. Have the screening done: Once every three years after age 45 if you are at a normal weight and have a low risk for diabetes. More often and at a younger age if you are overweight or have a high risk for diabetes. What should I know about preventing infection? Hepatitis B If you have a higher risk for hepatitis B, you should be screened for this virus. Talk with your health care provider to find out if you are at risk for hepatitis B infection. Hepatitis C Blood testing is recommended for: Everyone born from 1945 through 1965. Anyone with known risk factors for hepatitis C. Sexually transmitted infections (STIs) You should be screened each year for STIs, including gonorrhea and chlamydia, if: You are sexually active and are younger than 73 years of age. You are older than 73 years of age and your   health care provider tells you that you are at risk for this type of infection. Your sexual activity has changed since you were last screened, and you are at increased risk for chlamydia or gonorrhea. Ask your health care provider if you are at risk. Ask your health care provider about whether you are at high risk for HIV. Your health care provider  may recommend a prescription medicine to help prevent HIV infection. If you choose to take medicine to prevent HIV, you should first get tested for HIV. You should then be tested every 3 months for as long as you are taking the medicine. Follow these instructions at home: Alcohol use Do not drink alcohol if your health care provider tells you not to drink. If you drink alcohol: Limit how much you have to 0-2 drinks a day. Know how much alcohol is in your drink. In the [place] one drink equals one 12 oz bottle of beer (355 mL), one 5 oz glass of wine (148 mL), or one 1 oz glass of hard liquor (44 mL). Lifestyle Do not use any products that contain nicotine or tobacco. These products include cigarettes, chewing tobacco, and vaping devices, such as e-cigarettes. If you need help quitting, ask your health care provider. Do not use street drugs. Do not share needles. Ask your health care provider for help if you need support or information about quitting drugs. General instructions Schedule regular health, dental, and eye exams. Stay current with your vaccines. Tell your health care provider if: You often feel depressed. You have ever been abused or do not feel safe at home. Summary Adopting a healthy lifestyle and getting preventive care are important in promoting health and wellness. Follow your health care provider's instructions about healthy diet, exercising, and getting tested or screened for diseases. Follow your health care provider's instructions on monitoring your cholesterol and blood pressure. This information is not intended to replace advice given to you by your health care provider. Make sure you discuss any questions you have with your health care provider. Document Revised: 12/08/2020 Document Reviewed: 12/08/2020 Elsevier Patient Education  2023 Elsevier Inc.  

## 2022-10-21 NOTE — Progress Notes (Signed)
I,Raymond Welch,acting as a scribe for Raymond Aliment, MD.,have documented all relevant documentation on the behalf of Raymond Aliment, MD,as directed by  Raymond Aliment, MD while in the presence of Raymond Aliment, MD.   Subjective:     Patient ID: Raymond Welch , male    DOB: [birth year] , 72 y.o.   MRN: [id number]   Chief Complaint  Patient presents with   Annual Exam   Hypertension   Prediabetes    HPI  Patient presents today for annual exam and BP check. He reports compliance with meds. Denies headache, chest pain, SOB.  He states he is UTD w/ his eye exams.     Hypertension This is a chronic problem. The current episode started more than 1 year ago. The problem has been gradually improving since onset. The problem is controlled. Pertinent negatives include no palpitations or shortness of breath. Past treatments include angiotensin blockers and diuretics. The current treatment provides moderate improvement. Compliance problems include exercise.  Hypertensive end-organ damage includes kidney disease. There is no history of hyperaldosteronism, hypercortisolism or sleep apnea.     Past Medical History:  Diagnosis Date   Arthritis    Cardiomyopathy (HCC)    Diabetes mellitus without complication (HCC)    Hypertension    Low back pain    Pure hypercholesterolemia    Sleep apnea      Family History  Problem Relation Age of Onset   Diabetes Mother    Heart attack Mother 34       Died age 42   Aneurysm Father 27   Lung cancer Brother      Current Outpatient Medications:    acetaminophen (TYLENOL) 500 MG tablet, Take 2 tablets (1,000 mg total) by mouth every 6 (six) hours., Disp: 30 tablet, Rfl: 0   atorvastatin (LIPITOR) 40 MG tablet, Take 1 tablet (40 mg total) by mouth daily., Disp: 90 tablet, Rfl: 3   docusate sodium (COLACE) 100 MG capsule, Take 1 capsule (100 mg total) by mouth 2 (two) times daily., Disp: 10 capsule, Rfl: 0   glucose blood test strip, 1 each by  Other route daily as needed for other (blood sugar). Use as instructed, Disp: , Rfl:    losartan-hydrochlorothiazide (HYZAAR) 100-12.5 MG tablet, TAKE 1 TABLET BY MOUTH DAILY, Disp: 100 tablet, Rfl: 2   methocarbamol (ROBAXIN) 500 MG tablet, Take 1 tablet (500 mg total) by mouth every 6 (six) hours as needed for muscle spasms (muscle pain)., Disp: 40 tablet, Rfl: 0   metoprolol succinate (TOPROL-XL) 50 MG 24 hr tablet, TAKE 1 TABLET BY MOUTH DAILY  WITH OR IMMEDIATELY FOLLOWING A  MEAL, Disp: 100 tablet, Rfl: 2   ONETOUCH DELICA LANCETS 33G MISC, 1 each by Does not apply route daily as needed (blood sugar). , Disp: , Rfl:    oxyCODONE 10 MG TABS, Take 1 tablet (10 mg total) by mouth every 4 (four) hours as needed for severe pain (pain score 7-10)., Disp: 42 tablet, Rfl: 0   polyethylene glycol (MIRALAX / GLYCOLAX) 17 g packet, Take 17 g by mouth daily as needed for mild constipation., Disp: 14 each, Rfl: 0   Semaglutide (RYBELSUS) 3 MG TABS, Take 1 tablet by mouth daily., Disp: 90 tablet, Rfl: 3   spironolactone (ALDACTONE) 25 MG tablet, TAKE 1 TABLET (25 MG TOTAL) BY MOUTH DAILY., Disp: 90 tablet, Rfl: 1   tadalafil (CIALIS) 20 MG tablet, TAKE 1 TABLET BY MOUTH ONCE DAILY AS NEEDED FOR ERECTILE  DYSFUNCTION, Disp: 30 tablet, Rfl: 0   No Known Allergies   Men's preventive visit. Patient Health Questionnaire (PHQ-2) is  Flowsheet Row [facility]  PHQ-2 Total Score 0     . Patient is on a heart healthy diet. Marital status: Married. Relevant history for alcohol use is:  Social History   Substance and Sexual Activity  Alcohol Use No  . Relevant history for tobacco use is:  Social History   Tobacco Use  Smoking Status Former   Packs/day: 0.75   Years: 40.00   Additional pack years: 0.00   Total pack years: 30.00   Types: Cigarettes   Quit date: 2010   Years since quitting: 14.2  Smokeless Tobacco Never  Tobacco Comments    Quit 8 years ago.   .   Review of Systems  Constitutional: Negative.   HENT: Negative.    Eyes: Negative.   Respiratory: Negative.  Negative for shortness of breath.   Cardiovascular: Negative.  Negative for palpitations.  Endocrine: Negative.   Genitourinary: Negative.   Musculoskeletal: Negative.   Skin: Negative.   Allergic/Immunologic: Negative.   Neurological: Negative.   Hematological: Negative.   Psychiatric/Behavioral: Negative.       Today's Vitals   10/21/22 1055  BP: 130/84  Pulse: 71  Temp: (!) 97.5 F (36.4 C)  SpO2: 98%  Weight: 257 lb 6.4 oz (116.8 kg)  Height: 6\' 2"  (1.88 m)   Body mass index is 33.05 kg/m.  Wt Readings from Last 3 Encounters:  10/21/22 257 lb 6.4 oz (116.8 kg)  05/25/22 254 lb 6.4 oz (115.4 kg)  02/23/22 259 lb 0.7 oz (117.5 kg)    Objective:  Physical Exam Vitals and nursing note reviewed.  Constitutional:      Appearance: Normal appearance.  HENT:     Head: Normocephalic and atraumatic.     Right Ear: Tympanic membrane, ear canal and external ear normal.     Left Ear: Tympanic membrane, ear canal and external ear normal.     Nose:     Comments: Masked     Mouth/Throat:     Comments: Masked  Eyes:     Extraocular Movements: Extraocular movements intact.     Conjunctiva/sclera: Conjunctivae normal.     Pupils: Pupils are equal, round, and reactive to light.  Cardiovascular:     Rate and Rhythm: Normal rate and regular rhythm.     Pulses: Normal pulses.     Heart sounds: Normal heart sounds.  Pulmonary:     Effort: Pulmonary effort is normal.     Breath sounds: Normal breath sounds.  Chest:  Breasts:    Right: Normal. No swelling, bleeding, inverted nipple, mass or nipple discharge.     Left: Normal. No swelling, bleeding, inverted nipple, mass or nipple discharge.  Abdominal:     General: Bowel sounds are normal.     Palpations: Abdomen is soft.     Comments: Obese, rounded, soft  Genitourinary:    Comments:  Masked  Musculoskeletal:        General: Normal range of motion.     Cervical back: Normal range of motion and neck supple.  Feet:     Right foot:     Skin integrity: Dry skin present.     Toenail Condition: Right toenails are abnormally thick.     Left foot:     Skin integrity: Dry skin present.     Toenail Condition: Left  toenails are abnormally thick.  Skin:    General: Skin is warm.  Neurological:     General: No focal deficit present.     Mental Status: He is alert.  Psychiatric:        Mood and Affect: Mood normal.        Behavior: Behavior normal.         Assessment And Plan:    1. Encounter for annual health examination Comments: A full exam was performed. DRE deferred, per patient request.  PATIENT IS ADVISED TO GET 30-45 MINUTES REGULAR EXERCISE NO LESS THAN FOUR TO FIVE DAYS PER WEEK - BOTH WEIGHTBEARING EXERCISES AND AEROBIC ARE RECOMMENDED.  PATIENT IS ADVISED TO FOLLOW A HEALTHY DIET WITH AT LEAST SIX FRUITS/VEGGIES PER DAY, DECREASE INTAKE OF RED MEAT, AND TO INCREASE FISH INTAKE TO TWO DAYS PER WEEK.  MEATS/FISH SHOULD NOT BE FRIED, BAKED OR BROILED IS PREFERABLE.  IT IS ALSO IMPORTANT TO CUT BACK ON YOUR SUGAR INTAKE. PLEASE AVOID ANYTHING WITH ADDED SUGAR, CORN SYRUP OR OTHER SWEETENERS. IF YOU MUST USE A SWEETENER, YOU CAN TRY STEVIA. IT IS ALSO IMPORTANT TO AVOID ARTIFICIALLY SWEETENERS AND DIET BEVERAGES. LASTLY, I SUGGEST WEARING SPF 50 SUNSCREEN ON EXPOSED PARTS AND ESPECIALLY WHEN IN THE DIRECT SUNLIGHT FOR AN EXTENDED PERIOD OF TIME.  PLEASE AVOID FAST FOOD RESTAURANTS AND INCREASE YOUR WATER INTAKE.   2. Hypertensive heart and renal disease with renal failure, stage 1 through stage 4 or unspecified chronic kidney disease, with heart failure (HCC) Comments: Chronic, fair control. He will c/w losartan/hct 100/12.5mg , metoprolol XL 50mg  and spironolactone daily. EKG performed, NSR w/o acute changes. I will check labs as below. He will rto in 4-6 months for  re-evaluation. He is encouraged to incorporate more exercise into his daily routine.  - POCT Urinalysis Dipstick (81002) - Microalbumin / creatinine urine ratio - EKG 12-Lead - CMP14+EGFR  3. Chronic renal disease, stage II Comments: Chronic, he is encouraged to limit NSAID use, stay well hydrated and keep BP well controlled to decrease risk of CKD progression. - PSA  4. Dilated cardiomyopathy (HCC) Comments: Most recent Cardiology note reviewed. He will c/w spironolactone and BBlocker therapy. Importance of dietary/medication compliance was stressed to the patient.  5. Pre-diabetes Comments: Previous labs reviewed, his a1c has been elevated in the past. I will recheck an a1c today. He is encouraged to decrease intake of sugary foods/drinks. - Hemoglobin A1c  6. Class 1 obesity due to excess calories with serious comorbidity and body mass index (BMI) of 33.0 to 33.9 in adult Comments: She is encouraged to strive for BMI less than 30 to decrease cardiac risk. Advised to aim for at least 150 minutes of exercise per week. - Lipid panel - CBC  7. Immunization due - Tdap vaccine greater than or equal to 7yo IM  8. History of tobacco use Comments: He agrees to LDCT scan. - CT CHEST LUNG CA SCREEN LOW DOSE W/O CM; Future   Patient was given opportunity to ask questions. Patient verbalized understanding of the plan and was able to repeat key elements of the plan. All questions were answered to their satisfaction.   I, Raymond Alimentobyn N Jovany Disano, MD, have reviewed all documentation for this visit. The documentation on 10/21/22 for the exam, diagnosis, procedures, and orders are all accurate and complete.  THE PATIENT IS ENCOURAGED TO PRACTICE SOCIAL DISTANCING DUE TO THE COVID-19 PANDEMIC.

## 2022-10-22 LAB — CBC
Hematocrit: 36.9 % — ABNORMAL LOW (ref 37.5–51.0)
Hemoglobin: 12.3 g/dL — ABNORMAL LOW (ref 13.0–17.7)
MCH: 30.1 pg (ref 26.6–33.0)
MCHC: 33.3 g/dL (ref 31.5–35.7)
MCV: 90 fL (ref 79–97)
Platelets: 191 10*3/uL (ref 150–450)
RBC: 4.08 x10E6/uL — ABNORMAL LOW (ref 4.14–5.80)
RDW: 11.9 % (ref 11.6–15.4)
WBC: 4.6 10*3/uL (ref 3.4–10.8)

## 2022-10-22 LAB — CMP14+EGFR
ALT: 19 IU/L (ref 0–44)
AST: 24 IU/L (ref 0–40)
Albumin/Globulin Ratio: 1.4 (ref 1.2–2.2)
Albumin: 4.5 g/dL (ref 3.8–4.8)
Alkaline Phosphatase: 65 IU/L (ref 44–121)
BUN/Creatinine Ratio: 11 (ref 10–24)
BUN: 15 mg/dL (ref 8–27)
Bilirubin Total: 0.6 mg/dL (ref 0.0–1.2)
CO2: 25 mmol/L (ref 20–29)
Calcium: 10.1 mg/dL (ref 8.6–10.2)
Chloride: 102 mmol/L (ref 96–106)
Creatinine, Ser: 1.36 mg/dL — ABNORMAL HIGH (ref 0.76–1.27)
Globulin, Total: 3.2 g/dL (ref 1.5–4.5)
Glucose: 97 mg/dL (ref 70–99)
Potassium: 4.2 mmol/L (ref 3.5–5.2)
Sodium: 142 mmol/L (ref 134–144)
Total Protein: 7.7 g/dL (ref 6.0–8.5)
eGFR: 55 mL/min/{1.73_m2} — ABNORMAL LOW (ref >59)

## 2022-10-22 LAB — HEMOGLOBIN A1C
Est. average glucose Bld gHb Est-mCnc: 114 mg/dL
Hgb A1c MFr Bld: 5.6 % (ref 4.8–5.6)

## 2022-10-22 LAB — LIPID PANEL
Chol/HDL Ratio: 2.7 ratio (ref 0.0–5.0)
Cholesterol, Total: 128 mg/dL (ref 100–199)
HDL: 48 mg/dL (ref >39)
LDL Chol Calc (NIH): 63 mg/dL (ref 0–99)
Triglycerides: 85 mg/dL (ref 0–149)
VLDL Cholesterol Cal: 17 mg/dL (ref 5–40)

## 2022-10-22 LAB — PSA: Prostate Specific Ag, Serum: 1.1 ng/mL (ref 0.0–4.0)

## 2022-10-24 LAB — MICROALBUMIN / CREATININE URINE RATIO
Creatinine, Urine: 164.2 mg/dL
Microalb/Creat Ratio: 7 mg/g creat (ref 0–29)
Microalbumin, Urine: 11.6 ug/mL

## 2022-11-07 DIAGNOSIS — E6609 Other obesity due to excess calories: Secondary | ICD-10-CM | POA: Insufficient documentation

## 2022-11-07 DIAGNOSIS — E66811 Obesity, class 1: Secondary | ICD-10-CM | POA: Insufficient documentation

## 2022-11-09 NOTE — Telephone Encounter (Signed)
error 

## 2022-12-09 ENCOUNTER — Ambulatory Visit
Admission: RE | Admit: 2022-12-09 | Discharge: 2022-12-09 | Disposition: A | Discharge status: Home / Self Care | Payer: 59 | Source: Ambulatory Visit | Attending: Internal Medicine | Admitting: Internal Medicine

## 2022-12-09 DIAGNOSIS — Z122 Encounter for screening for malignant neoplasm of respiratory organs: Secondary | ICD-10-CM | POA: Diagnosis not present

## 2022-12-09 DIAGNOSIS — Z87891 Personal history of nicotine dependence: Secondary | ICD-10-CM

## 2022-12-09 DIAGNOSIS — J439 Emphysema, unspecified: Secondary | ICD-10-CM | POA: Diagnosis not present

## 2022-12-09 DIAGNOSIS — I700 Atherosclerosis of aorta: Secondary | ICD-10-CM | POA: Diagnosis not present

## 2022-12-13 DIAGNOSIS — M961 Postlaminectomy syndrome, not elsewhere classified: Secondary | ICD-10-CM | POA: Diagnosis not present

## 2022-12-13 DIAGNOSIS — M542 Cervicalgia: Secondary | ICD-10-CM | POA: Diagnosis not present

## 2022-12-15 DIAGNOSIS — I493 Ventricular premature depolarization: Secondary | ICD-10-CM | POA: Insufficient documentation

## 2022-12-15 NOTE — Progress Notes (Signed)
  Cardiology Office Note:   Date:  12/17/2022  ID:  Riyon Mcilroy, DOB 08/03/1950, MRN [id number]  History of Present Illness:   Clate Mackert is a 72 y.o. male who presents for who was referred by Dorothyann Peng, MD for evaluation of chest pain. We saw him in May 2018 for chest pain, he had a negative POET.   However he presented again in 2019 with continued pain and DOE.    Cath demonstrated mild diffuse CAD.  However, he does have a mildly reduced EF of 45%. I stopped his amlodipine and started spironolactone for his mildly reduced ejection fraction.   At the last visit I changed him to Lipitor   His last EF was 55 - 60%.    The patient has had no new complaints since I last saw him.  The patient denies any new symptoms such as chest discomfort, neck or arm discomfort. There has been no new shortness of breath, PND or orthopnea. There have been no reported palpitations, presyncope or syncope.   He walks quite a bit to get around but does not exercise routinely.     ROS: As stated in the HPI and negative for all other systems.  Studies Reviewed:    EKG:  NA   Risk Assessment/Calculations:              Physical Exam:   VS:  BP 104/70   Pulse 68   Ht 6\' 2"  (1.88 m)   Wt 264 lb 6.4 oz (119.9 kg)   SpO2 96%   BMI 33.95 kg/m    Wt Readings from Last 3 Encounters:  12/17/22 264 lb 6.4 oz (119.9 kg)  10/21/22 257 lb 6.4 oz (116.8 kg)  05/25/22 254 lb 6.4 oz (115.4 kg)     GEN: Well nourished, well developed in no acute distress infusion by CARDIAC: RRR, no murmurs, rubs, gallops RESPIRATORY:  Clear to auscultation without rales, wheezing or rhonchi  ABDOMEN: Soft, non-tender, non-distended EXTREMITIES:  No edema; No deformity   ASSESSMENT AND PLAN:   DM:   His A1c was 5.6 which is down from  5.7.  Per Dorothyann Peng, MD   DYSLIPIDEMIA:    LDL was much improved to 63 from  159.  I switch him to Lipitor at the last visit.     MORBID OBESITY:    He has been losing weight but seems to  have plateaued.   SLEEP APNEA:    He is starting to use CPAP.    CARDIOMYOPATHY:  The last EF was 55 - 60%.  No change in therapy.    Signed, Rollene Rotunda, MD

## 2022-12-17 ENCOUNTER — Ambulatory Visit: Payer: 59 | Attending: Cardiology | Admitting: Cardiology

## 2022-12-17 ENCOUNTER — Encounter: Payer: Self-pay | Admitting: Cardiology

## 2022-12-17 VITALS — BP 104/70 | HR 68 | Ht 74.0 in | Wt 264.4 lb

## 2022-12-17 DIAGNOSIS — I493 Ventricular premature depolarization: Secondary | ICD-10-CM

## 2022-12-17 DIAGNOSIS — G4733 Obstructive sleep apnea (adult) (pediatric): Secondary | ICD-10-CM | POA: Diagnosis not present

## 2022-12-17 DIAGNOSIS — E785 Hyperlipidemia, unspecified: Secondary | ICD-10-CM

## 2022-12-17 DIAGNOSIS — Z7984 Long term (current) use of oral hypoglycemic drugs: Secondary | ICD-10-CM

## 2022-12-17 DIAGNOSIS — E118 Type 2 diabetes mellitus with unspecified complications: Secondary | ICD-10-CM

## 2022-12-17 NOTE — Patient Instructions (Signed)
   Follow-Up: At [facility], you and your health needs are our priority.  As part of our continuing mission to provide you with exceptional heart care, we have created designated Provider Care Teams.  These Care Teams include your primary Cardiologist (physician) and Advanced Practice Providers (APPs -  Physician Assistants and Nurse Practitioners) who all work together to provide you with the care you need, when you need it.  We recommend signing up for the patient portal called "MyChart".  Sign up information is provided on this After Visit Summary.  MyChart is used to connect with patients for Virtual Visits (Telemedicine).  Patients are able to view lab/test results, encounter notes, upcoming appointments, etc.  Non-urgent messages can be sent to your provider as well.   To learn more about what you can do with MyChart, go to [email or web address].    Your next appointment:   12 month(s)  Provider:   James Hochrein, MD      

## 2022-12-19 ENCOUNTER — Other Ambulatory Visit: Payer: Self-pay | Admitting: Internal Medicine

## 2022-12-19 DIAGNOSIS — J439 Emphysema, unspecified: Secondary | ICD-10-CM

## 2022-12-19 DIAGNOSIS — Z87891 Personal history of nicotine dependence: Secondary | ICD-10-CM

## 2023-01-12 ENCOUNTER — Other Ambulatory Visit: Payer: Self-pay | Admitting: Cardiology

## 2023-01-20 DIAGNOSIS — G5603 Carpal tunnel syndrome, bilateral upper limbs: Secondary | ICD-10-CM | POA: Diagnosis not present

## 2023-01-20 DIAGNOSIS — G629 Polyneuropathy, unspecified: Secondary | ICD-10-CM | POA: Diagnosis not present

## 2023-02-17 ENCOUNTER — Encounter: Payer: Self-pay | Admitting: Internal Medicine

## 2023-02-17 ENCOUNTER — Ambulatory Visit (INDEPENDENT_AMBULATORY_CARE_PROVIDER_SITE_OTHER): Payer: 59

## 2023-02-17 ENCOUNTER — Ambulatory Visit (INDEPENDENT_AMBULATORY_CARE_PROVIDER_SITE_OTHER): Payer: 59 | Admitting: Internal Medicine

## 2023-02-17 VITALS — BP 112/70 | HR 77 | Temp 98.4°F | Ht 74.0 in | Wt 259.9 lb

## 2023-02-17 VITALS — BP 112/70 | HR 77 | Temp 98.4°F | Ht 74.0 in | Wt 260.0 lb

## 2023-02-17 DIAGNOSIS — D649 Anemia, unspecified: Secondary | ICD-10-CM | POA: Diagnosis not present

## 2023-02-17 DIAGNOSIS — Z0000 Encounter for general adult medical examination without abnormal findings: Secondary | ICD-10-CM

## 2023-02-17 DIAGNOSIS — Z634 Disappearance and death of family member: Secondary | ICD-10-CM | POA: Diagnosis not present

## 2023-02-17 DIAGNOSIS — I129 Hypertensive chronic kidney disease with stage 1 through stage 4 chronic kidney disease, or unspecified chronic kidney disease: Secondary | ICD-10-CM

## 2023-02-17 DIAGNOSIS — E6609 Other obesity due to excess calories: Secondary | ICD-10-CM

## 2023-02-17 DIAGNOSIS — N182 Chronic kidney disease, stage 2 (mild): Secondary | ICD-10-CM

## 2023-02-17 DIAGNOSIS — Z6833 Body mass index (BMI) 33.0-33.9, adult: Secondary | ICD-10-CM

## 2023-02-17 DIAGNOSIS — F4321 Adjustment disorder with depressed mood: Secondary | ICD-10-CM | POA: Diagnosis not present

## 2023-02-17 DIAGNOSIS — R7303 Prediabetes: Secondary | ICD-10-CM | POA: Diagnosis not present

## 2023-02-17 NOTE — Progress Notes (Signed)
I,[name], CMA,acting as a Neurosurgeon for [name], MD.,have documented all relevant documentation on the behalf of [name], MD,as directed by  [name], MD while in the presence of [name], MD.  Subjective:  Patient ID: Raymond Welch , male    DOB: 1950-11-02 , 72 y.o.   MRN: [id number]  Chief Complaint  Patient presents with   Hypertension    HPI  The patient is here to day for a follow-up on his bp.  He reports compliance with meds. He denies headaches, chest pain and shortness of breath.  He is also scheduled for AWV with [facility] Advisor.    Hypertension This is a chronic problem. The current episode started more than 1 year ago. The problem has been gradually improving since onset. The problem is controlled. Past treatments include angiotensin blockers and diuretics. The current treatment provides moderate improvement. Compliance problems include exercise.  Hypertensive end-organ damage includes kidney disease. There is no history of hyperaldosteronism, hypercortisolism or sleep apnea.     Past Medical History:  Diagnosis Date   Arthritis    Cardiomyopathy (HCC)    Diabetes mellitus without complication (HCC)    Hypertension    Low back pain    Pure hypercholesterolemia    Sleep apnea      Family History  Problem Relation Age of Onset   Diabetes Mother    Heart attack Mother 57       Died age 12   Aneurysm Father 8   Lung cancer Brother      Current Outpatient Medications:    acetaminophen (TYLENOL) 500 MG tablet, Take 2 tablets (1,000 mg total) by mouth every 6 (six) hours., Disp: 30 tablet, Rfl: 0   atorvastatin (LIPITOR) 40 MG tablet, Take 1 tablet (40 mg total) by mouth daily., Disp: 90 tablet, Rfl: 3   docusate sodium (COLACE) 100 MG capsule, Take 1 capsule (100 mg total) by mouth 2 (two) times daily., Disp: 10 capsule, Rfl: 0   glucose blood test strip, 1 each by Other route daily as needed for other (blood sugar). Use as  instructed, Disp: , Rfl:    losartan-hydrochlorothiazide (HYZAAR) 100-12.5 MG tablet, TAKE 1 TABLET BY MOUTH DAILY, Disp: 100 tablet, Rfl: 2   methocarbamol (ROBAXIN) 500 MG tablet, Take 1 tablet (500 mg total) by mouth every 6 (six) hours as needed for muscle spasms (muscle pain)., Disp: 40 tablet, Rfl: 0   metoprolol succinate (TOPROL-XL) 50 MG 24 hr tablet, TAKE 1 TABLET BY MOUTH DAILY  WITH OR IMMEDIATELY FOLLOWING A  MEAL, Disp: 100 tablet, Rfl: 2   ONETOUCH DELICA LANCETS 33G MISC, 1 each by Does not apply route daily as needed (blood sugar). , Disp: , Rfl:    oxyCODONE 10 MG TABS, Take 1 tablet (10 mg total) by mouth every 4 (four) hours as needed for severe pain (pain score 7-10)., Disp: 42 tablet, Rfl: 0   polyethylene glycol (MIRALAX / GLYCOLAX) 17 g packet, Take 17 g by mouth daily as needed for mild constipation., Disp: 14 each, Rfl: 0   Semaglutide (RYBELSUS) 3 MG TABS, Take 1 tablet by mouth daily. (Patient not taking: Reported on 12/17/2022), Disp: 90 tablet, Rfl: 3   spironolactone (ALDACTONE) 25 MG tablet, TAKE 1 TABLET (25 MG TOTAL) BY MOUTH DAILY., Disp: 90 tablet, Rfl: 3   tadalafil (CIALIS) 20 MG tablet, TAKE 1 TABLET BY MOUTH ONCE DAILY AS NEEDED FOR ERECTILE DYSFUNCTION, Disp: 30 tablet, Rfl: 0  No Known Allergies   Review of Systems  Constitutional: Negative.   Respiratory: Negative.    Cardiovascular: Negative.   Gastrointestinal: Negative.   Neurological: Negative.   Psychiatric/Behavioral: Negative.       Today's Vitals   02/17/23 0858  BP: 112/70  Pulse: 77  Temp: 98.4 F (36.9 C)  Weight: 259 lb 14.8 oz (117.9 kg)  Height: 6\' 2"  (1.88 m)   Body mass index is 33.37 kg/m.  Wt Readings from Last 3 Encounters:  02/17/23 259 lb 14.8 oz (117.9 kg)  02/17/23 260 lb (117.9 kg)  12/17/22 264 lb 6.4 oz (119.9 kg)     Objective:  Physical Exam Vitals and nursing note reviewed.  Constitutional:      Appearance: Normal appearance.  HENT:     Head:  Normocephalic and atraumatic.  Eyes:     Extraocular Movements: Extraocular movements intact.  Cardiovascular:     Rate and Rhythm: Normal rate and regular rhythm.     Heart sounds: Normal heart sounds.  Pulmonary:     Effort: Pulmonary effort is normal.     Breath sounds: Normal breath sounds.  Musculoskeletal:     Cervical back: Normal range of motion.  Skin:    General: Skin is warm.  Neurological:     General: No focal deficit present.     Mental Status: He is alert.  Psychiatric:        Mood and Affect: Mood normal.         Assessment And Plan:  Parenchymal renal hypertension, stage 1 through stage 4 or unspecified chronic kidney disease  Chronic renal disease, stage II  Pre-diabetes  Grief at loss of child  Anemia, unspecified type  Class 1 obesity due to excess calories with serious comorbidity and body mass index (BMI) of 33.0 to 33.9 in adult    No follow-ups on file.  Patient was given opportunity to ask questions. Patient verbalized understanding of the plan and was able to repeat key elements of the plan. All questions were answered to their satisfaction.    I, [name], MD, have reviewed all documentation for this visit. The documentation on 02/17/23 for the exam, diagnosis, procedures, and orders are all accurate and complete.   IF YOU HAVE BEEN REFERRED TO A SPECIALIST, IT MAY TAKE 1-2 WEEKS TO SCHEDULE/PROCESS THE REFERRAL. IF YOU HAVE NOT HEARD FROM US/SPECIALIST IN TWO WEEKS, PLEASE GIVE [place] A CALL AT [phone].

## 2023-02-17 NOTE — Patient Instructions (Signed)
Raymond Welch , Thank you for taking time to come for your Medicare Wellness Visit. I appreciate your ongoing commitment to your health goals. Please review the following plan we discussed and let me know if I can assist you in the future.   These are the goals we discussed:  Goals      Patient Stated     05/02/2019, no goals     Patient Stated     01/08/2021, no goals     Patient Stated     02/03/2022, no goals     Patient Stated     02/17/2023, denies any goals at this time     Weight (lb) < 200 lb (90.7 kg)     11/15/2019, wants to get to 230-250 pounds        This is a list of the screening recommended for you and due dates:  Health Maintenance  Topic Date Due   COVID-19 Vaccine (3 - Pfizer risk series) 03/05/2023*   Zoster (Shingles) Vaccine (1 of 2) 05/20/2023*   Flu Shot  03/03/2023   Colon Cancer Screening  10/02/2023   Yearly kidney function blood test for diabetes  10/21/2023   Yearly kidney health urinalysis for diabetes  10/21/2023   Screening for Lung Cancer  12/09/2023   Medicare Annual Wellness Visit  02/17/2024   Cologuard (Stool DNA test)  11/01/2025   Pneumonia Vaccine  Completed   Hepatitis C Screening  Completed   HPV Vaccine  Aged Out   DTaP/Tdap/Td vaccine  Discontinued  *Topic was postponed. The date shown is not the original due date.    Advanced directives: Advance directive discussed with you today. Even though you declined this today please call our office should you change your mind and we can give you the proper paperwork for you to fill out.  Conditions/risks identified: none  Next appointment: Follow up in one year for your annual wellness visit. Managing Pain Without Opioids Opioids are strong medicines used to treat moderate to severe pain. For some people, especially those who have long-term (chronic) pain, opioids may not be the best choice for pain management due to: Side effects like nausea, constipation, and sleepiness. The risk of  addiction (opioid use disorder). The longer you take opioids, the greater your risk of addiction. Pain that lasts for more than 3 months is called chronic pain. Managing chronic pain usually requires more than one approach and is often provided by a team of health care providers working together (multidisciplinary approach). Pain management may be done at a pain management center or pain clinic. How to manage pain without the use of opioids Use non-opioid medicines Non-opioid medicines for pain may include: Over-the-counter or prescription non-steroidal anti-inflammatory drugs (NSAIDs). These may be the first medicines used for pain. They work well for muscle and bone pain, and they reduce swelling. Acetaminophen. This over-the-counter medicine may work well for milder pain but not swelling. Antidepressants. These may be used to treat chronic pain. A certain type of antidepressant (tricyclics) is often used. These medicines are given in lower doses for pain than when used for depression. Anticonvulsants. These are usually used to treat seizures but may also reduce nerve (neuropathic) pain. Muscle relaxants. These relieve pain caused by sudden muscle tightening (spasms). You may also use a pain medicine that is applied to the skin as a patch, cream, or gel (topical analgesic), such as a numbing medicine. These may cause fewer side effects than medicines taken by mouth. Do certain therapies  as directed Some therapies can help with pain management. They include: Physical therapy. You will do exercises to gain strength and flexibility. A physical therapist may teach you exercises to move and stretch parts of your body that are weak, stiff, or painful. You can learn these exercises at physical therapy visits and practice them at home. Physical therapy may also involve: Massage. Heat wraps or applying heat or cold to affected areas. Electrical signals that interrupt pain signals (transcutaneous electrical  nerve stimulation, TENS). Weak lasers that reduce pain and swelling (low-level laser therapy). Signals from your body that help you learn to regulate pain (biofeedback). Occupational therapy. This helps you to learn ways to function at home and work with less pain. Recreational therapy. This involves trying new activities or hobbies, such as a physical activity or drawing. Mental health therapy, including: Cognitive behavioral therapy (CBT). This helps you learn coping skills for dealing with pain. Acceptance and commitment therapy (ACT) to change the way you think and react to pain. Relaxation therapies, including muscle relaxation exercises and mindfulness-based stress reduction. Pain management counseling. This may be individual, family, or group counseling.  Receive medical treatments Medical treatments for pain management include: Nerve block injections. These may include a pain blocker and anti-inflammatory medicines. You may have injections: Near the spine to relieve chronic back or neck pain. Into joints to relieve back or joint pain. Into nerve areas that supply a painful area to relieve body pain. Into muscles (trigger point injections) to relieve some painful muscle conditions. A medical device placed near your spine to help block pain signals and relieve nerve pain or chronic back pain (spinal cord stimulation device). Acupuncture. Follow these instructions at home Medicines Take over-the-counter and prescription medicines only as told by your health care provider. If you are taking pain medicine, ask your health care providers about possible side effects to watch out for. Do not drive or use heavy machinery while taking prescription opioid pain medicine. Lifestyle  Do not use drugs or alcohol to reduce pain. If you drink alcohol, limit how much you have to: 0-1 drink a day for women who are not pregnant. 0-2 drinks a day for men. Know how much alcohol is in a drink. In the  [place] one drink equals one 12 oz bottle of beer (355 mL), one 5 oz glass of wine (148 mL), or one 1 oz glass of hard liquor (44 mL). Do not use any products that contain nicotine or tobacco. These products include cigarettes, chewing tobacco, and vaping devices, such as e-cigarettes. If you need help quitting, ask your health care provider. Eat a healthy diet and maintain a healthy weight. Poor diet and excess weight may make pain worse. Eat foods that are high in fiber. These include fresh fruits and vegetables, whole grains, and beans. Limit foods that are high in fat and processed sugars, such as fried and sweet foods. Exercise regularly. Exercise lowers stress and may help relieve pain. Ask your health care provider what activities and exercises are safe for you. If your health care provider approves, join an exercise class that combines movement and stress reduction. Examples include yoga and tai chi. Get enough sleep. Lack of sleep may make pain worse. Lower stress as much as possible. Practice stress reduction techniques as told by your therapist. General instructions Work with all your pain management providers to find the treatments that work best for you. You are an important member of your pain management team. There are many things  you can do to reduce pain on your own. Consider joining an online or in-person support group for people who have chronic pain. Keep all follow-up visits. This is important. Where to find more information You can find more information about managing pain without opioids from: American Academy of Pain Medicine: painmed.org Institute for Chronic Pain: instituteforchronicpain.org American Chronic Pain Association: theacpa.org Contact a health care provider if: You have side effects from pain medicine. Your pain gets worse or does not get better with treatments or home therapy. You are struggling with anxiety or depression. Summary Many types of pain can be  managed without opioids. Chronic pain may respond better to pain management without opioids. Pain is best managed when you and a team of health care providers work together. Pain management without opioids may include non-opioid medicines, medical treatments, physical therapy, mental health therapy, and lifestyle changes. Tell your health care providers if your pain gets worse or is not being managed well enough. This information is not intended to replace advice given to you by your health care provider. Make sure you discuss any questions you have with your health care provider. Document Revised: 10/29/2020 Document Reviewed: 10/29/2020 Elsevier Patient Education  2024 [employer].   Preventive Care 65 Years and Older, Male  Preventive care refers to lifestyle choices and visits with your health care provider that can promote health and wellness. What does preventive care include? A yearly physical exam. This is also called an annual well check. Dental exams once or twice a year. Routine eye exams. Ask your health care provider how often you should have your eyes checked. Personal lifestyle choices, including: Daily care of your teeth and gums. Regular physical activity. Eating a healthy diet. Avoiding tobacco and drug use. Limiting alcohol use. Practicing safe sex. Taking low doses of aspirin every day. Taking vitamin and mineral supplements as recommended by your health care provider. What happens during an annual well check? The services and screenings done by your health care provider during your annual well check will depend on your age, overall health, lifestyle risk factors, and family history of disease. Counseling  Your health care provider may ask you questions about your: Alcohol use. Tobacco use. Drug use. Emotional well-being. Home and relationship well-being. Sexual activity. Eating habits. History of falls. Memory and ability to understand (cognition). Work  and work Astronomer. Screening  You may have the following tests or measurements: Height, weight, and BMI. Blood pressure. Lipid and cholesterol levels. These may be checked every 5 years, or more frequently if you are over 14 years old. Skin check. Lung cancer screening. You may have this screening every year starting at age 28 if you have a 30-pack-year history of smoking and currently smoke or have quit within the past 15 years. Fecal occult blood test (FOBT) of the stool. You may have this test every year starting at age 63. Flexible sigmoidoscopy or colonoscopy. You may have a sigmoidoscopy every 5 years or a colonoscopy every 10 years starting at age 63. Prostate cancer screening. Recommendations will vary depending on your family history and other risks. Hepatitis C blood test. Hepatitis B blood test. Sexually transmitted disease (STD) testing. Diabetes screening. This is done by checking your blood sugar (glucose) after you have not eaten for a while (fasting). You may have this done every 1-3 years. Abdominal aortic aneurysm (AAA) screening. You may need this if you are a current or former smoker. Osteoporosis. You may be screened starting at age 31 if  you are at high risk. Talk with your health care provider about your test results, treatment options, and if necessary, the need for more tests. Vaccines  Your health care provider may recommend certain vaccines, such as: Influenza vaccine. This is recommended every year. Tetanus, diphtheria, and acellular pertussis (Tdap, Td) vaccine. You may need a Td booster every 10 years. Zoster vaccine. You may need this after age 52. Pneumococcal 13-valent conjugate (PCV13) vaccine. One dose is recommended after age 2. Pneumococcal polysaccharide (PPSV23) vaccine. One dose is recommended after age 76. Talk to your health care provider about which screenings and vaccines you need and how often you need them. This information is not intended  to replace advice given to you by your health care provider. Make sure you discuss any questions you have with your health care provider. Document Released: 08/15/2015 Document Revised: 04/07/2016 Document Reviewed: 05/20/2015 Elsevier Interactive Patient Education  2017 [employer].  Fall Prevention in the Home Falls can cause injuries. They can happen to people of all ages. There are many things you can do to make your home safe and to help prevent falls. What can I do on the outside of my home? Regularly fix the edges of walkways and driveways and fix any cracks. Remove anything that might make you trip as you walk through a door, such as a raised step or threshold. Trim any bushes or trees on the path to your home. Use bright outdoor lighting. Clear any walking paths of anything that might make someone trip, such as rocks or tools. Regularly check to see if handrails are loose or broken. Make sure that both sides of any steps have handrails. Any raised decks and porches should have guardrails on the edges. Have any leaves, snow, or ice cleared regularly. Use sand or salt on walking paths during winter. Clean up any spills in your garage right away. This includes oil or grease spills. What can I do in the bathroom? Use night lights. Install grab bars by the toilet and in the tub and shower. Do not use towel bars as grab bars. Use non-skid mats or decals in the tub or shower. If you need to sit down in the shower, use a plastic, non-slip stool. Keep the floor dry. Clean up any water that spills on the floor as soon as it happens. Remove soap buildup in the tub or shower regularly. Attach bath mats securely with double-sided non-slip rug tape. Do not have throw rugs and other things on the floor that can make you trip. What can I do in the bedroom? Use night lights. Make sure that you have a light by your bed that is easy to reach. Do not use any sheets or blankets that are too big  for your bed. They should not hang down onto the floor. Have a firm chair that has side arms. You can use this for support while you get dressed. Do not have throw rugs and other things on the floor that can make you trip. What can I do in the kitchen? Clean up any spills right away. Avoid walking on wet floors. Keep items that you use a lot in easy-to-reach places. If you need to reach something above you, use a strong step stool that has a grab bar. Keep electrical cords out of the way. Do not use floor polish or wax that makes floors slippery. If you must use wax, use non-skid floor wax. Do not have throw rugs and other things on  the floor that can make you trip. What can I do with my stairs? Do not leave any items on the stairs. Make sure that there are handrails on both sides of the stairs and use them. Fix handrails that are broken or loose. Make sure that handrails are as long as the stairways. Check any carpeting to make sure that it is firmly attached to the stairs. Fix any carpet that is loose or worn. Avoid having throw rugs at the top or bottom of the stairs. If you do have throw rugs, attach them to the floor with carpet tape. Make sure that you have a light switch at the top of the stairs and the bottom of the stairs. If you do not have them, ask someone to add them for you. What else can I do to help prevent falls? Wear shoes that: Do not have high heels. Have rubber bottoms. Are comfortable and fit you well. Are closed at the toe. Do not wear sandals. If you use a stepladder: Make sure that it is fully opened. Do not climb a closed stepladder. Make sure that both sides of the stepladder are locked into place. Ask someone to hold it for you, if possible. Clearly mark and make sure that you can see: Any grab bars or handrails. First and last steps. Where the edge of each step is. Use tools that help you move around (mobility aids) if they are needed. These  include: Canes. Walkers. Scooters. Crutches. Turn on the lights when you go into a dark area. Replace any light bulbs as soon as they burn out. Set up your furniture so you have a clear path. Avoid moving your furniture around. If any of your floors are uneven, fix them. If there are any pets around you, be aware of where they are. Review your medicines with your doctor. Some medicines can make you feel dizzy. This can increase your chance of falling. Ask your doctor what other things that you can do to help prevent falls. This information is not intended to replace advice given to you by your health care provider. Make sure you discuss any questions you have with your health care provider. Document Released: 05/15/2009 Document Revised: 12/25/2015 Document Reviewed: 08/23/2014 Elsevier Interactive Patient Education  2017 [employer].

## 2023-02-17 NOTE — Progress Notes (Signed)
Subjective:   Raymond Welch is a 72 y.o. male who presents for Medicare Annual/Subsequent preventive examination.  Visit Complete: In person    Review of Systems     Cardiac Risk Factors include: advanced age (>42men, >73 women);dyslipidemia;hypertension;male gender;obesity (BMI >30kg/m2)     Objective:    Today's Vitals   02/17/23 0848 02/17/23 0849  BP: 112/70   Pulse: 77   Temp: 98.4 F (36.9 C)   TempSrc: Oral   SpO2: 92%   Weight: 260 lb (117.9 kg)   Height: 6\' 2"  (1.88 m)   PainSc:  8    Body mass index is 33.38 kg/m.     02/17/2023    8:55 AM 02/23/2022    2:29 PM 02/10/2022    9:14 AM 02/03/2022    8:31 AM 01/08/2021    9:42 AM 11/15/2019    9:19 AM 05/02/2019    9:26 AM  Advanced Directives  Does Patient Have a Medical Advance Directive? No No No No No No No  Would patient like information on creating a medical advance directive? No - Patient declined No - Patient declined  No - Patient declined No - Patient declined No - Patient declined No - Patient declined    Current Medications (verified) Outpatient Encounter Medications as of 02/17/2023  Medication Sig   acetaminophen (TYLENOL) 500 MG tablet Take 2 tablets (1,000 mg total) by mouth every 6 (six) hours.   atorvastatin (LIPITOR) 40 MG tablet Take 1 tablet (40 mg total) by mouth daily.   docusate sodium (COLACE) 100 MG capsule Take 1 capsule (100 mg total) by mouth 2 (two) times daily.   glucose blood test strip 1 each by Other route daily as needed for other (blood sugar). Use as instructed   losartan-hydrochlorothiazide (HYZAAR) 100-12.5 MG tablet TAKE 1 TABLET BY MOUTH DAILY   methocarbamol (ROBAXIN) 500 MG tablet Take 1 tablet (500 mg total) by mouth every 6 (six) hours as needed for muscle spasms (muscle pain).   metoprolol succinate (TOPROL-XL) 50 MG 24 hr tablet TAKE 1 TABLET BY MOUTH DAILY  WITH OR IMMEDIATELY FOLLOWING A  MEAL   ONETOUCH DELICA LANCETS 33G MISC 1 each by Does not apply route daily as  needed (blood sugar).    oxyCODONE 10 MG TABS Take 1 tablet (10 mg total) by mouth every 4 (four) hours as needed for severe pain (pain score 7-10).   polyethylene glycol (MIRALAX / GLYCOLAX) 17 g packet Take 17 g by mouth daily as needed for mild constipation.   spironolactone (ALDACTONE) 25 MG tablet TAKE 1 TABLET (25 MG TOTAL) BY MOUTH DAILY.   tadalafil (CIALIS) 20 MG tablet TAKE 1 TABLET BY MOUTH ONCE DAILY AS NEEDED FOR ERECTILE DYSFUNCTION   Semaglutide (RYBELSUS) 3 MG TABS Take 1 tablet by mouth daily. (Patient not taking: Reported on 12/17/2022)   No facility-administered encounter medications on file as of 02/17/2023.    Allergies (verified) Patient has no known allergies.   History: Past Medical History:  Diagnosis Date   Arthritis    Cardiomyopathy (HCC)    Diabetes mellitus without complication (HCC)    Hypertension    Low back pain    Pure hypercholesterolemia    Sleep apnea    Past Surgical History:  Procedure Laterality Date   APPENDECTOMY     BACK SURGERY     LEFT HEART CATH AND CORONARY ANGIOGRAPHY N/A 12/13/2017   Procedure: LEFT HEART CATH AND CORONARY ANGIOGRAPHY;  Surgeon: Tonny Bollman, MD;  Location:  [facility];  Service: Cardiovascular;  Laterality: N/A;   TOTAL HIP ARTHROPLASTY Left 02/23/2022   Procedure: TOTAL HIP ARTHROPLASTY ANTERIOR APPROACH;  Surgeon: Durene Romans, MD;  Location: [facility];  Service: Orthopedics;  Laterality: Left;   Family History  Problem Relation Age of Onset   Diabetes Mother    Heart attack Mother 6       Died age 30   Aneurysm Father 11   Lung cancer Brother    Social History   Socioeconomic History   Marital status: Married    Spouse name: Not on file   Number of children: 8   Years of education: Not on file   Highest education level: Not on file  Occupational History   Occupation: Location manager   Occupation: retired  Tobacco Use   Smoking status: Former    Current packs/day: 0.00    Average  packs/day: 0.8 packs/day for 40.0 years (30.0 ttl pk-yrs)    Types: Cigarettes    Start date: 44    Quit date: 2010    Years since quitting: 14.5   Smokeless tobacco: Never   Tobacco comments:    Quit 8 years ago.   Vaping Use   Vaping status: Never Used  Substance and Sexual Activity   Alcohol use: No   Drug use: Yes    Types: Oxycodone    Comment: Hemp: smoke   Sexual activity: Yes  Other Topics Concern   Not on file  Social History Narrative   Lives at home with wife.     Social Determinants of Health   Financial Resource Strain: Low Risk  (02/17/2023)   Overall Financial Resource Strain (CARDIA)    Difficulty of Paying Living Expenses: Not hard at all  Food Insecurity: No Food Insecurity (02/17/2023)   Hunger Vital Sign    Worried About Running Out of Food in the Last Year: Never true    Ran Out of Food in the Last Year: Never true  Transportation Needs: No Transportation Needs (02/17/2023)   PRAPARE - Administrator, Civil Service (Medical): No    Lack of Transportation (Non-Medical): No  Physical Activity: Inactive (02/17/2023)   Exercise Vital Sign    Days of Exercise per Week: 0 days    Minutes of Exercise per Session: 0 min  Stress: Stress Concern Present (02/17/2023)   [employer] of Occupational Health - Occupational Stress Questionnaire    Feeling of Stress : To some extent  Social Connections: Moderately Isolated (02/17/2023)   Social Connection and Isolation Panel [NHANES]    Frequency of Communication with Friends and Family: Three times a week    Frequency of Social Gatherings with Friends and Family: Once a week    Attends Religious Services: Never    Database administrator or Organizations: No    Attends Banker Meetings: Never    Marital Status: Married    Tobacco Counseling Counseling given: Not Answered Tobacco comments: Quit 8 years ago.    Clinical Intake:  Pre-visit preparation completed: Yes  Pain :  0-10 Pain Score: 8  Pain Type: Chronic pain Pain Location: Back Pain Orientation: Lower Pain Descriptors / Indicators: Aching Pain Onset: More than a month ago Pain Frequency: Constant     Nutritional Status: BMI > 30  Obese Nutritional Risks: None Diabetes: No  How often do you need to have someone help you when you read instructions, pamphlets, or other written materials from your doctor or pharmacy?:  1 - Never  Interpreter Needed?: No  Information entered by :: NAllen LPN   Activities of Daily Living    02/17/2023    8:50 AM 02/23/2022    2:31 PM  In your present state of health, do you have any difficulty performing the following activities:  Hearing? 0   Vision? 0   Difficulty concentrating or making decisions? 0   Walking or climbing stairs? 0   Dressing or bathing? 0   Doing errands, shopping? 0 0  Preparing Food and eating ? N   Using the Toilet? N   In the past six months, have you accidently leaked urine? N   Do you have problems with loss of bowel control? N   Managing your Medications? N   Managing your Finances? N   Housekeeping or managing your Housekeeping? N     Patient Care Team: Dorothyann Peng, MD as PCP - General (Internal Medicine) Rollene Rotunda, MD as PCP - Cardiology (Cardiology) [facility]  Indicate any recent Medical Services you may have received from other than [facility] providers in the past year (date may be approximate).     Assessment:   This is a routine wellness examination for [place].  Hearing/Vision screen Hearing Screening - Comments:: Denies hearing issues Vision Screening - Comments:: Regular eye exams, [facility]  Dietary issues and exercise activities discussed:     Goals Addressed             This Visit's Progress    Patient Stated       02/17/2023, denies any goals at this time       Depression Screen    02/17/2023    8:58 AM 10/21/2022   10:53 AM 02/03/2022    8:32 AM 01/08/2021    9:42  AM 11/15/2019    9:20 AM 11/15/2019    8:59 AM 05/02/2019    9:27 AM  PHQ 2/9 Scores  PHQ - 2 Score 6 0 0 0 0 0 0  PHQ- 9 Score 6    0  0    Fall Risk    02/17/2023    8:56 AM 10/21/2022   10:53 AM 02/03/2022    8:32 AM 01/08/2021    9:42 AM 11/15/2019    9:19 AM  Fall Risk   Falls in the past year? 1 0 0 0 0  Comment fell out the tub      Number falls in past yr: 0 0 0    Injury with Fall? 0 0 0    Risk for fall due to : Medication side effect No Fall Risks Medication side effect Medication side effect Medication side effect  Follow up Falls prevention discussed;Falls evaluation completed Falls evaluation completed Falls evaluation completed;Education provided;Falls prevention discussed Falls evaluation completed;Education provided;Falls prevention discussed Falls evaluation completed;Education provided;Falls prevention discussed    MEDICARE RISK AT HOME:  Medicare Risk at Home - 02/17/23 0857     Any stairs in or around the home? Yes    If so, are there any without handrails? No    Home free of loose throw rugs in walkways, pet beds, electrical cords, etc? Yes    Adequate lighting in your home to reduce risk of falls? Yes    Life alert? No    Use of a cane, walker or w/c? No    Grab bars in the bathroom? No    Shower chair or bench in shower? No    Elevated toilet seat  or a handicapped toilet? No             TIMED UP AND GO:  Was the test performed?  Yes  Length of time to ambulate 10 feet: 5 sec Gait steady and fast without use of assistive device    Cognitive Function:        02/17/2023    8:59 AM 02/03/2022    8:33 AM 01/08/2021    9:43 AM 11/15/2019    9:21 AM 05/02/2019    9:29 AM  6CIT Screen  What Year? 0 points 0 points 0 points 0 points 0 points  What month? 0 points 0 points 0 points 0 points 0 points  What time? 0 points 0 points 0 points 0 points 0 points  Count back from 20 0 points 0 points 0 points 0 points 0 points  Months in reverse 4 points 4 points  4 points 4 points 4 points  Repeat phrase 8 points 4 points 10 points 2 points 2 points  Total Score 12 points 8 points 14 points 6 points 6 points    Immunizations Immunization History  Administered Date(s) Administered   PFIZER(Purple Top)SARS-COV-2 Vaccination 12/07/2019, 12/28/2019   PNEUMOCOCCAL CONJUGATE-20 08/31/2021   Tdap 10/21/2022    TDAP status: Up to date  Flu Vaccine status: Up to date  Pneumococcal vaccine status: Up to date  Covid-19 vaccine status: Information provided on how to obtain vaccines.   Qualifies for Shingles Vaccine? Yes   Zostavax completed No   Shingrix Completed?: No.    Education has been provided regarding the importance of this vaccine. Patient has been advised to call insurance company to determine out of pocket expense if they have not yet received this vaccine. Advised may also receive vaccine at local pharmacy or Health Dept. Verbalized acceptance and understanding.  Screening Tests Health Maintenance  Topic Date Due   COVID-19 Vaccine (3 - Pfizer risk series) 03/05/2023 (Originally 01/25/2020)   Zoster Vaccines- Shingrix (1 of 2) 05/20/2023 (Originally 02/14/1970)   INFLUENZA VACCINE  03/03/2023   Colonoscopy  10/02/2023   Diabetic kidney evaluation - eGFR measurement  10/21/2023   Diabetic kidney evaluation - Urine ACR  10/21/2023   Lung Cancer Screening  12/09/2023   Medicare Annual Wellness (AWV)  02/17/2024   Fecal DNA (Cologuard)  11/01/2025   Pneumonia Vaccine 48+ Years old  Completed   Hepatitis C Screening  Completed   HPV VACCINES  Aged Out   DTaP/Tdap/Td  Discontinued    Health Maintenance  There are no preventive care reminders to display for this patient.   Colorectal cancer screening: Type of screening: Cologuard. Completed 11/02/2022. Repeat every 3 years  Lung Cancer Screening: (Low Dose CT Chest recommended if Age 37-80 years, 20 pack-year currently smoking OR have quit w/in 15years.) does not qualify.   Lung  Cancer Screening Referral: no  Additional Screening:  Hepatitis C Screening: does qualify; Completed 07/17/2019  Vision Screening: Recommended annual ophthalmology exams for early detection of glaucoma and other disorders of the eye. Is the patient up to date with their annual eye exam?  Yes  Who is the provider or what is the name of the office in which the patient attends annual eye exams? [facility] If pt is not established with a provider, would they like to be referred to a provider to establish care? No .   Dental Screening: Recommended annual dental exams for proper oral hygiene  Diabetic Foot Exam: n/a  Community Resource Referral /  Chronic Care Management: CRR required this visit?  No   CCM required this visit?  No     Plan:     I have personally reviewed and noted the following in the patient's chart:   Medical and social history Use of alcohol, tobacco or illicit drugs  Current medications and supplements including opioid prescriptions. Patient is currently taking opioid prescriptions. Information provided to patient regarding non-opioid alternatives. Patient advised to discuss non-opioid treatment plan with their provider. Functional ability and status Nutritional status Physical activity Advanced directives List of other physicians Hospitalizations, surgeries, and ER visits in previous 12 months Vitals Screenings to include cognitive, depression, and falls Referrals and appointments  In addition, I have reviewed and discussed with patient certain preventive protocols, quality metrics, and best practice recommendations. A written personalized care plan for preventive services as well as general preventive health recommendations were provided to patient.     Barb Merino, LPN   3/71/0626   After Visit Summary: in person  Nurse Notes: none

## 2023-02-17 NOTE — Patient Instructions (Signed)
Carpal Tunnel Syndrome  Carpal tunnel syndrome is a condition that causes pain, weakness, and numbness in your hand and arm. Numbness is when you cannot feel an area in your body. The carpal tunnel is a narrow area that is on the palm side of your wrist. Repeated wrist motion or certain diseases may cause swelling in the tunnel. This swelling can pinch the main nerve in the wrist. This nerve is called the median nerve. What are the causes? This condition may be caused by: Moving your hand and wrist over and over again while doing a task. Injury to the wrist. Arthritis. A sac of fluid (cyst) or abnormal growth (tumor) in the carpal tunnel. Fluid buildup during pregnancy. Use of tools that vibrate. Sometimes the cause is not known. What increases the risk? The following factors may make you more likely to have this condition: Having a job that makes you do these things: Move your hand over and over again. Work with tools that vibrate, such as drills or sanders. Being a woman. Having diabetes, obesity, thyroid problems, or kidney failure. What are the signs or symptoms? Symptoms of this condition include: A tingling feeling in your fingers. Tingling or loss of feeling in your hand. Pain in your entire arm. This pain may get worse when you bend your wrist and elbow for a long time. Pain in your wrist that goes up your arm to your shoulder. Pain that goes down into your palm or fingers. Weakness in your hands. You may find it hard to grab and hold items. You may feel worse at night. How is this treated? This condition may be treated with: Lifestyle changes. You will be asked to stop or change the activity that caused your problem. Doing exercises and activities that make bones, muscles, and tendons stronger (physical therapy). Learning how to use your hand again (occupational therapy). Medicines for pain and swelling. You may have injections in your wrist. A wrist splint or  brace. Surgery. Follow these instructions at home: If you have a splint or brace: Wear the splint or brace as told by your doctor. Take it off only as told by your doctor. Loosen the splint if your fingers: Tingle. Become numb. Turn cold and blue. Keep the splint or brace clean. If the splint or brace is not waterproof: Do not let it get wet. Cover it with a watertight covering when you take a bath or a shower. Managing pain, stiffness, and swelling If told, put ice on the painful area: If you have a removable splint or brace, remove it as told by your doctor. Put ice in a plastic bag. Place a towel between your skin and the bag. Leave the ice on for 20 minutes, 2-3 times per day. Do not fall asleep with the cold pack on your skin. Take off the ice if your skin turns bright red. This is very important. If you cannot feel pain, heat, or cold, you have a greater risk of damage to the area. Move your fingers often to reduce stiffness and swelling. General instructions Take over-the-counter and prescription medicines only as told by your doctor. Rest your wrist from any activity that may cause pain. If needed, talk with your boss at work about changes that can help your wrist heal. Do exercises as told by your doctor, physical therapist, or occupational therapist. Keep all follow-up visits. Contact a doctor if: You have new symptoms. Medicine does not help your pain. Your symptoms get worse. Get help right  away if: You have very bad numbness or tingling in your wrist or hand. Summary Carpal tunnel syndrome is a condition that causes pain in your hand and arm. It is often caused by repeated wrist motions. Lifestyle changes and medicines are used to treat this problem. Surgery may help in very bad cases. Follow your doctor's instructions about wearing a splint, resting your wrist, keeping follow-up visits, and calling for help. This information is not intended to replace advice given  to you by your health care provider. Make sure you discuss any questions you have with your health care provider. Document Revised: 11/29/2019 Document Reviewed: 11/29/2019 Elsevier Patient Education  2024 [employer].

## 2023-02-18 LAB — HEMOGLOBIN A1C
Est. average glucose Bld gHb Est-mCnc: 117 mg/dL
Hgb A1c MFr Bld: 5.7 % — ABNORMAL HIGH (ref 4.8–5.6)

## 2023-02-18 LAB — BASIC METABOLIC PANEL
BUN/Creatinine Ratio: 11 (ref 10–24)
BUN: 13 mg/dL (ref 8–27)
CO2: 27 mmol/L (ref 20–29)
Calcium: 10.7 mg/dL — ABNORMAL HIGH (ref 8.6–10.2)
Chloride: 100 mmol/L (ref 96–106)
Creatinine, Ser: 1.19 mg/dL (ref 0.76–1.27)
Glucose: 117 mg/dL — ABNORMAL HIGH (ref 70–99)
Potassium: 4.8 mmol/L (ref 3.5–5.2)
Sodium: 139 mmol/L (ref 134–144)
eGFR: 64 mL/min/{1.73_m2} (ref >59)

## 2023-02-18 LAB — IRON,TIBC AND FERRITIN PANEL
Ferritin: 330 ng/mL (ref 30–400)
Iron Saturation: 32 % (ref 15–55)
Iron: 95 ug/dL (ref 38–169)
Total Iron Binding Capacity: 296 ug/dL (ref 250–450)
UIBC: 201 ug/dL (ref 111–343)

## 2023-02-18 LAB — CBC
Hematocrit: 40 % (ref 37.5–51.0)
Hemoglobin: 12.9 g/dL — ABNORMAL LOW (ref 13.0–17.7)
MCH: 29.7 pg (ref 26.6–33.0)
MCHC: 32.3 g/dL (ref 31.5–35.7)
MCV: 92 fL (ref 79–97)
Platelets: 154 10*3/uL (ref 150–450)
RBC: 4.35 x10E6/uL (ref 4.14–5.80)
RDW: 11.6 % (ref 11.6–15.4)
WBC: 4.7 10*3/uL (ref 3.4–10.8)

## 2023-02-18 LAB — VITAMIN B12: Vitamin B-12: 484 pg/mL (ref 232–1245)

## 2023-02-23 DIAGNOSIS — D649 Anemia, unspecified: Secondary | ICD-10-CM | POA: Insufficient documentation

## 2023-02-23 DIAGNOSIS — F4321 Adjustment disorder with depressed mood: Secondary | ICD-10-CM | POA: Insufficient documentation

## 2023-02-23 NOTE — Assessment & Plan Note (Signed)
I will check labs as below. He denies having any gingival or rectal bleeding at this time.

## 2023-02-23 NOTE — Assessment & Plan Note (Signed)
He declines grief counseling at this time. I will refer in future as needed.

## 2023-02-23 NOTE — Assessment & Plan Note (Signed)
He is encouraged to strive for BMI less than 30 to decrease cardiac risk. Advised to aim for at least 150 minutes of exercise per week.  

## 2023-02-23 NOTE — Assessment & Plan Note (Signed)
Previous labs reviewed, his A1c has been elevated in the past. I will check an A1c today. Reminded to avoid refined sugars including sugary drinks/foods and processed meats including bacon, sausages and deli meats.     

## 2023-02-23 NOTE — Assessment & Plan Note (Addendum)
Chronic, well controlled. He will c/w losartan/hct 100/12.5mg  and spironolactone 25mg  daily. He is encouraged to follow low sodium diet.

## 2023-02-23 NOTE — Assessment & Plan Note (Signed)
Chronic, he is encouraged to stay well hydrated, avoid NSAIDs and keep BP controlled to prevent progression of CKD.

## 2023-03-04 DIAGNOSIS — M542 Cervicalgia: Secondary | ICD-10-CM | POA: Diagnosis not present

## 2023-03-04 DIAGNOSIS — G5601 Carpal tunnel syndrome, right upper limb: Secondary | ICD-10-CM | POA: Diagnosis not present

## 2023-03-04 DIAGNOSIS — M961 Postlaminectomy syndrome, not elsewhere classified: Secondary | ICD-10-CM | POA: Diagnosis not present

## 2023-03-26 ENCOUNTER — Other Ambulatory Visit: Payer: Self-pay | Admitting: Internal Medicine

## 2023-04-25 ENCOUNTER — Ambulatory Visit: Payer: 59 | Admitting: Internal Medicine

## 2023-05-04 ENCOUNTER — Other Ambulatory Visit: Payer: Self-pay | Admitting: Internal Medicine

## 2023-05-21 ENCOUNTER — Ambulatory Visit (HOSPITAL_COMMUNITY)
Admission: EM | Admit: 2023-05-21 | Discharge: 2023-05-21 | Disposition: A | Discharge status: Home / Self Care | Payer: 59 | Attending: Internal Medicine | Admitting: Internal Medicine

## 2023-05-21 ENCOUNTER — Encounter (HOSPITAL_COMMUNITY): Payer: Self-pay

## 2023-05-21 DIAGNOSIS — S39012A Strain of muscle, fascia and tendon of lower back, initial encounter: Secondary | ICD-10-CM

## 2023-05-21 DIAGNOSIS — G8929 Other chronic pain: Secondary | ICD-10-CM

## 2023-05-21 NOTE — Discharge Instructions (Addendum)
Follow-up with your back specialist.  Continue your regular medications, you may also take acetaminophen as directed on the package for pain
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
## 2023-05-30 DIAGNOSIS — M542 Cervicalgia: Secondary | ICD-10-CM | POA: Diagnosis not present

## 2023-05-30 DIAGNOSIS — M961 Postlaminectomy syndrome, not elsewhere classified: Secondary | ICD-10-CM | POA: Diagnosis not present

## 2023-05-30 DIAGNOSIS — G5601 Carpal tunnel syndrome, right upper limb: Secondary | ICD-10-CM | POA: Diagnosis not present

## 2023-06-05 ENCOUNTER — Other Ambulatory Visit: Payer: Self-pay | Admitting: Internal Medicine

## 2023-06-06 ENCOUNTER — Ambulatory Visit (INDEPENDENT_AMBULATORY_CARE_PROVIDER_SITE_OTHER): Payer: 59 | Admitting: Internal Medicine

## 2023-06-06 ENCOUNTER — Encounter: Payer: Self-pay | Admitting: Internal Medicine

## 2023-06-06 VITALS — BP 120/76 | HR 69 | Temp 98.1°F | Ht 74.0 in | Wt 268.0 lb

## 2023-06-06 DIAGNOSIS — E6609 Other obesity due to excess calories: Secondary | ICD-10-CM

## 2023-06-06 DIAGNOSIS — N182 Chronic kidney disease, stage 2 (mild): Secondary | ICD-10-CM

## 2023-06-06 DIAGNOSIS — R7303 Prediabetes: Secondary | ICD-10-CM | POA: Diagnosis not present

## 2023-06-06 DIAGNOSIS — Z2821 Immunization not carried out because of patient refusal: Secondary | ICD-10-CM

## 2023-06-06 DIAGNOSIS — E66811 Obesity, class 1: Secondary | ICD-10-CM

## 2023-06-06 DIAGNOSIS — M79642 Pain in left hand: Secondary | ICD-10-CM | POA: Diagnosis not present

## 2023-06-06 DIAGNOSIS — M79641 Pain in right hand: Secondary | ICD-10-CM | POA: Diagnosis not present

## 2023-06-06 DIAGNOSIS — M79643 Pain in unspecified hand: Secondary | ICD-10-CM | POA: Insufficient documentation

## 2023-06-06 DIAGNOSIS — R202 Paresthesia of skin: Secondary | ICD-10-CM | POA: Diagnosis not present

## 2023-06-06 DIAGNOSIS — Z79899 Other long term (current) drug therapy: Secondary | ICD-10-CM | POA: Diagnosis not present

## 2023-06-06 DIAGNOSIS — I129 Hypertensive chronic kidney disease with stage 1 through stage 4 chronic kidney disease, or unspecified chronic kidney disease: Secondary | ICD-10-CM

## 2023-06-06 DIAGNOSIS — Z6834 Body mass index (BMI) 34.0-34.9, adult: Secondary | ICD-10-CM

## 2023-06-06 MED ORDER — TADALAFIL 20 MG PO TABS: ORAL_TABLET | ORAL | 0 refills | Status: DC

## 2023-06-06 NOTE — Assessment & Plan Note (Signed)
Sx are suggestive of carpal tunnel. Unable to perform Phalen's. Neg Tinel's bilaterally. I will request nerve conduction study from [facility].

## 2023-06-06 NOTE — Assessment & Plan Note (Signed)
Previous labs reviewed, his A1c has been elevated in the past. I will check an A1c today. Reminded to avoid refined sugars including sugary drinks/foods and processed meats including bacon, sausages and deli meats.     

## 2023-06-06 NOTE — Assessment & Plan Note (Signed)
 Chronic, he is encouraged to stay well hydrated, avoid NSAIDs and keep BP controlled to prevent progression of CKD.

## 2023-06-06 NOTE — Progress Notes (Signed)
I,[name], CMA,acting as a Neurosurgeon for Raymond Aliment, MD.,have documented all relevant documentation on the behalf of Raymond Aliment, MD,as directed by  Raymond Aliment, MD while in the presence of Raymond Aliment, MD.  Subjective:  Patient ID: Raymond Welch , male    DOB: Oct 12, 1950 , 72 y.o.   MRN: [id number]  Chief Complaint  Patient presents with   Hypertension    HPI  Patient presents today for a bp check. Patient reports compliance with his meds. Patient denies having sob, chest or headaches at this time. Patient does not have any questions or concerns.   Patient would like a refill on his Cialis.   He denies having any hospitalizations since his last visit. He has been seen by both Hand and Pain specialists since his last visit. He has no new concerns.   Hypertension This is a chronic problem. The current episode started more than 1 year ago. The problem has been gradually improving since onset. The problem is controlled. Past treatments include angiotensin blockers and diuretics. The current treatment provides moderate improvement. Compliance problems include exercise.  Hypertensive end-organ damage includes kidney disease. There is no history of hyperaldosteronism, hypercortisolism or sleep apnea.     Past Medical History:  Diagnosis Date   Arthritis    Cardiomyopathy (HCC)    Diabetes mellitus without complication (HCC)    Hypertension    Low back pain    Pure hypercholesterolemia    Sleep apnea      Family History  Problem Relation Age of Onset   Diabetes Mother    Heart attack Mother 38       Died age 68   Aneurysm Father 24   Lung cancer Brother      Current Outpatient Medications:    acetaminophen (TYLENOL) 500 MG tablet, Take 2 tablets (1,000 mg total) by mouth every 6 (six) hours., Disp: 30 tablet, Rfl: 0   atorvastatin (LIPITOR) 40 MG tablet, Take 1 tablet (40 mg total) by mouth daily., Disp: 90 tablet, Rfl: 3   docusate sodium (COLACE) 100 MG  capsule, Take 1 capsule (100 mg total) by mouth 2 (two) times daily., Disp: 10 capsule, Rfl: 0   glucose blood test strip, 1 each by Other route daily as needed for other (blood sugar). Use as instructed, Disp: , Rfl:    losartan-hydrochlorothiazide (HYZAAR) 100-12.5 MG tablet, TAKE 1 TABLET BY MOUTH DAILY, Disp: 100 tablet, Rfl: 2   methocarbamol (ROBAXIN) 500 MG tablet, Take 1 tablet (500 mg total) by mouth every 6 (six) hours as needed for muscle spasms (muscle pain)., Disp: 40 tablet, Rfl: 0   metoprolol succinate (TOPROL-XL) 50 MG 24 hr tablet, TAKE 1 TABLET BY MOUTH DAILY  WITH OR IMMEDIATELY FOLLOWING A  MEAL, Disp: 100 tablet, Rfl: 2   ONETOUCH DELICA LANCETS 33G MISC, 1 each by Does not apply route daily as needed (blood sugar). , Disp: , Rfl:    oxyCODONE 10 MG TABS, Take 1 tablet (10 mg total) by mouth every 4 (four) hours as needed for severe pain (pain score 7-10)., Disp: 42 tablet, Rfl: 0   polyethylene glycol (MIRALAX / GLYCOLAX) 17 g packet, Take 17 g by mouth daily as needed for mild constipation., Disp: 14 each, Rfl: 0   Semaglutide (RYBELSUS) 3 MG TABS, Take 1 tablet by mouth daily., Disp: 90 tablet, Rfl: 3   spironolactone (ALDACTONE) 25 MG tablet, TAKE 1 TABLET (25 MG TOTAL) BY MOUTH DAILY., Disp: 90 tablet,  Rfl: 3   tadalafil (CIALIS) 20 MG tablet, TAKE 1 TABLET BY MOUTH ONCE DAILY AS NEEDED FOR ERECTILE DYSFUNCTION, Disp: 30 tablet, Rfl: 0   No Known Allergies   Review of Systems  Constitutional: Negative.   Eyes: Negative.   Respiratory: Negative.    Cardiovascular: Negative.   Gastrointestinal: Negative.   Musculoskeletal:  Positive for arthralgias.       Has b/l hand pain. Sometimes with tingling. Also with difficulty in making at fist and with stiffness at times. No known family history of rheumatoid arthritis.   Skin: Negative.   Psychiatric/Behavioral: Negative.       Today's Vitals   06/06/23 0831  BP: 120/76  Pulse: 69  Temp: 98.1 F (36.7 C)  Weight:  268 lb (121.6 kg)  Height: 6\' 2"  (1.88 m)  PainSc: 7   PainLoc: Back   Body mass index is 34.41 kg/m.  Wt Readings from Last 3 Encounters:  06/06/23 268 lb (121.6 kg)  02/17/23 259 lb 14.8 oz (117.9 kg)  02/17/23 260 lb (117.9 kg)    The ASCVD Risk score (Arnett DK, et al., 2019) failed to calculate for the following reasons:   The valid total cholesterol range is 130 to 320 mg/dL  Objective:  Physical Exam Vitals and nursing note reviewed.  Constitutional:      Appearance: Normal appearance.  HENT:     Head: Normocephalic and atraumatic.  Eyes:     Extraocular Movements: Extraocular movements intact.  Cardiovascular:     Rate and Rhythm: Normal rate and regular rhythm.     Heart sounds: Normal heart sounds.  Pulmonary:     Effort: Pulmonary effort is normal.     Breath sounds: Normal breath sounds.  Musculoskeletal:     Cervical back: Normal range of motion.     Comments: MCP swelling, neg squeeze test b/l  Skin:    General: Skin is warm.  Neurological:     General: No focal deficit present.     Mental Status: He is alert.  Psychiatric:        Mood and Affect: Mood normal.         Assessment And Plan:  Parenchymal renal hypertension, stage 1 through stage 4 or unspecified chronic kidney disease Assessment & Plan: Chronic, well controlled. He will c/w losartan/hct 100/12.5mg  and spironolactone 25mg  daily. He is encouraged to follow low sodium diet. He will f/u in four to six months.   Orders: -     TSH -     CMP14+EGFR  Chronic renal disease, stage II Assessment & Plan: Chronic, he is encouraged to stay well hydrated, avoid NSAIDs and keep BP controlled to prevent progression of CKD.    Orders: -     CMP14+EGFR  Pre-diabetes Assessment & Plan: Previous labs reviewed, his A1c has been elevated in the past. I will check an A1c today. Reminded to avoid refined sugars including sugary drinks/foods and processed meats including bacon, sausages and deli meats.     Orders: -     CMP14+EGFR -     Hemoglobin A1c  Paresthesia of hand, bilateral Assessment & Plan: Sx are suggestive of carpal tunnel. Unable to perform Phalen's. Neg Tinel's bilaterally. I will request nerve conduction study from [facility].    Pain in both hands -     ANA, IFA (with reflex) -     CYCLIC CITRUL PEPTIDE ANTIBODY, IGG/IGA -     Rheumatoid factor -     Sedimentation rate -  Uric acid  Class 1 obesity due to excess calories with serious comorbidity and body mass index (BMI) of 34.0 to 34.9 in adult Assessment & Plan: He is encouraged to strive for BMI less than 30 to decrease cardiac risk. Advised to aim for at least 150 minutes of exercise per week.    Herpes zoster vaccination declined  Influenza vaccination declined  COVID-19 vaccination declined  Drug therapy  Other orders -     Tadalafil; TAKE 1 TABLET BY MOUTH ONCE DAILY AS NEEDED FOR ERECTILE DYSFUNCTION  Dispense: 30 tablet; Refill: 0    Return if symptoms worsen or fail to improve.  Patient was given opportunity to ask questions. Patient verbalized understanding of the plan and was able to repeat key elements of the plan. All questions were answered to their satisfaction.    I, Raymond Aliment, MD, have reviewed all documentation for this visit. The documentation on 06/06/23 for the exam, diagnosis, procedures, and orders are all accurate and complete.   IF YOU HAVE BEEN REFERRED TO A SPECIALIST, IT MAY TAKE 1-2 WEEKS TO SCHEDULE/PROCESS THE REFERRAL. IF YOU HAVE NOT HEARD FROM US/SPECIALIST IN TWO WEEKS, PLEASE GIVE [place] A CALL AT [phone].

## 2023-06-06 NOTE — Assessment & Plan Note (Signed)
He is encouraged to strive for BMI less than 30 to decrease cardiac risk. Advised to aim for at least 150 minutes of exercise per week.  

## 2023-06-06 NOTE — Patient Instructions (Signed)
Hand Pain Hand pain can make it hard to do daily activities. Many things can cause hand pain. Some common causes are: Injuries. These may include: Broken bones (fractures)and cuts. Overuse injuries from doing the same movements many times (repetitive activity). Arthritis. Lumps in the tendons or joints of the hand and wrist (ganglion cysts). Nerve compression syndromes (carpal tunnel syndrome). Inflammation of the tendons (tendinitis). Infection. Follow these instructions at home: Managing pain, stiffness, and swelling     Take over-the-counter and prescription medicines only as told by your health care provider. If told, put ice on the affected area. Put ice in a plastic bag. Place a towel between your skin and the bag. Leave the ice on for 20 minutes, 2-3 times a day. If told, apply heat to the affected area before you exercise or as often as told by your provider. Use the heat source that your provider recommends, such as a moist heat pack or a heating pad. Place a towel between your skin and the heat source. Leave the heat on for 20-30 minutes. If your skin turns bright red, remove the ice or heat right away to prevent skin damage. The risk of damage is higher if you cannot feel pain, heat, or cold. Activity Take breaks from repetitive activity often. Minimize stress on your hands and wrists as much as possible. Do stretches or exercises as told by your provider. Do not do activities that make your pain worse. Wear a hand splint or support as told by your provider. Contact a health care provider if: Your pain does not get better after a few days. Your pain gets worse. Your pain affects your ability to do your daily activities. Your hand becomes warm, red, or swollen. Your hand is numb or tingling. Get help right away if: Your hand is extremely swollen or is an unusual shape. Your hand or fingers turn white or blue. You cannot move your hand, wrist, or fingers. This  information is not intended to replace advice given to you by your health care provider. Make sure you discuss any questions you have with your health care provider. Document Revised: 02/24/2022 Document Reviewed: 02/24/2022 Elsevier Patient Education  2024 Elsevier Inc.  

## 2023-06-06 NOTE — Assessment & Plan Note (Signed)
Chronic, well controlled. He will c/w losartan/hct 100/12.5mg  and spironolactone 25mg  daily. He is encouraged to follow low sodium diet. He will f/u in four to six months.

## 2023-06-08 LAB — CMP14+EGFR
ALT: 24 [IU]/L (ref 0–44)
AST: 20 [IU]/L (ref 0–40)
Albumin: 4 g/dL (ref 3.8–4.8)
Alkaline Phosphatase: 54 [IU]/L (ref 44–121)
BUN/Creatinine Ratio: 13 (ref 10–24)
BUN: 14 mg/dL (ref 8–27)
Bilirubin Total: 0.4 mg/dL (ref 0.0–1.2)
CO2: 27 mmol/L (ref 20–29)
Calcium: 10.1 mg/dL (ref 8.6–10.2)
Chloride: 102 mmol/L (ref 96–106)
Creatinine, Ser: 1.11 mg/dL (ref 0.76–1.27)
Globulin, Total: 2.7 g/dL (ref 1.5–4.5)
Glucose: 83 mg/dL (ref 70–99)
Potassium: 4.2 mmol/L (ref 3.5–5.2)
Sodium: 142 mmol/L (ref 134–144)
Total Protein: 6.7 g/dL (ref 6.0–8.5)
eGFR: 70 mL/min/{1.73_m2} (ref >59)

## 2023-06-08 LAB — CYCLIC CITRUL PEPTIDE ANTIBODY, IGG/IGA: Cyclic Citrullin Peptide Ab: 7 U (ref 0–19)

## 2023-06-08 LAB — TSH: TSH: 1.65 u[IU]/mL (ref 0.450–4.500)

## 2023-06-08 LAB — RHEUMATOID FACTOR: Rheumatoid fact SerPl-aCnc: <10 [IU]/mL (ref <14.0)

## 2023-06-08 LAB — URIC ACID: Uric Acid: 6.5 mg/dL (ref 3.8–8.4)

## 2023-06-08 LAB — HEMOGLOBIN A1C
Est. average glucose Bld gHb Est-mCnc: 123 mg/dL
Hgb A1c MFr Bld: 5.9 % — ABNORMAL HIGH (ref 4.8–5.6)

## 2023-06-08 LAB — ANTINUCLEAR ANTIBODIES, IFA: ANA Titer 1: NEGATIVE

## 2023-06-08 LAB — SEDIMENTATION RATE: Sed Rate: 3 mm/h (ref 0–30)

## 2023-08-15 ENCOUNTER — Other Ambulatory Visit: Payer: Self-pay | Admitting: Internal Medicine

## 2023-08-15 DIAGNOSIS — R202 Paresthesia of skin: Secondary | ICD-10-CM

## 2023-08-15 DIAGNOSIS — M79641 Pain in right hand: Secondary | ICD-10-CM

## 2023-08-18 ENCOUNTER — Other Ambulatory Visit: Payer: Self-pay | Admitting: Internal Medicine

## 2023-08-18 MED ORDER — TADALAFIL 20 MG PO TABS: ORAL_TABLET | ORAL | 1 refills | Status: AC

## 2023-08-23 ENCOUNTER — Telehealth: Payer: Self-pay

## 2023-08-23 DIAGNOSIS — Z1211 Encounter for screening for malignant neoplasm of colon: Secondary | ICD-10-CM

## 2023-08-23 NOTE — Telephone Encounter (Signed)
This nurse called patient to see if he has heard to schedule colonoscopy which is due this year. He stated that he had not. A referral was placed for gastroenterology.

## 2023-08-30 DIAGNOSIS — G5601 Carpal tunnel syndrome, right upper limb: Secondary | ICD-10-CM | POA: Diagnosis not present

## 2023-08-30 DIAGNOSIS — M5416 Radiculopathy, lumbar region: Secondary | ICD-10-CM | POA: Diagnosis not present

## 2023-08-30 DIAGNOSIS — M961 Postlaminectomy syndrome, not elsewhere classified: Secondary | ICD-10-CM | POA: Diagnosis not present

## 2023-09-01 DIAGNOSIS — G5603 Carpal tunnel syndrome, bilateral upper limbs: Secondary | ICD-10-CM | POA: Diagnosis not present

## 2023-09-02 ENCOUNTER — Other Ambulatory Visit: Payer: Self-pay | Admitting: Neurosurgery

## 2023-09-02 DIAGNOSIS — M961 Postlaminectomy syndrome, not elsewhere classified: Secondary | ICD-10-CM

## 2023-09-02 DIAGNOSIS — M48062 Spinal stenosis, lumbar region with neurogenic claudication: Secondary | ICD-10-CM | POA: Diagnosis not present

## 2023-09-02 DIAGNOSIS — Z96642 Presence of left artificial hip joint: Secondary | ICD-10-CM | POA: Diagnosis not present

## 2023-09-05 ENCOUNTER — Telehealth: Payer: Self-pay | Admitting: *Deleted

## 2023-09-05 NOTE — Telephone Encounter (Signed)
   Pre-operative Risk Assessment    Patient Name: Raymond Welch  DOB: 09-Aug-1950 MRN: [id number]   Date of last office visit: 12/17/22 Date of next office visit: N/A   Request for Surgical Clearance    Procedure:   RIGHT CARPAL TUNNEL RELEASE   Date of Surgery:  Clearance 09/19/23                                Surgeon:  DR. FRET [facility] Surgeon's Group or Practice Name:  [facility] Phone number:  [phone] Fax number:  [phone]   Type of Clearance Requested:   - Medical    Type of Anesthesia:   LOCAL AND IV SEDATION   Additional requests/questions:    Signed, Elliot Cousin   09/05/2023, 1:24 PM

## 2023-09-05 NOTE — Telephone Encounter (Signed)
Pt has been added to schedule 09/09/23 3:00, due to surgery being 09/19/23.  Consent on file / medications reconciled.    Patient Consent for Virtual Visit        [name] has provided verbal consent on 09/05/2023 for a virtual visit (video or telephone).   CONSENT FOR VIRTUAL VISIT FOR:  Raymond Welch  By participating in this virtual visit I agree to the following:  I hereby voluntarily request, consent and authorize [facility] and its employed or contracted physicians, physician assistants, nurse practitioners or other licensed health care professionals (the Practitioner), to provide me with telemedicine health care services (the "Services") as deemed necessary by the treating Practitioner. I acknowledge and consent to receive the Services by the Practitioner via telemedicine. I understand that the telemedicine visit will involve communicating with the Practitioner through live audiovisual communication technology and the disclosure of certain medical information by electronic transmission. I acknowledge that I have been given the opportunity to request an in-person assessment or other available alternative prior to the telemedicine visit and am voluntarily participating in the telemedicine visit.  I understand that I have the right to withhold or withdraw my consent to the use of telemedicine in the course of my care at any time, without affecting my right to future care or treatment, and that the Practitioner or I may terminate the telemedicine visit at any time. I understand that I have the right to inspect all information obtained and/or recorded in the course of the telemedicine visit and may receive copies of available information for a reasonable fee.  I understand that some of the potential risks of receiving the Services via telemedicine include:  Delay or interruption in medical evaluation due to technological equipment failure or disruption; Information transmitted may not be  sufficient (e.g. poor resolution of images) to allow for appropriate medical decision making by the Practitioner; and/or  In rare instances, security protocols could fail, causing a breach of personal health information.  Furthermore, I acknowledge that it is my responsibility to provide information about my medical history, conditions and care that is complete and accurate to the best of my ability. I acknowledge that Practitioner's advice, recommendations, and/or decision may be based on factors not within their control, such as incomplete or inaccurate data provided by me or distortions of diagnostic images or specimens that may result from electronic transmissions. I understand that the practice of medicine is not an exact science and that Practitioner makes no warranties or guarantees regarding treatment outcomes. I acknowledge that a copy of this consent can be made available to me via my patient portal [facility] MyChart), or I can request a printed copy by calling the office of [facility].    I understand that my insurance will be billed for this visit.   I have read or had this consent read to me. I understand the contents of this consent, which adequately explains the benefits and risks of the Services being provided via telemedicine.  I have been provided ample opportunity to ask questions regarding this consent and the Services and have had my questions answered to my satisfaction. I give my informed consent for the services to be provided through the use of telemedicine in my medical care

## 2023-09-05 NOTE — Telephone Encounter (Signed)
   Name: Raymond Welch  DOB: 1951-05-04  MRN: [id number]  Primary Cardiologist: Rollene Rotunda, MD   Preoperative team, please contact this patient and set up a phone call appointment for further preoperative risk assessment. Please obtain consent and complete medication review. Thank you for your help.  I confirm that guidance regarding antiplatelet and oral anticoagulation therapy has been completed and, if necessary, noted below.  None requested.  I also confirmed the patient resides in the state of [place]. As per [place] [employer] telemedicine laws, the patient must reside in the state in which the provider is licensed.   Ronney Asters, NP 09/05/2023, 1:42 PM [facility]

## 2023-09-05 NOTE — Telephone Encounter (Signed)
Pt has been scheduled for a tele visit, 09/09/23.  Consent on file / medications reconciled.

## 2023-09-08 ENCOUNTER — Ambulatory Visit (INDEPENDENT_AMBULATORY_CARE_PROVIDER_SITE_OTHER): Payer: 59 | Admitting: Internal Medicine

## 2023-09-08 ENCOUNTER — Encounter: Payer: Self-pay | Admitting: Internal Medicine

## 2023-09-08 VITALS — BP 110/80 | HR 85 | Temp 98.1°F | Ht 74.0 in | Wt 268.0 lb

## 2023-09-08 DIAGNOSIS — G5601 Carpal tunnel syndrome, right upper limb: Secondary | ICD-10-CM

## 2023-09-08 DIAGNOSIS — E66811 Obesity, class 1: Secondary | ICD-10-CM

## 2023-09-08 DIAGNOSIS — I129 Hypertensive chronic kidney disease with stage 1 through stage 4 chronic kidney disease, or unspecified chronic kidney disease: Secondary | ICD-10-CM

## 2023-09-08 DIAGNOSIS — N182 Chronic kidney disease, stage 2 (mild): Secondary | ICD-10-CM | POA: Diagnosis not present

## 2023-09-08 DIAGNOSIS — E6609 Other obesity due to excess calories: Secondary | ICD-10-CM

## 2023-09-08 DIAGNOSIS — Z1211 Encounter for screening for malignant neoplasm of colon: Secondary | ICD-10-CM

## 2023-09-08 DIAGNOSIS — Z01818 Encounter for other preprocedural examination: Secondary | ICD-10-CM

## 2023-09-08 DIAGNOSIS — D649 Anemia, unspecified: Secondary | ICD-10-CM

## 2023-09-08 DIAGNOSIS — Z6834 Body mass index (BMI) 34.0-34.9, adult: Secondary | ICD-10-CM

## 2023-09-08 NOTE — Progress Notes (Signed)
I,Victoria T Deloria Lair, CMA,acting as a Neurosurgeon for [name], MD.,have documented all relevant documentation on the behalf of [name], MD,as directed by  [name], MD while in the presence of [name], MD.  Subjective:  Patient ID: Raymond Welch , male    DOB: 1951-02-08 , 73 y.o.   MRN: [id number]  Chief Complaint  Patient presents with   Pre-op Exam    HPI  Patient presents today for BP check and pre-op evaluation. He is scheduled to have right hand carpal tunnel procedure on 09/19/23. He has no specific questions or concerns. He denies having any cp, sob, headaches and/or palpitations. He is able to carry groceries without any issues. Denies having issues walking up stairs, but states there are none in his home. There are a couple outside.   He is also scheduled for Cardiovascular pre-op appt tomorrow.    Hypertension This is a chronic problem. The current episode started more than 1 year ago. The problem has been gradually improving since onset. The problem is controlled. Past treatments include angiotensin blockers and diuretics. The current treatment provides moderate improvement. Compliance problems include exercise.  Hypertensive end-organ damage includes kidney disease. There is no history of hyperaldosteronism, hypercortisolism or sleep apnea.     Past Medical History:  Diagnosis Date   Arthritis    Cardiomyopathy (HCC)    Diabetes mellitus without complication (HCC)    Hypertension    Low back pain    Pure hypercholesterolemia    Sleep apnea      Family History  Problem Relation Age of Onset   Diabetes Mother    Heart attack Mother 46       Died age 44   Aneurysm Father 58   Lung cancer Brother      Current Outpatient Medications:    acetaminophen (TYLENOL) 500 MG tablet, Take 2 tablets (1,000 mg total) by mouth every 6 (six) hours., Disp: 30 tablet, Rfl: 0   atorvastatin (LIPITOR) 40 MG tablet, TAKE 1 TABLET BY MOUTH EVERY DAY, Disp: 90  tablet, Rfl: 3   glucose blood test strip, 1 each by Other route daily as needed for other (blood sugar). Use as instructed, Disp: , Rfl:    losartan-hydrochlorothiazide (HYZAAR) 100-12.5 MG tablet, TAKE 1 TABLET BY MOUTH DAILY, Disp: 100 tablet, Rfl: 2   methocarbamol (ROBAXIN) 500 MG tablet, Take 1 tablet (500 mg total) by mouth every 6 (six) hours as needed for muscle spasms (muscle pain)., Disp: 40 tablet, Rfl: 0   metoprolol succinate (TOPROL-XL) 50 MG 24 hr tablet, TAKE 1 TABLET BY MOUTH DAILY  WITH OR IMMEDIATELY FOLLOWING A  MEAL, Disp: 100 tablet, Rfl: 2   ONETOUCH DELICA LANCETS 33G MISC, 1 each by Does not apply route daily as needed (blood sugar). , Disp: , Rfl:    oxyCODONE 10 MG TABS, Take 1 tablet (10 mg total) by mouth every 4 (four) hours as needed for severe pain (pain score 7-10)., Disp: 42 tablet, Rfl: 0   polyethylene glycol (MIRALAX / GLYCOLAX) 17 g packet, Take 17 g by mouth daily as needed for mild constipation., Disp: 14 each, Rfl: 0   spironolactone (ALDACTONE) 25 MG tablet, TAKE 1 TABLET (25 MG TOTAL) BY MOUTH DAILY., Disp: 90 tablet, Rfl: 3   tadalafil (CIALIS) 20 MG tablet, TAKE 1 TABLET BY MOUTH ONCE DAILY AS NEEDED FOR ERECTILE DYSFUNCTION, Disp: 30 tablet, Rfl: 1   No Known Allergies   Review of Systems  Constitutional:  Negative.   HENT: Negative.    Respiratory: Negative.    Cardiovascular: Negative.   Gastrointestinal: Negative.   Skin: Negative.   Neurological: Negative.   Hematological: Negative.      Today's Vitals   09/08/23 1155  BP: 110/80  Pulse: 85  Temp: 98.1 F (36.7 C)  SpO2: 98%  Weight: 268 lb (121.6 kg)  Height: 6\' 2"  (1.88 m)   Body mass index is 34.41 kg/m.  Wt Readings from Last 3 Encounters:  09/08/23 268 lb (121.6 kg)  06/06/23 268 lb (121.6 kg)  02/17/23 259 lb 14.8 oz (117.9 kg)     Objective:  Physical Exam Vitals and nursing note reviewed.  Constitutional:      Appearance: Normal appearance. He is obese.  HENT:      Head: Normocephalic and atraumatic.  Eyes:     Extraocular Movements: Extraocular movements intact.  Cardiovascular:     Rate and Rhythm: Normal rate and regular rhythm.     Heart sounds: Normal heart sounds.  Pulmonary:     Effort: Pulmonary effort is normal.     Breath sounds: Normal breath sounds.  Musculoskeletal:     Cervical back: Normal range of motion.  Skin:    General: Skin is warm.  Neurological:     General: No focal deficit present.     Mental Status: He is alert.  Psychiatric:        Mood and Affect: Mood normal.         Assessment And Plan:  Parenchymal renal hypertension, stage 1 through stage 4 or unspecified chronic kidney disease Assessment & Plan: Chronic, well controlled. He will c/w losartan/hct 100/12.5mg  and spironolactone 25mg  daily. He is encouraged to follow low sodium diet. He will f/u in four to six months.    Chronic renal disease, stage II Assessment & Plan: Chronic, he is encouraged to stay well hydrated, avoid NSAIDs and keep BP controlled to prevent progression of CKD.     Anemia, unspecified type Assessment & Plan: Previous CBC results reviewed, HB slightly low - this has been stable. I will repeat labs as below. He denies hematochezia, melena and gingival bleeding. I will make further recommendations once results are available.    Orders: -     Protein electrophoresis, serum -     CBC -     Iron, TIBC and Ferritin Panel  Carpal tunnel syndrome of right wrist Assessment & Plan: Surgical repair is scheduled for 09/19/23.  He may proceed with surgery with an acceptable risk.    Class 1 obesity due to excess calories with serious comorbidity and body mass index (BMI) of 34.0 to 34.9 in adult Assessment & Plan: He is encouraged to strive for BMI less than 30 to decrease cardiac risk. Advised to aim for at least 150 minutes of exercise per week.    Screen for colon cancer Assessment & Plan: He was given stool cards to complete this  week.  He agrees to return cards later this week.    Pre-op exam  She is encouraged to strive for BMI less than 30 to decrease cardiac risk. Advised to aim for at least 150 minutes of exercise per week.    Return if symptoms worsen or fail to improve.  Patient was given opportunity to ask questions. Patient verbalized understanding of the plan and was able to repeat key elements of the plan. All questions were answered to their satisfaction.    I, [name], MD, have reviewed  all documentation for this visit. The documentation on 09/18/23 for the exam, diagnosis, procedures, and orders are all accurate and complete.   IF YOU HAVE BEEN REFERRED TO A SPECIALIST, IT MAY TAKE 1-2 WEEKS TO SCHEDULE/PROCESS THE REFERRAL. IF YOU HAVE NOT HEARD FROM US/SPECIALIST IN TWO WEEKS, PLEASE GIVE [place] A CALL AT [phone].   THE PATIENT IS ENCOURAGED TO PRACTICE SOCIAL DISTANCING DUE TO THE COVID-19 PANDEMIC.

## 2023-09-09 ENCOUNTER — Ambulatory Visit: Payer: 59 | Attending: Cardiology

## 2023-09-09 DIAGNOSIS — Z01810 Encounter for preprocedural cardiovascular examination: Secondary | ICD-10-CM

## 2023-09-09 NOTE — Progress Notes (Signed)
 Virtual Visit via Telephone Note   Because of Raymond Welch's co-morbid illnesses, he is at least at moderate risk for complications without adequate follow up.  This format is felt to be most appropriate for this patient at this time.  The patient did not have access to video technology/had technical difficulties with video requiring transitioning to audio format only (telephone).  All issues noted in this document were discussed and addressed.  No physical exam could be performed with this format.  Please refer to the patient's chart for his consent to telehealth for [facility].  Evaluation Performed:  Preoperative cardiovascular risk assessment _____________   Date:  09/09/2023   Patient ID:  Raymond Welch, DOB 1950/09/27, MRN [id number] Patient Location:  Home Provider location:   Office  Primary Care Provider:  Jarold Medici, MD Primary Cardiologist:  Lynwood Schilling, MD  Chief Complaint / Patient Profile   73 y.o. y/o male with a h/o mild nonobstructive CAD, mildly reduced EF recovered on last echocardiogram in 2021, diabetes mellitus, dyslipidemia, sleep apnea who is pending right carpal tunnel release on 09/19/2023 with Dr. Shari and presents today for telephonic preoperative cardiovascular risk assessment.  History of Present Illness    Raymond Welch is a 73 y.o. male who presents via audio/video conferencing for a telehealth visit today.  Pt was last seen in cardiology clinic on 12/17/2022 by Dr. Schilling.  At that time Ilario Dhaliwal was doing well.  The patient is now pending procedure as outlined above. Since his last visit, he has remained stable from a cardiac standpoint.  Today he denies chest pain, shortness of breath, lower extremity edema, fatigue, palpitations, melena, hematuria, hemoptysis, diaphoresis, weakness, presyncope, syncope, orthopnea, and PND.   Past Medical History    Past Medical History:  Diagnosis Date   Arthritis    Cardiomyopathy (HCC)    Diabetes  mellitus without complication (HCC)    Hypertension    Low back pain    Pure hypercholesterolemia    Sleep apnea    Past Surgical History:  Procedure Laterality Date   APPENDECTOMY     BACK SURGERY     LEFT HEART CATH AND CORONARY ANGIOGRAPHY N/A 12/13/2017   Procedure: LEFT HEART CATH AND CORONARY ANGIOGRAPHY;  Surgeon: Wonda Sharper, MD;  Location[facility] INVASIVE CV LAB;  Service: Cardiovascular;  Laterality: N/A;   TOTAL HIP ARTHROPLASTY Left 02/23/2022   Procedure: TOTAL HIP ARTHROPLASTY ANTERIOR APPROACH;  Surgeon: Ernie Cough, MD;  Location: [facility] ORS;  Service: Orthopedics;  Laterality: Left;   Allergies No Known Allergies Home Medications    Prior to Admission medications   Medication Sig Start Date End Date Taking? Authorizing Provider  acetaminophen  (TYLENOL ) 500 MG tablet Take 2 tablets (1,000 mg total) by mouth every 6 (six) hours. 02/24/22   Patti Rosina SAUNDERS, PA-C  atorvastatin  (LIPITOR) 40 MG tablet TAKE 1 TABLET BY MOUTH EVERY DAY 06/07/23   Jarold Medici, MD  glucose blood test strip 1 each by Other route daily as needed for other (blood sugar). Use as instructed    [provider]  losartan -hydrochlorothiazide  (HYZAAR) 100-12.5 MG tablet TAKE 1 TABLET BY MOUTH DAILY 03/28/23   Jarold Medici, MD  methocarbamol  (ROBAXIN ) 500 MG tablet Take 1 tablet (500 mg total) by mouth every 6 (six) hours as needed for muscle spasms (muscle pain). 02/24/22   Patti Rosina SAUNDERS, PA-C  metoprolol  succinate (TOPROL -XL) 50 MG 24 hr tablet TAKE 1 TABLET BY MOUTH DAILY  WITH OR IMMEDIATELY FOLLOWING A  MEAL 05/05/23   Jarold Medici, MD  [facility] DELICA LANCETS 33G MISC 1 each by Does not apply route daily as needed (blood sugar).     [provider]  oxyCODONE  10 MG TABS Take 1 tablet (10 mg total) by mouth every 4 (four) hours as needed for severe pain (pain score 7-10). 02/24/22   Patti Rosina SAUNDERS, PA-C  polyethylene glycol (MIRALAX  / GLYCOLAX ) 17 g packet Take 17 g by mouth  daily as needed for mild constipation. 02/24/22   Patti Rosina SAUNDERS, PA-C  spironolactone  (ALDACTONE ) 25 MG tablet TAKE 1 TABLET (25 MG TOTAL) BY MOUTH DAILY. 01/12/23   Lavona Agent, MD  tadalafil  (CIALIS ) 20 MG tablet TAKE 1 TABLET BY MOUTH ONCE DAILY AS NEEDED FOR ERECTILE DYSFUNCTION 08/18/23   Jarold Medici, MD   Physical Exam    Vital Signs:  Hiroki Wint does not have vital signs available for review today.  Given telephonic nature of communication, physical exam is limited. AAOx3. NAD. Normal affect.  Speech and respirations are unlabored.  Accessory Clinical Findings   None Assessment & Plan    1.  Preoperative Cardiovascular Risk Assessment: Right carpal tunnel release on 09/19/2023 with Dr. Shari  Mr. Ambrosini's perioperative risk of a major cardiac event is 0.9% according to the Revised Cardiac Risk Index (RCRI).  Therefore, he is at low risk for perioperative complications.   His functional capacity is good at 7.25 METs according to the Duke Activity Status Index (DASI). Recommendations: According to ACC/AHA guidelines, no further cardiovascular testing needed.  The patient may proceed to surgery at acceptable risk.   Antiplatelet and/or Anticoagulation Recommendations: No medication holds were requested.   The patient was advised that if he develops new symptoms prior to surgery to contact our office to arrange for a follow-up visit, and he verbalized understanding.  A copy of this note will be routed to requesting surgeon.  Time:   Today, I have spent 11 minutes with the patient with telehealth technology discussing medical history, symptoms, and management plan.    Tyrice Hewitt D Siren Porrata, NP  09/09/2023, 11:36 AM

## 2023-09-14 LAB — IRON,TIBC AND FERRITIN PANEL
Ferritin: 260 ng/mL (ref 30–400)
Iron Saturation: 44 % (ref 15–55)
Iron: 127 ug/dL (ref 38–169)
Total Iron Binding Capacity: 289 ug/dL (ref 250–450)
UIBC: 162 ug/dL (ref 111–343)

## 2023-09-14 LAB — PROTEIN ELECTROPHORESIS, SERUM
A/G Ratio: 1.2 (ref 0.7–1.7)
Albumin ELP: 3.8 g/dL (ref 2.9–4.4)
Alpha 1: 0.2 g/dL (ref 0.0–0.4)
Alpha 2: 0.7 g/dL (ref 0.4–1.0)
Beta: 1.1 g/dL (ref 0.7–1.3)
Gamma Globulin: 1.4 g/dL (ref 0.4–1.8)
Globulin, Total: 3.3 g/dL (ref 2.2–3.9)
Total Protein: 7.1 g/dL (ref 6.0–8.5)

## 2023-09-14 LAB — CBC
Hematocrit: 40.4 % (ref 37.5–51.0)
Hemoglobin: 12.7 g/dL — ABNORMAL LOW (ref 13.0–17.7)
MCH: 29.6 pg (ref 26.6–33.0)
MCHC: 31.4 g/dL — ABNORMAL LOW (ref 31.5–35.7)
MCV: 94 fL (ref 79–97)
Platelets: 210 10*3/uL (ref 150–450)
RBC: 4.29 x10E6/uL (ref 4.14–5.80)
RDW: 12 % (ref 11.6–15.4)
WBC: 7.9 10*3/uL (ref 3.4–10.8)

## 2023-09-16 ENCOUNTER — Other Ambulatory Visit (INDEPENDENT_AMBULATORY_CARE_PROVIDER_SITE_OTHER): Payer: 59 | Admitting: Internal Medicine

## 2023-09-16 ENCOUNTER — Other Ambulatory Visit: Payer: Self-pay | Admitting: Internal Medicine

## 2023-09-16 DIAGNOSIS — D649 Anemia, unspecified: Secondary | ICD-10-CM

## 2023-09-16 LAB — HEMOCCULT GUIAC POC 1CARD (OFFICE)
Card #2 Fecal Occult Blod, POC: NEGATIVE
Card #3 Fecal Occult Blood, POC: NEGATIVE
Fecal Occult Blood, POC: POSITIVE — AB

## 2023-09-17 ENCOUNTER — Other Ambulatory Visit: Payer: Self-pay | Admitting: Internal Medicine

## 2023-09-17 ENCOUNTER — Inpatient Hospital Stay: Admission: RE | Admit: 2023-09-17 | Payer: 59 | Source: Ambulatory Visit

## 2023-09-17 DIAGNOSIS — D649 Anemia, unspecified: Secondary | ICD-10-CM

## 2023-09-17 DIAGNOSIS — R195 Other fecal abnormalities: Secondary | ICD-10-CM

## 2023-09-18 DIAGNOSIS — Z01818 Encounter for other preprocedural examination: Secondary | ICD-10-CM | POA: Insufficient documentation

## 2023-09-18 DIAGNOSIS — G5601 Carpal tunnel syndrome, right upper limb: Secondary | ICD-10-CM | POA: Insufficient documentation

## 2023-09-18 DIAGNOSIS — Z1211 Encounter for screening for malignant neoplasm of colon: Secondary | ICD-10-CM | POA: Insufficient documentation

## 2023-09-18 NOTE — Assessment & Plan Note (Signed)
 Chronic, he is encouraged to stay well hydrated, avoid NSAIDs and keep BP controlled to prevent progression of CKD.

## 2023-09-18 NOTE — Assessment & Plan Note (Addendum)
Previous CBC results reviewed, HB slightly low - this has been stable. I will repeat labs as below. He denies hematochezia, melena and gingival bleeding. I will make further recommendations once results are available.

## 2023-09-18 NOTE — Assessment & Plan Note (Signed)
 Chronic, well controlled. He will c/w losartan/hct 100/12.5mg  and spironolactone 25mg  daily. He is encouraged to follow low sodium diet. He will f/u in four to six months.

## 2023-09-18 NOTE — Assessment & Plan Note (Signed)
 He is encouraged to strive for BMI less than 30 to decrease cardiac risk. Advised to aim for at least 150 minutes of exercise per week.

## 2023-09-18 NOTE — Assessment & Plan Note (Signed)
He was given stool cards to complete this week.  He agrees to return cards later this week.

## 2023-09-18 NOTE — Assessment & Plan Note (Signed)
Surgical repair is scheduled for 09/19/23.  He may proceed with surgery with an acceptable risk.

## 2023-09-19 ENCOUNTER — Telehealth: Payer: 59

## 2023-09-19 DIAGNOSIS — G5601 Carpal tunnel syndrome, right upper limb: Secondary | ICD-10-CM | POA: Diagnosis not present

## 2023-09-28 NOTE — Telephone Encounter (Signed)
 Copied from CRM [id number]. Topic: Clinical - Lab/Test Results >> Sep 28, 2023 11:36 AM Raymond Welch wrote: Reason for CRM: calling about stool test results- shows final result but nothing noted by doctor- [phone]

## 2023-09-28 NOTE — Telephone Encounter (Signed)
 Routing to clinical practice r/t stool sample results

## 2023-09-30 DIAGNOSIS — G5601 Carpal tunnel syndrome, right upper limb: Secondary | ICD-10-CM | POA: Diagnosis not present

## 2023-10-02 ENCOUNTER — Ambulatory Visit
Admission: RE | Admit: 2023-10-02 | Discharge: 2023-10-02 | Disposition: A | Discharge status: Home / Self Care | Payer: 59 | Source: Ambulatory Visit | Attending: Neurosurgery | Admitting: Neurosurgery

## 2023-10-02 DIAGNOSIS — M5126 Other intervertebral disc displacement, lumbar region: Secondary | ICD-10-CM | POA: Diagnosis not present

## 2023-10-02 DIAGNOSIS — M961 Postlaminectomy syndrome, not elsewhere classified: Secondary | ICD-10-CM

## 2023-10-02 DIAGNOSIS — M48061 Spinal stenosis, lumbar region without neurogenic claudication: Secondary | ICD-10-CM | POA: Diagnosis not present

## 2023-10-08 ENCOUNTER — Other Ambulatory Visit: Payer: Self-pay

## 2023-10-08 ENCOUNTER — Emergency Department (HOSPITAL_BASED_OUTPATIENT_CLINIC_OR_DEPARTMENT_OTHER): Admitting: Radiology

## 2023-10-08 ENCOUNTER — Encounter (HOSPITAL_BASED_OUTPATIENT_CLINIC_OR_DEPARTMENT_OTHER): Payer: Self-pay

## 2023-10-08 ENCOUNTER — Emergency Department (HOSPITAL_BASED_OUTPATIENT_CLINIC_OR_DEPARTMENT_OTHER)

## 2023-10-08 ENCOUNTER — Emergency Department (HOSPITAL_BASED_OUTPATIENT_CLINIC_OR_DEPARTMENT_OTHER)
Admission: EM | Admit: 2023-10-08 | Discharge: 2023-10-08 | Disposition: A | Discharge status: Home / Self Care | Attending: Emergency Medicine | Admitting: Emergency Medicine

## 2023-10-08 DIAGNOSIS — M47816 Spondylosis without myelopathy or radiculopathy, lumbar region: Secondary | ICD-10-CM | POA: Diagnosis not present

## 2023-10-08 DIAGNOSIS — M25552 Pain in left hip: Secondary | ICD-10-CM | POA: Diagnosis not present

## 2023-10-08 DIAGNOSIS — Y92410 Unspecified street and highway as the place of occurrence of the external cause: Secondary | ICD-10-CM | POA: Insufficient documentation

## 2023-10-08 DIAGNOSIS — E119 Type 2 diabetes mellitus without complications: Secondary | ICD-10-CM | POA: Diagnosis not present

## 2023-10-08 DIAGNOSIS — Z96642 Presence of left artificial hip joint: Secondary | ICD-10-CM | POA: Diagnosis not present

## 2023-10-08 DIAGNOSIS — I10 Essential (primary) hypertension: Secondary | ICD-10-CM | POA: Diagnosis not present

## 2023-10-08 DIAGNOSIS — M545 Low back pain, unspecified: Secondary | ICD-10-CM | POA: Diagnosis not present

## 2023-10-08 DIAGNOSIS — M5136 Other intervertebral disc degeneration, lumbar region with discogenic back pain only: Secondary | ICD-10-CM | POA: Diagnosis not present

## 2023-10-08 DIAGNOSIS — I700 Atherosclerosis of aorta: Secondary | ICD-10-CM | POA: Diagnosis not present

## 2023-10-08 DIAGNOSIS — S3992XA Unspecified injury of lower back, initial encounter: Secondary | ICD-10-CM | POA: Diagnosis not present

## 2023-10-08 DIAGNOSIS — Z79899 Other long term (current) drug therapy: Secondary | ICD-10-CM | POA: Insufficient documentation

## 2023-10-08 MED ORDER — METHOCARBAMOL 500 MG PO TABS: 500.0000 mg | ORAL_TABLET | Freq: Two times a day (BID) | ORAL | 0 refills | Status: AC
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
## 2023-10-12 ENCOUNTER — Telehealth: Payer: Self-pay

## 2023-10-17 ENCOUNTER — Ambulatory Visit: Admitting: Internal Medicine

## 2023-10-24 ENCOUNTER — Encounter: Payer: Self-pay | Admitting: Internal Medicine

## 2023-10-24 ENCOUNTER — Ambulatory Visit: Payer: Self-pay | Admitting: Internal Medicine

## 2023-10-24 VITALS — BP 124/80 | HR 80 | Temp 98.2°F | Ht 74.0 in | Wt 280.8 lb

## 2023-10-24 DIAGNOSIS — Z0000 Encounter for general adult medical examination without abnormal findings: Secondary | ICD-10-CM | POA: Insufficient documentation

## 2023-10-24 DIAGNOSIS — N182 Chronic kidney disease, stage 2 (mild): Secondary | ICD-10-CM | POA: Diagnosis not present

## 2023-10-24 DIAGNOSIS — G8929 Other chronic pain: Secondary | ICD-10-CM

## 2023-10-24 DIAGNOSIS — I700 Atherosclerosis of aorta: Secondary | ICD-10-CM | POA: Diagnosis not present

## 2023-10-24 DIAGNOSIS — M549 Dorsalgia, unspecified: Secondary | ICD-10-CM

## 2023-10-24 DIAGNOSIS — Z6836 Body mass index (BMI) 36.0-36.9, adult: Secondary | ICD-10-CM

## 2023-10-24 DIAGNOSIS — E66812 Obesity, class 2: Secondary | ICD-10-CM

## 2023-10-24 DIAGNOSIS — R7303 Prediabetes: Secondary | ICD-10-CM

## 2023-10-24 DIAGNOSIS — R7309 Other abnormal glucose: Secondary | ICD-10-CM | POA: Diagnosis not present

## 2023-10-24 DIAGNOSIS — I130 Hypertensive heart and chronic kidney disease with heart failure and stage 1 through stage 4 chronic kidney disease, or unspecified chronic kidney disease: Secondary | ICD-10-CM | POA: Insufficient documentation

## 2023-10-24 LAB — POCT URINALYSIS DIPSTICK
Bilirubin, UA: NEGATIVE
Blood, UA: NEGATIVE
Glucose, UA: NEGATIVE
Ketones, UA: NEGATIVE
Leukocytes, UA: NEGATIVE
Nitrite, UA: NEGATIVE
Protein, UA: NEGATIVE
Spec Grav, UA: 1.02 (ref 1.010–1.025)
Urobilinogen, UA: 2 U/dL — AB
pH, UA: 6.5 (ref 5.0–8.0)

## 2023-10-24 NOTE — Patient Instructions (Signed)
 Health Maintenance, Male  Adopting a healthy lifestyle and getting preventive care are important in promoting health and wellness. Ask your health care provider about:  The right schedule for you to have regular tests and exams.  Things you can do on your own to prevent diseases and keep yourself healthy.  What should I know about diet, weight, and exercise?  Eat a healthy diet    Eat a diet that includes plenty of vegetables, fruits, low-fat dairy products, and lean protein.  Do not eat a lot of foods that are high in solid fats, added sugars, or sodium.  Maintain a healthy weight  Body mass index (BMI) is a measurement that can be used to identify possible weight problems. It estimates body fat based on height and weight. Your health care provider can help determine your BMI and help you achieve or maintain a healthy weight.  Get regular exercise  Get regular exercise. This is one of the most important things you can do for your health. Most adults should:  Exercise for at least 150 minutes each week. The exercise should increase your heart rate and make you sweat (moderate-intensity exercise).  Do strengthening exercises at least twice a week. This is in addition to the moderate-intensity exercise.  Spend less time sitting. Even light physical activity can be beneficial.  Watch cholesterol and blood lipids  Have your blood tested for lipids and cholesterol at 74 years of age, then have this test every 5 years.  You may need to have your cholesterol levels checked more often if:  Your lipid or cholesterol levels are high.  You are older than 74 years of age.  You are at high risk for heart disease.  What should I know about cancer screening?  Many types of cancers can be detected early and may often be prevented. Depending on your health history and family history, you may need to have cancer screening at various ages. This may include screening for:  Colorectal cancer.  Prostate cancer.  Skin cancer.  Lung  cancer.  What should I know about heart disease, diabetes, and high blood pressure?  Blood pressure and heart disease  High blood pressure causes heart disease and increases the risk of stroke. This is more likely to develop in people who have high blood pressure readings or are overweight.  Talk with your health care provider about your target blood pressure readings.  Have your blood pressure checked:  Every 3-5 years if you are 9-95 years of age.  Every year if you are 85 years old or older.  If you are between the ages of 29 and 29 and are a current or former smoker, ask your health care provider if you should have a one-time screening for abdominal aortic aneurysm (AAA).  Diabetes  Have regular diabetes screenings. This checks your fasting blood sugar level. Have the screening done:  Once every three years after age 23 if you are at a normal weight and have a low risk for diabetes.  More often and at a younger age if you are overweight or have a high risk for diabetes.  What should I know about preventing infection?  Hepatitis B  If you have a higher risk for hepatitis B, you should be screened for this virus. Talk with your health care provider to find out if you are at risk for hepatitis B infection.  Hepatitis C  Blood testing is recommended for:  Everyone born from [birth year] through 1966.  Anyone  with known risk factors for hepatitis C.  Sexually transmitted infections (STIs)  You should be screened each year for STIs, including gonorrhea and chlamydia, if:  You are sexually active and are younger than 74 years of age.  You are older than 74 years of age and your health care provider tells you that you are at risk for this type of infection.  Your sexual activity has changed since you were last screened, and you are at increased risk for chlamydia or gonorrhea. Ask your health care provider if you are at risk.  Ask your health care provider about whether you are at high risk for HIV. Your health care provider  may recommend a prescription medicine to help prevent HIV infection. If you choose to take medicine to prevent HIV, you should first get tested for HIV. You should then be tested every 3 months for as long as you are taking the medicine.  Follow these instructions at home:  Alcohol use  Do not drink alcohol if your health care provider tells you not to drink.  If you drink alcohol:  Limit how much you have to 0-2 drinks a day.  Know how much alcohol is in your drink. In the [place] one drink equals one 12 oz bottle of beer (355 mL), one 5 oz glass of wine (148 mL), or one 1 oz glass of hard liquor (44 mL).  Lifestyle  Do not use any products that contain nicotine or tobacco. These products include cigarettes, chewing tobacco, and vaping devices, such as e-cigarettes. If you need help quitting, ask your health care provider.  Do not use street drugs.  Do not share needles.  Ask your health care provider for help if you need support or information about quitting drugs.  General instructions  Schedule regular health, dental, and eye exams.  Stay current with your vaccines.  Tell your health care provider if:  You often feel depressed.  You have ever been abused or do not feel safe at home.  Summary  Adopting a healthy lifestyle and getting preventive care are important in promoting health and wellness.  Follow your health care provider's instructions about healthy diet, exercising, and getting tested or screened for diseases.  Follow your health care provider's instructions on monitoring your cholesterol and blood pressure.  This information is not intended to replace advice given to you by your health care provider. Make sure you discuss any questions you have with your health care provider.  Document Revised: 12/08/2020 Document Reviewed: 12/08/2020  Elsevier Patient Education  2024 [employer].
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
## 2023-10-26 LAB — LIPID PANEL
Chol/HDL Ratio: 3.3 ratio (ref 0.0–5.0)
Cholesterol, Total: 123 mg/dL (ref 100–199)
HDL: 37 mg/dL — ABNORMAL LOW (ref >39)
LDL Chol Calc (NIH): 60 mg/dL (ref 0–99)
Triglycerides: 153 mg/dL — ABNORMAL HIGH (ref 0–149)
VLDL Cholesterol Cal: 26 mg/dL (ref 5–40)

## 2023-10-26 LAB — CMP14+EGFR
ALT: 18 IU/L (ref 0–44)
AST: 32 IU/L (ref 0–40)
Albumin: 4.5 g/dL (ref 3.8–4.8)
Alkaline Phosphatase: 57 IU/L (ref 44–121)
BUN/Creatinine Ratio: 13 (ref 10–24)
BUN: 15 mg/dL (ref 8–27)
Bilirubin Total: 0.3 mg/dL (ref 0.0–1.2)
CO2: 24 mmol/L (ref 20–29)
Calcium: 9.8 mg/dL (ref 8.6–10.2)
Chloride: 100 mmol/L (ref 96–106)
Creatinine, Ser: 1.18 mg/dL (ref 0.76–1.27)
Globulin, Total: 2.7 g/dL (ref 1.5–4.5)
Glucose: 94 mg/dL (ref 70–99)
Potassium: 4.3 mmol/L (ref 3.5–5.2)
Sodium: 139 mmol/L (ref 134–144)
Total Protein: 7.2 g/dL (ref 6.0–8.5)
eGFR: 65 mL/min/{1.73_m2} (ref >59)

## 2023-10-26 LAB — MICROALBUMIN / CREATININE URINE RATIO
Creatinine, Urine: 138.7 mg/dL
Microalb/Creat Ratio: 7 mg/g{creat} (ref 0–29)
Microalbumin, Urine: 9.1 ug/mL

## 2023-10-26 LAB — HEMOGLOBIN A1C
Est. average glucose Bld gHb Est-mCnc: 123 mg/dL
Hgb A1c MFr Bld: 5.9 % — ABNORMAL HIGH (ref 4.8–5.6)

## 2023-10-26 LAB — CBC
Hematocrit: 39.9 % (ref 37.5–51.0)
Hemoglobin: 13.2 g/dL (ref 13.0–17.7)
MCH: 30.8 pg (ref 26.6–33.0)
MCHC: 33.1 g/dL (ref 31.5–35.7)
MCV: 93 fL (ref 79–97)
Platelets: 165 10*3/uL (ref 150–450)
RBC: 4.28 x10E6/uL (ref 4.14–5.80)
RDW: 12.3 % (ref 11.6–15.4)
WBC: 5.6 10*3/uL (ref 3.4–10.8)

## 2023-10-27 ENCOUNTER — Encounter: Payer: Self-pay | Admitting: Internal Medicine

## 2023-10-27 DIAGNOSIS — S7002XA Contusion of left hip, initial encounter: Secondary | ICD-10-CM | POA: Diagnosis not present

## 2023-10-29 ENCOUNTER — Encounter: Payer: Self-pay | Admitting: Internal Medicine

## 2023-10-30 DIAGNOSIS — G8929 Other chronic pain: Secondary | ICD-10-CM | POA: Insufficient documentation

## 2023-10-30 NOTE — Assessment & Plan Note (Signed)
 Previous labs reviewed, his A1c has been elevated in the past. I will check an A1c today. Reminded to avoid refined sugars including sugary drinks/foods and processed meats including bacon, sausages and deli meats.

## 2023-10-30 NOTE — Assessment & Plan Note (Signed)
 Chronic, controlled. EKG performed, NSR w/o acute changes. He will continue with losartan/hydrochlorothiazide 100/12.5mg  daily. Encouraged to follow heart healthy lifestyle and low sodium diet. He will f/u in four to six months for re-evaluation.
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
## 2023-10-30 NOTE — Assessment & Plan Note (Signed)
 Chronic, LDL goal is less than 70.  He is encouraged to follow heart healthy lifestyle. Clean eating, regular exercise and stress management are all a part of this lifestyle. He is also encouraged to continue with statin therapy. He will c/w atorvastatin 40mg  daily.

## 2023-10-30 NOTE — Assessment & Plan Note (Signed)

## 2023-10-30 NOTE — Assessment & Plan Note (Signed)
 He is encouraged to initially strive for BMI less than 30 to decrease cardiac risk. He is advised to exercise no less than 150 minutes per week.

## 2023-10-30 NOTE — Assessment & Plan Note (Signed)
 ED notes reviewed in full detail. Imaging study results reviewed with patient in full detail as requested.

## 2023-10-30 NOTE — Assessment & Plan Note (Signed)
 Chronic, he is encouraged to stay well hydrated, avoid NSAIDs and keep BP controlled to prevent progression of CKD.

## 2023-11-08 DIAGNOSIS — D123 Benign neoplasm of transverse colon: Secondary | ICD-10-CM | POA: Diagnosis not present

## 2023-11-08 DIAGNOSIS — Z860101 Personal history of adenomatous and serrated colon polyps: Secondary | ICD-10-CM | POA: Diagnosis not present

## 2023-11-08 DIAGNOSIS — Z09 Encounter for follow-up examination after completed treatment for conditions other than malignant neoplasm: Secondary | ICD-10-CM | POA: Diagnosis not present

## 2023-11-08 DIAGNOSIS — K5730 Diverticulosis of large intestine without perforation or abscess without bleeding: Secondary | ICD-10-CM | POA: Diagnosis not present

## 2023-11-08 DIAGNOSIS — K648 Other hemorrhoids: Secondary | ICD-10-CM | POA: Diagnosis not present

## 2023-11-08 LAB — HM COLONOSCOPY

## 2023-11-10 DIAGNOSIS — D123 Benign neoplasm of transverse colon: Secondary | ICD-10-CM | POA: Diagnosis not present

## 2023-11-29 ENCOUNTER — Encounter: Payer: Self-pay | Admitting: Internal Medicine

## 2023-11-29 DIAGNOSIS — M542 Cervicalgia: Secondary | ICD-10-CM | POA: Diagnosis not present

## 2023-11-29 DIAGNOSIS — M961 Postlaminectomy syndrome, not elsewhere classified: Secondary | ICD-10-CM | POA: Diagnosis not present

## 2023-12-07 ENCOUNTER — Other Ambulatory Visit: Payer: Self-pay | Admitting: Internal Medicine

## 2023-12-23 ENCOUNTER — Ambulatory Visit: Payer: Self-pay

## 2023-12-23 NOTE — Telephone Encounter (Signed)
  Chief Complaint: Gout in left foot Symptoms: pain Frequency: yesterday Pertinent Negatives: Patient denies any other s/s Disposition: [] ED /[] Urgent Care (no appt availability in office) / [] Appointment(In office/virtual)/ []  [facility]/ [] Home Care/ [x] Refused Recommended Disposition /[] [facility]/ []  Follow-up with PCP Additional Notes: pt states that he is having a gout flare and would like medication called into pharmacy for this. Pt has had gout in the past although it has been awhile ago and is sure that this is what the problem is.  Please advise.  Copied from CRM [id number]. Topic: Clinical - Red Word Triage >> Dec 23, 2023  1:36 PM [employer] wrote: Red Word that prompted transfer to Nurse Triage: Patient has swelling and pain in feet and can barely walk. Pain level is 9. He is requesting Gout medication be called in to preferred pharmacy.  Preferred Pharmacy: [id number] - [place], [place] - [address] AT [facility] OF [address] [place] [address] Phone: [phone] Fax: [phone] Hours: Open 24 hours Reason for Disposition  Entire foot is cool or blue in comparison to other foot  [1] MODERATE pain (e.g., interferes with normal activities, limping) AND [2] present > 3 days  Answer Assessment - Initial Assessment Questions 1. ONSET: "When did the pain start?"      yesterday 2. LOCATION: "Where is the pain located?"      Left foot 3. PAIN: "How bad is the pain?"    (Scale 1-10; or mild, moderate, severe)  - MILD (1-3): doesn't interfere with normal activities.   - MODERATE (4-7): interferes with normal activities (e.g., work or school) or awakens from sleep, limping.   - SEVERE (8-10): excruciating pain, unable to do any normal activities, unable to walk.      severe 4. WORK OR EXERCISE: "Has there been any recent work or exercise that involved this part of the body?"      no 5. CAUSE: "What do you  think is causing the foot pain?"     gout  Protocols used: Foot Pain-A-AH

## 2023-12-31 ENCOUNTER — Other Ambulatory Visit: Payer: Self-pay | Admitting: Internal Medicine

## 2024-02-20 ENCOUNTER — Encounter: Payer: 59 | Admitting: Internal Medicine

## 2024-02-22 ENCOUNTER — Ambulatory Visit: Payer: 59

## 2024-02-22 VITALS — BP 110/78 | HR 78 | Temp 97.8°F | Ht 74.0 in | Wt 284.2 lb

## 2024-02-22 DIAGNOSIS — Z0000 Encounter for general adult medical examination without abnormal findings: Secondary | ICD-10-CM

## 2024-02-22 NOTE — Patient Instructions (Addendum)
 Raymond Welch , Thank you for taking time out of your busy schedule to complete your Annual Wellness Visit with me. I enjoyed our conversation and look forward to speaking with you again next year. I, as well as your care team,  appreciate your ongoing commitment to your health goals. Please review the following plan we discussed and let me know if I can assist you in the future. Your Game plan/ To Do List    Referrals: If you haven't heard from the office you've been referred to, please reach out to them at the phone provided.  N/a Follow up Visits: Next Medicare AWV with our clinical staff: office will schedule   Have you seen your provider in the last 6 months (3 months if uncontrolled diabetes)? Yes Next Office Visit with your provider: 04/25/2024 at 9:20  Clinician Recommendations:  Aim for 30 minutes of exercise or brisk walking, 6-8 glasses of water, and 5 servings of fruits and vegetables each day.       This is a list of the screening recommended for you and due dates:  Health Maintenance  Topic Date Due   COVID-19 Vaccine (3 - Pfizer risk series) 03/09/2024*   Zoster (Shingles) Vaccine (1 of 2) 05/24/2024*   Flu Shot  03/02/2024   Medicare Annual Wellness Visit  02/21/2025   Cologuard (Stool DNA test)  11/01/2025   Colon Cancer Screening  11/07/2033   Pneumococcal Vaccine for age over 58  Completed   Hepatitis C Screening  Completed   Hepatitis B Vaccine  Aged Out   HPV Vaccine  Aged Out   Meningitis B Vaccine  Aged Out   Screening for Lung Cancer  Discontinued   DTaP/Tdap/Td vaccine  Discontinued  *Topic was postponed. The date shown is not the original due date.    Advanced directives: (Declined) Advance directive discussed with you today. Even though you declined this today, please call our office should you change your mind, and we can give you the proper paperwork for you to fill out. Advance Care Planning is important because it:  [x]  Makes sure you receive the medical  care that is consistent with your values, goals, and preferences  [x]  It provides guidance to your family and loved ones and reduces their decisional burden about whether or not they are making the right decisions based on your wishes.  Follow the link provided in your after visit summary or read over the paperwork we have mailed to you to help you started getting your Advance Directives in place. If you need assistance in completing these, please reach out to us  so that we can help you!  See attachments for Preventive Care and Fall Prevention Tips. \]

## 2024-02-22 NOTE — Progress Notes (Signed)
 Subjective:   Raymond Welch is a 73 y.o. who presents for a Medicare Wellness preventive visit.  As a reminder, Annual Wellness Visits don't include a physical exam, and some assessments may be limited, especially if this visit is performed virtually. We may recommend an in-person follow-up visit with your provider if needed.  Visit Complete: In person    Persons Participating in Visit: Patient.  AWV Questionnaire: No: Patient Medicare AWV questionnaire was not completed prior to this visit.  Cardiac Risk Factors include: advanced age (>57men, >62 women);dyslipidemia;hypertension;male gender     Objective:    Today's Vitals   02/22/24 0826  BP: 110/78  Pulse: 78  Temp: 97.8 F (36.6 C)  TempSrc: Oral  SpO2: 92%  Weight: 284 lb 3.2 oz (128.9 kg)  Height: 6' 2 (1.88 m)   Body mass index is 36.49 kg/m.     02/22/2024    8:33 AM 02/17/2023    8:55 AM 02/23/2022    2:29 PM 02/10/2022    9:14 AM 02/03/2022    8:31 AM 01/08/2021    9:42 AM 11/15/2019    9:19 AM  Advanced Directives  Does Patient Have a Medical Advance Directive? No No No No No No No  Would patient like information on creating a medical advance directive? No - Patient declined No - Patient declined No - Patient declined  No - Patient declined No - Patient declined No - Patient declined    Current Medications (verified) Outpatient Encounter Medications as of 02/22/2024  Medication Sig   acetaminophen  (TYLENOL ) 500 MG tablet Take 2 tablets (1,000 mg total) by mouth every 6 (six) hours.   atorvastatin  (LIPITOR) 40 MG tablet TAKE 1 TABLET BY MOUTH EVERY DAY   glucose blood test strip 1 each by Other route daily as needed for other (blood sugar). Use as instructed   losartan -hydrochlorothiazide  (HYZAAR) 100-12.5 MG tablet TAKE 1 TABLET BY MOUTH DAILY   methocarbamol  (ROBAXIN ) 500 MG tablet Take 1 tablet (500 mg total) by mouth every 6 (six) hours as needed for muscle spasms (muscle pain).   metoprolol  succinate  (TOPROL -XL) 50 MG 24 hr tablet TAKE 1 TABLET BY MOUTH DAILY  WITH OR IMMEDIATELY FOLLOWING A  MEAL   ONETOUCH DELICA LANCETS 33G MISC 1 each by Does not apply route daily as needed (blood sugar).    oxyCODONE  10 MG TABS Take 1 tablet (10 mg total) by mouth every 4 (four) hours as needed for severe pain (pain score 7-10).   spironolactone  (ALDACTONE ) 25 MG tablet TAKE 1 TABLET (25 MG TOTAL) BY MOUTH DAILY.   tadalafil  (CIALIS ) 20 MG tablet TAKE 1 TABLET BY MOUTH ONCE DAILY AS NEEDED FOR ERECTILE DYSFUNCTION   methocarbamol  (ROBAXIN ) 500 MG tablet Take 1 tablet (500 mg total) by mouth 2 (two) times daily. (Patient not taking: Reported on 02/22/2024)   polyethylene glycol (MIRALAX  / GLYCOLAX ) 17 g packet Take 17 g by mouth daily as needed for mild constipation. (Patient not taking: Reported on 02/22/2024)   No facility-administered encounter medications on file as of 02/22/2024.    Allergies (verified) Patient has no known allergies.   History: Past Medical History:  Diagnosis Date   Arthritis    Cardiomyopathy (HCC)    Diabetes mellitus without complication (HCC)    Hypertension    Low back pain    Myocardial infarction (HCC)    Osteoporosis    Pure hypercholesterolemia    Sleep apnea    Past Surgical History:  Procedure Laterality Date  APPENDECTOMY     BACK SURGERY     HAND SURGERY Right    2024   LEFT HEART CATH AND CORONARY ANGIOGRAPHY N/A 12/13/2017   Procedure: LEFT HEART CATH AND CORONARY ANGIOGRAPHY;  Surgeon: Wonda Sharper, MD;  Location: [facility];  Service: Cardiovascular;  Laterality: N/A;   TOTAL HIP ARTHROPLASTY Left 02/23/2022   Procedure: TOTAL HIP ARTHROPLASTY ANTERIOR APPROACH;  Surgeon: Ernie Cough, MD;  Location: [facility];  Service: Orthopedics;  Laterality: Left;   Family History  Problem Relation Age of Onset   Diabetes Mother    Heart attack Mother 60       Died age 79   Aneurysm Father 30   Lung cancer Brother    Heart disease Son     Social History   Socioeconomic History   Marital status: Married    Spouse name: Not on file   Number of children: 8   Years of education: Not on file   Highest education level: Not on file  Occupational History   Occupation: Location manager   Occupation: retired  Tobacco Use   Smoking status: Former    Current packs/day: 0.00    Average packs/day: 0.8 packs/day for 40.0 years (30.0 ttl pk-yrs)    Types: Cigarettes    Start date: 79    Quit date: 2010    Years since quitting: 15.5   Smokeless tobacco: Never   Tobacco comments:    Quit 8 years ago.   Vaping Use   Vaping status: Never Used  Substance and Sexual Activity   Alcohol use: No   Drug use: Yes    Types: Oxycodone    Sexual activity: Yes  Other Topics Concern   Not on file  Social History Narrative   Lives at home with wife.     Social Drivers of Corporate investment banker Strain: Low Risk  (02/22/2024)   Overall Financial Resource Strain (CARDIA)    Difficulty of Paying Living Expenses: Not hard at all  Food Insecurity: No Food Insecurity (02/22/2024)   Hunger Vital Sign    Worried About Running Out of Food in the Last Year: Never true    Ran Out of Food in the Last Year: Never true  Transportation Needs: No Transportation Needs (02/22/2024)   PRAPARE - Administrator, Civil Service (Medical): No    Lack of Transportation (Non-Medical): No  Physical Activity: Insufficiently Active (02/22/2024)   Exercise Vital Sign    Days of Exercise per Week: 2 days    Minutes of Exercise per Session: 50 min  Stress: No Stress Concern Present (02/22/2024)   [employer] of Occupational Health - Occupational Stress Questionnaire    Feeling of Stress: Not at all  Social Connections: Socially Isolated (02/22/2024)   Social Connection and Isolation Panel    Frequency of Communication with Friends and Family: Once a week    Frequency of Social Gatherings with Friends and Family: Never    Attends  Religious Services: Never    Database administrator or Organizations: No    Attends Banker Meetings: Never    Marital Status: Married    Tobacco Counseling Counseling given: Not Answered Tobacco comments: Quit 8 years ago.     Clinical Intake:  Pre-visit preparation completed: Yes  Pain : No/denies pain     Nutritional Status: BMI > 30  Obese Nutritional Risks: None Diabetes: No  Lab Results  Component Value Date   HGBA1C 5.9 (  H) 10/24/2023   HGBA1C 5.9 (H) 06/06/2023   HGBA1C 5.7 (H) 02/17/2023     How often do you need to have someone help you when you read instructions, pamphlets, or other written materials from your doctor or pharmacy?: 1 - Never  Interpreter Needed?: No  Information entered by :: NAllen LPN   Activities of Daily Living     02/22/2024    8:28 AM  In your present state of health, do you have any difficulty performing the following activities:  Hearing? 0  Vision? 0  Difficulty concentrating or making decisions? 1  Walking or climbing stairs? 0  Dressing or bathing? 0  Doing errands, shopping? 0  Preparing Food and eating ? N  Using the Toilet? N  In the past six months, have you accidently leaked urine? N  Do you have problems with loss of bowel control? N  Managing your Medications? N  Managing your Finances? N  Housekeeping or managing your Housekeeping? N    Patient Care Team: Jarold Medici, MD as PCP - General (Internal Medicine) Lavona Agent, MD as PCP - Cardiology (Cardiology) [facility]  I have updated your Care Teams any recent Medical Services you may have received from other providers in the past year.     Assessment:   This is a routine wellness examination for Zylen.  Hearing/Vision screen Hearing Screening - Comments:: Denies hearing issues Vision Screening - Comments:: Regular eye exams, [facility]   Goals Addressed             This Visit's Progress    Patient  Stated       02/22/2024, denies goals       Depression Screen     02/22/2024    8:34 AM 10/24/2023    2:56 PM 06/06/2023    8:29 AM 02/17/2023    8:58 AM 10/21/2022   10:53 AM 02/03/2022    8:32 AM 01/08/2021    9:42 AM  PHQ 2/9 Scores  PHQ - 2 Score 0 0 0 6 0 0 0  PHQ- 9 Score 3 0 0 6       Fall Risk     02/22/2024    8:34 AM 10/24/2023    2:56 PM 09/08/2023   11:59 AM 02/17/2023    8:56 AM 10/21/2022   10:53 AM  Fall Risk   Falls in the past year? 0 0 0 1 0  Comment    fell out the tub   Number falls in past yr: 0 0  0 0  Injury with Fall? 0 0  0 0  Risk for fall due to : Medication side effect No Fall Risks  Medication side effect No Fall Risks  Follow up Falls evaluation completed;Falls prevention discussed Falls evaluation completed  Falls prevention discussed;Falls evaluation completed Falls evaluation completed    MEDICARE RISK AT HOME:  Medicare Risk at Home Any stairs in or around the home?: Yes If so, are there any without handrails?: No Home free of loose throw rugs in walkways, pet beds, electrical cords, etc?: Yes Adequate lighting in your home to reduce risk of falls?: Yes Life alert?: No Use of a cane, walker or w/c?: No Grab bars in the bathroom?: No Shower chair or bench in shower?: No Elevated toilet seat or a handicapped toilet?: No  TIMED UP AND GO:  Was the test performed?  Yes  Length of time to ambulate 10 feet: 5 sec Gait steady and fast  without use of assistive device  Cognitive Function: 6CIT completed        02/22/2024    8:36 AM 02/17/2023    8:59 AM 02/03/2022    8:33 AM 01/08/2021    9:43 AM 11/15/2019    9:21 AM  6CIT Screen  What Year? 0 points 0 points 0 points 0 points 0 points  What month? 0 points 0 points 0 points 0 points 0 points  What time? 0 points 0 points 0 points 0 points 0 points  Count back from 20 0 points 0 points 0 points 0 points 0 points  Months in reverse 4 points 4 points 4 points 4 points 4 points  Repeat phrase 4  points 8 points 4 points 10 points 2 points  Total Score 8 points 12 points 8 points 14 points 6 points    Immunizations Immunization History  Administered Date(s) Administered   PFIZER(Purple Top)SARS-COV-2 Vaccination 12/07/2019, 12/28/2019   PNEUMOCOCCAL CONJUGATE-20 08/31/2021   Tdap 10/21/2022    Screening Tests Health Maintenance  Topic Date Due   COVID-19 Vaccine (3 - Pfizer risk series) 03/09/2024 (Originally 01/25/2020)   Zoster Vaccines- Shingrix (1 of 2) 05/24/2024 (Originally 02/14/1970)   INFLUENZA VACCINE  03/02/2024   Medicare Annual Wellness (AWV)  02/21/2025   Fecal DNA (Cologuard)  11/01/2025   Colonoscopy  11/07/2033   Pneumococcal Vaccine: 50+ Years  Completed   Hepatitis C Screening  Completed   Hepatitis B Vaccines  Aged Out   HPV VACCINES  Aged Out   Meningococcal B Vaccine  Aged Out   Lung Cancer Screening  Discontinued   DTaP/Tdap/Td  Discontinued    Health Maintenance  There are no preventive care reminders to display for this patient.  Health Maintenance Items Addressed: Declines covid and shingles vaccine  Additional Screening:  Vision Screening: Recommended annual ophthalmology exams for early detection of glaucoma and other disorders of the eye. Would you like a referral to an eye doctor? No    Dental Screening: Recommended annual dental exams for proper oral hygiene  Community Resource Referral / Chronic Care Management: CRR required this visit?  No   CCM required this visit?  No   Plan:    I have personally reviewed and noted the following in the patient's chart:   Medical and social history Use of alcohol, tobacco or illicit drugs  Current medications and supplements including opioid prescriptions. Patient is currently taking opioid prescriptions. Information provided to patient regarding non-opioid alternatives. Patient advised to discuss non-opioid treatment plan with their provider. Functional ability and status Nutritional  status Physical activity Advanced directives List of other physicians Hospitalizations, surgeries, and ER visits in previous 12 months Vitals Screenings to include cognitive, depression, and falls Referrals and appointments  In addition, I have reviewed and discussed with patient certain preventive protocols, quality metrics, and best practice recommendations. A written personalized care plan for preventive services as well as general preventive health recommendations were provided to patient.   Ardella FORBES Dawn, LPN   2/76/7974   After Visit Summary: (In Person-Printed) AVS printed and given to the patient  Notes: Nothing significant to report at this time.

## 2024-02-28 ENCOUNTER — Other Ambulatory Visit: Payer: Self-pay | Admitting: Cardiology

## 2024-03-05 DIAGNOSIS — M542 Cervicalgia: Secondary | ICD-10-CM | POA: Diagnosis not present

## 2024-03-05 DIAGNOSIS — M961 Postlaminectomy syndrome, not elsewhere classified: Secondary | ICD-10-CM | POA: Diagnosis not present

## 2024-03-16 NOTE — Progress Notes (Unsigned)
 Cardiology Office Note:   Date:  03/19/2024  ID:  Raymond Welch, DOB 09-01-1950, MRN [id number] PCP: Jarold Medici, MD  [facility] Providers Cardiologist:  Lynwood Schilling, MD {  History of Present Illness:   Raymond Welch is a 73 y.o. male was referred by Jarold Medici, MD for evaluation of chest pain. We saw him in May 2018 for chest pain, he had a negative POET.   However he presented again in 2019 with continued pain and DOE.    Cath demonstrated mild diffuse CAD.  However, he does have a mildly reduced EF of 45%. I stopped his amlodipine and started spironolactone  for his mildly reduced ejection fraction.   At the last visit I changed him to Lipitor   His last EF was 55 - 60%.     Since I last saw him he has done OK.  The patient denies any new symptoms such as chest discomfort, neck or arm discomfort. There has been no new shortness of breath, PND or orthopnea. There have been no reported palpitations, presyncope or syncope.  Says he does a little yard work.  However, he is not exercising routinely.  He has gained about 15 pounds.  He has probably not been eating the best.  He is not having new shortness of breath, PND or orthopnea.  Is not having any new palpitations, presyncope or syncope.  His biggest complaint is just tiredness.  He does not have CPAP.  He does have sleep apnea he says they just have not been sending him the equipment.  He does snore and he wakes up somnolent.  ROS: As stated in the HPI and negative for all other systems.  Studies Reviewed:    EKG:     Sinus rhythm, rate 68, axis within normal limits, intervals within normal limits, no acute ST-T wave changes 10/24/2023    Risk Assessment/Calculations:              Physical Exam:   VS:  BP 96/72   Pulse 72   Ht 6' 2 (1.88 m)   Wt 283 lb (128.4 kg)   SpO2 92%   BMI 36.34 kg/m    Wt Readings from Last 3 Encounters:  03/19/24 283 lb (128.4 kg)  02/22/24 284 lb 3.2 oz (128.9 kg)  10/24/23 280 lb 12.8 oz  (127.4 kg)     GEN: Well nourished, well developed in no acute distress NECK: No JVD; No carotid bruits CARDIAC: RRR, no murmurs, rubs, gallops RESPIRATORY:  Clear to auscultation without rales, wheezing or rhonchi  ABDOMEN: Soft, non-tender, non-distended EXTREMITIES:  No edema; No deformity   ASSESSMENT AND PLAN:   DM:   His A1c was 5.9.  No change in therapy.   DYSLIPIDEMIA:    LDL was 60 with an HDL of 37.  No change in therapy.   MORBID OBESITY:   His weight has gone the wrong direction.  We had a long discussion about this.  I gave him specific instructions on diet and exercise.  SLEEP APNEA:   He has documented sleep apnea.  His STOP-BANG is 6.  He has not have the equipment and would at this point need new testing and a new prescription.  I will set him up for an in-lab sleep study and follow-up with Dr. Shlomo.  CARDIOMYOPATHY:  The last EF was 55 - 60% in 2021.  No change in therapy.  See below.  HYPOTENSION: His blood pressure is running kind of low.  We  have been using the maximal medical therapy for his reduced ejection fraction.  However, if he runs particularly low I have to back off.  Thankfully he is not symptomatic.  He will keep a blood pressure diary.    Follow up with me in 1 year  Signed, Lynwood Schilling, MD

## 2024-03-19 ENCOUNTER — Encounter: Payer: Self-pay | Admitting: Cardiology

## 2024-03-19 ENCOUNTER — Ambulatory Visit: Payer: Self-pay

## 2024-03-19 ENCOUNTER — Ambulatory Visit: Attending: Cardiology | Admitting: Cardiology

## 2024-03-19 VITALS — BP 96/72 | HR 72 | Ht 74.0 in | Wt 283.0 lb

## 2024-03-19 DIAGNOSIS — E118 Type 2 diabetes mellitus with unspecified complications: Secondary | ICD-10-CM

## 2024-03-19 DIAGNOSIS — G4733 Obstructive sleep apnea (adult) (pediatric): Secondary | ICD-10-CM | POA: Diagnosis not present

## 2024-03-19 DIAGNOSIS — E785 Hyperlipidemia, unspecified: Secondary | ICD-10-CM

## 2024-03-19 DIAGNOSIS — I420 Dilated cardiomyopathy: Secondary | ICD-10-CM

## 2024-03-19 NOTE — Telephone Encounter (Signed)
 Patient reports BP of 96/72 and dizziness with positional changes. Was seen by cardiologist today (see visit note). States the cardiologist advised him that he may need to decrease his BP medications and was advised to call PCP office. Pt takes metoprolol  and lisinopril-hydrochlorothiazide . Also has cialis  PRN on med list.   Reason for Disposition  [1] Systolic BP 90-110 AND [2] taking blood pressure medications AND [3] feeling weak or lightheaded  Answer Assessment - Initial Assessment Questions 1. BLOOD PRESSURE: What is your blood pressure? Did you take at least two measurements 5 minutes apart?     96/72 today at cardiologist office  2. ONSET: When did you take your blood pressure?     Today  4. HISTORY: Do you have a history of low blood pressure? What is your blood pressure normally?     Hx of HTN  5. MEDICINES: Are you taking any medicines for blood pressure? If Yes, ask: Have they been changed recently?     *No Answer* 6. PULSE RATE: Do you know what your pulse rate is?      *No Answer* 7. OTHER SYMPTOMS: Have you been sick recently? Have you had a recent injury?     *No Answer* 8. PREGNANCY: Is there any chance you are pregnant? When was your last menstrual period?     *No Answer*  Protocols used: Blood Pressure - Low-A-AH Message from [place] [facility] sent at 03/19/2024 12:48 PM EDT  Summary: low blood pressure   Patient would like a call back from Dr. Jarold' nurse to discuss his low blood pressure readings. Reading: 96/72. Heart Dr. Sanda maybe he should be taken off some of his blood pressure medications.  Callback #: [phone]  Symptoms: Fatigue. easily fall asleep, dizzy.

## 2024-03-19 NOTE — Patient Instructions (Addendum)
 Medication Instructions:  Your physician recommends that you continue on your current medications as directed. Please refer to the Current Medication list given to you today.  *If you need a refill on your cardiac medications before your next appointment, please call your pharmacy*  Lab Work: NONE If you have labs (blood work) drawn today and your tests are completely normal, you will receive your results only by: MyChart Message (if you have MyChart) OR A paper copy in the mail If you have any lab test that is abnormal or we need to change your treatment, we will call you to review the results.  Testing/Procedures: In lab, split night sleep study Your physician has recommended that you have a sleep study. This test records several body functions during sleep, including: brain activity, eye movement, oxygen and carbon dioxide blood levels, heart rate and rhythm, breathing rate and rhythm, the flow of air through your mouth and nose, snoring, body muscle movements, and chest and belly movement.   Follow-Up: At [facility], you and your health needs are our priority.  As part of our continuing mission to provide you with exceptional heart care, our providers are all part of one team.  This team includes your primary Cardiologist (physician) and Advanced Practice Providers or APPs (Physician Assistants and Nurse Practitioners) who all work together to provide you with the care you need, when you need it.  Your next appointment:   1 year  Provider:   Lavona, MD  We recommend signing up for the patient portal called MyChart.  Sign up information is provided on this After Visit Summary.  MyChart is used to connect with patients for Virtual Visits (Telemedicine).  Patients are able to view lab/test results, encounter notes, upcoming appointments, etc.  Non-urgent messages can be sent to your provider as well.   To learn more about what you can do with MyChart, go to  [email or web address].   Other Instructions You have been referred to Dr. Shlomo for sleep. Someone will reach out to you to make an appointment.  Blood pressure diary: take your blood pressure twice daily for 10 days and send us  the readings via MyChart.

## 2024-03-20 ENCOUNTER — Ambulatory Visit

## 2024-03-20 VITALS — BP 120/60 | Temp 98.5°F | Ht 74.0 in | Wt 283.0 lb

## 2024-03-20 DIAGNOSIS — R42 Dizziness and giddiness: Secondary | ICD-10-CM

## 2024-03-20 DIAGNOSIS — I10 Essential (primary) hypertension: Secondary | ICD-10-CM

## 2024-03-20 DIAGNOSIS — Z79899 Other long term (current) drug therapy: Secondary | ICD-10-CM | POA: Diagnosis not present

## 2024-03-20 NOTE — Progress Notes (Signed)
 Patient is in office today for a nurse visit for Blood Pressure Check. Patient blood pressure was 120/80, Patient No chest pain, No shortness of breath, No dyspnea on exertion, No orthopnea, No paroxysmal nocturnal dyspnea, No edema, No palpitations, No syncope. Patient currently taking losartan -hydrochlorothiazide  100-12.5mg , metoprolol  succinate 50mg , spironolactone  25mg .  patient reports he had dizziness yesterday that lasted for about 1 hour. Patient reports he had a low blood pressure yesterday of 96/72. Orthostatic Vitals for the past 48 hrs (Last 6 readings):  Orthostatic BP Orthostatic Pulse  03/20/24 0836 120/80 69  03/20/24 0841 116/60 74  03/20/24 0842 116/60 82   Per provider- take 1/2 tablet of spironolactone  - f/u 2 weeks.

## 2024-03-21 LAB — BMP8+EGFR
BUN/Creatinine Ratio: 14 (ref 10–24)
BUN: 17 mg/dL (ref 8–27)
CO2: 25 mmol/L (ref 20–29)
Calcium: 10 mg/dL (ref 8.6–10.2)
Chloride: 102 mmol/L (ref 96–106)
Creatinine, Ser: 1.23 mg/dL (ref 0.76–1.27)
Glucose: 96 mg/dL (ref 70–99)
Potassium: 4.1 mmol/L (ref 3.5–5.2)
Sodium: 140 mmol/L (ref 134–144)
eGFR: 62 mL/min/1.73 (ref >59)

## 2024-03-22 ENCOUNTER — Ambulatory Visit: Payer: Self-pay | Admitting: Internal Medicine

## 2024-03-30 ENCOUNTER — Other Ambulatory Visit: Payer: Self-pay | Admitting: Cardiology

## 2024-04-03 ENCOUNTER — Ambulatory Visit: Payer: Self-pay

## 2024-04-06 ENCOUNTER — Telehealth: Payer: Self-pay

## 2024-04-06 NOTE — Telephone Encounter (Signed)
 Per [facility] provider portal no prior authorization is needed for Split Night Sleep Study Valid Dates: 05/21/2024-08/01/2024  You are not required to submit a notification/prior authorization based on the information provided. If you have general questions about the prior authorization requirements, visit [email or web address] > Clinician Resources > Advance and Admission Notification Requirements. The number above acknowledges your notification. Please write this reference number down for future reference. If you would like to request an organization determination, please call us  at [phone]. Decision ID #: [id number]

## 2024-04-10 ENCOUNTER — Ambulatory Visit

## 2024-04-10 VITALS — BP 110/70 | HR 78 | Temp 97.6°F | Ht 74.0 in | Wt 283.0 lb

## 2024-04-10 DIAGNOSIS — Z2821 Immunization not carried out because of patient refusal: Secondary | ICD-10-CM

## 2024-04-10 DIAGNOSIS — I10 Essential (primary) hypertension: Secondary | ICD-10-CM

## 2024-04-10 NOTE — Progress Notes (Signed)
 Patient presents today for a bpc. Patient reports compliance with his meds. Patient reports he is taking losartan  hydrochlorothiazide  100-12.5mg , metoprolol  succinate 50mg  and spironolactone  25mg  in the mornings. Patient reports he is no longer dizzy. Patient's bp was 110/70 P78. Patient has a follow up with you on 04/25/24. Patient was advised to continue with her current medications. Patient was offered flu vaccine, he declined.    BP Readings from Last 3 Encounters:  03/20/24 120/60  03/19/24 96/72  02/22/24 110/78

## 2024-04-10 NOTE — Patient Instructions (Signed)
 Hypertension, Adult Hypertension is another name for high blood pressure. High blood pressure forces your heart to work harder to pump blood. This can cause problems over time. There are two numbers in a blood pressure reading. There is a top number (systolic) over a bottom number (diastolic). It is best to have a blood pressure that is below 120/80. What are the causes? The cause of this condition is not known. Some other conditions can lead to high blood pressure. What increases the risk? Some lifestyle factors can make you more likely to develop high blood pressure: Smoking. Not getting enough exercise or physical activity. Being overweight. Having too much fat, sugar, calories, or salt (sodium) in your diet. Drinking too much alcohol. Other risk factors include: Having any of these conditions: Heart disease. Diabetes. High cholesterol. Kidney disease. Obstructive sleep apnea. Having a family history of high blood pressure and high cholesterol. Age. The risk increases with age. Stress. What are the signs or symptoms? High blood pressure may not cause symptoms. Very high blood pressure (hypertensive crisis) may cause: Headache. Fast or uneven heartbeats (palpitations). Shortness of breath. Nosebleed. Vomiting or feeling like you may vomit (nauseous). Changes in how you see. Very bad chest pain. Feeling dizzy. Seizures. How is this treated? This condition is treated by making healthy lifestyle changes, such as: Eating healthy foods. Exercising more. Drinking less alcohol. Your doctor may prescribe medicine if lifestyle changes do not help enough and if: Your top number is above 130. Your bottom number is above 80. Your personal target blood pressure may vary. Follow these instructions at home: Eating and drinking  If told, follow the DASH eating plan. To follow this plan: Fill one half of your plate at each meal with fruits and vegetables. Fill one fourth of your plate  at each meal with whole grains. Whole grains include whole-wheat pasta, brown rice, and whole-grain bread. Eat or drink low-fat dairy products, such as skim milk or low-fat yogurt. Fill one fourth of your plate at each meal with low-fat (lean) proteins. Low-fat proteins include fish, chicken without skin, eggs, beans, and tofu. Avoid fatty meat, cured and processed meat, or chicken with skin. Avoid pre-made or processed food. Limit the amount of salt in your diet to less than 1,500 mg each day. Do not drink alcohol if: Your doctor tells you not to drink. You are pregnant, may be pregnant, or are planning to become pregnant. If you drink alcohol: Limit how much you have to: 0-1 drink a day for women. 0-2 drinks a day for men. Know how much alcohol is in your drink. In the [place] one drink equals one 12 oz bottle of beer (355 mL), one 5 oz glass of wine (148 mL), or one 1 oz glass of hard liquor (44 mL). Lifestyle  Work with your doctor to stay at a healthy weight or to lose weight. Ask your doctor what the best weight is for you. Get at least 30 minutes of exercise that causes your heart to beat faster (aerobic exercise) most days of the week. This may include walking, swimming, or biking. Get at least 30 minutes of exercise that strengthens your muscles (resistance exercise) at least 3 days a week. This may include lifting weights or doing Pilates. Do not smoke or use any products that contain nicotine or tobacco. If you need help quitting, ask your doctor. Check your blood pressure at home as told by your doctor. Keep all follow-up visits. Medicines Take over-the-counter and prescription medicines  only as told by your doctor. Follow directions carefully. Do not skip doses of blood pressure medicine. The medicine does not work as well if you skip doses. Skipping doses also puts you at risk for problems. Ask your doctor about side effects or reactions to medicines that you should watch  for. Contact a doctor if: You think you are having a reaction to the medicine you are taking. You have headaches that keep coming back. You feel dizzy. You have swelling in your ankles. You have trouble with your vision. Get help right away if: You get a very bad headache. You start to feel mixed up (confused). You feel weak or numb. You feel faint. You have very bad pain in your: Chest. Belly (abdomen). You vomit more than once. You have trouble breathing. These symptoms may be an emergency. Get help right away. Call 911. Do not wait to see if the symptoms will go away. Do not drive yourself to the hospital. Summary Hypertension is another name for high blood pressure. High blood pressure forces your heart to work harder to pump blood. For most people, a normal blood pressure is less than 120/80. Making healthy choices can help lower blood pressure. If your blood pressure does not get lower with healthy choices, you may need to take medicine. This information is not intended to replace advice given to you by your health care provider. Make sure you discuss any questions you have with your health care provider. Document Revised: 05/07/2021 Document Reviewed: 05/07/2021 Elsevier Patient Education  2024 [employer].

## 2024-04-25 ENCOUNTER — Encounter: Payer: Self-pay | Admitting: Internal Medicine

## 2024-04-25 ENCOUNTER — Ambulatory Visit: Admitting: Internal Medicine

## 2024-04-25 VITALS — BP 124/80 | HR 77 | Temp 98.4°F | Ht 74.0 in | Wt 293.4 lb

## 2024-04-25 DIAGNOSIS — N182 Chronic kidney disease, stage 2 (mild): Secondary | ICD-10-CM | POA: Diagnosis not present

## 2024-04-25 DIAGNOSIS — E66812 Obesity, class 2: Secondary | ICD-10-CM

## 2024-04-25 DIAGNOSIS — M25572 Pain in left ankle and joints of left foot: Secondary | ICD-10-CM

## 2024-04-25 DIAGNOSIS — I25708 Atherosclerosis of coronary artery bypass graft(s), unspecified, with other forms of angina pectoris: Secondary | ICD-10-CM

## 2024-04-25 DIAGNOSIS — G8929 Other chronic pain: Secondary | ICD-10-CM

## 2024-04-25 DIAGNOSIS — I700 Atherosclerosis of aorta: Secondary | ICD-10-CM | POA: Diagnosis not present

## 2024-04-25 DIAGNOSIS — M25571 Pain in right ankle and joints of right foot: Secondary | ICD-10-CM | POA: Diagnosis not present

## 2024-04-25 DIAGNOSIS — I130 Hypertensive heart and chronic kidney disease with heart failure and stage 1 through stage 4 chronic kidney disease, or unspecified chronic kidney disease: Secondary | ICD-10-CM | POA: Diagnosis not present

## 2024-04-25 DIAGNOSIS — R7303 Prediabetes: Secondary | ICD-10-CM

## 2024-04-25 DIAGNOSIS — Z6837 Body mass index (BMI) 37.0-37.9, adult: Secondary | ICD-10-CM

## 2024-04-25 DIAGNOSIS — E559 Vitamin D deficiency, unspecified: Secondary | ICD-10-CM

## 2024-04-25 DIAGNOSIS — F5104 Psychophysiologic insomnia: Secondary | ICD-10-CM

## 2024-04-25 MED ORDER — TRAZODONE HCL 50 MG PO TABS: 50.0000 mg | ORAL_TABLET | Freq: Every day | ORAL | 1 refills | Status: DC

## 2024-04-25 NOTE — Patient Instructions (Signed)
 Hypertension, Adult Hypertension is another name for high blood pressure. High blood pressure forces your heart to work harder to pump blood. This can cause problems over time. There are two numbers in a blood pressure reading. There is a top number (systolic) over a bottom number (diastolic). It is best to have a blood pressure that is below 120/80. What are the causes? The cause of this condition is not known. Some other conditions can lead to high blood pressure. What increases the risk? Some lifestyle factors can make you more likely to develop high blood pressure: Smoking. Not getting enough exercise or physical activity. Being overweight. Having too much fat, sugar, calories, or salt (sodium) in your diet. Drinking too much alcohol. Other risk factors include: Having any of these conditions: Heart disease. Diabetes. High cholesterol. Kidney disease. Obstructive sleep apnea. Having a family history of high blood pressure and high cholesterol. Age. The risk increases with age. Stress. What are the signs or symptoms? High blood pressure may not cause symptoms. Very high blood pressure (hypertensive crisis) may cause: Headache. Fast or uneven heartbeats (palpitations). Shortness of breath. Nosebleed. Vomiting or feeling like you may vomit (nauseous). Changes in how you see. Very bad chest pain. Feeling dizzy. Seizures. How is this treated? This condition is treated by making healthy lifestyle changes, such as: Eating healthy foods. Exercising more. Drinking less alcohol. Your doctor may prescribe medicine if lifestyle changes do not help enough and if: Your top number is above 130. Your bottom number is above 80. Your personal target blood pressure may vary. Follow these instructions at home: Eating and drinking  If told, follow the DASH eating plan. To follow this plan: Fill one half of your plate at each meal with fruits and vegetables. Fill one fourth of your plate  at each meal with whole grains. Whole grains include whole-wheat pasta, brown rice, and whole-grain bread. Eat or drink low-fat dairy products, such as skim milk or low-fat yogurt. Fill one fourth of your plate at each meal with low-fat (lean) proteins. Low-fat proteins include fish, chicken without skin, eggs, beans, and tofu. Avoid fatty meat, cured and processed meat, or chicken with skin. Avoid pre-made or processed food. Limit the amount of salt in your diet to less than 1,500 mg each day. Do not drink alcohol if: Your doctor tells you not to drink. You are pregnant, may be pregnant, or are planning to become pregnant. If you drink alcohol: Limit how much you have to: 0-1 drink a day for women. 0-2 drinks a day for men. Know how much alcohol is in your drink. In the [place] one drink equals one 12 oz bottle of beer (355 mL), one 5 oz glass of wine (148 mL), or one 1 oz glass of hard liquor (44 mL). Lifestyle  Work with your doctor to stay at a healthy weight or to lose weight. Ask your doctor what the best weight is for you. Get at least 30 minutes of exercise that causes your heart to beat faster (aerobic exercise) most days of the week. This may include walking, swimming, or biking. Get at least 30 minutes of exercise that strengthens your muscles (resistance exercise) at least 3 days a week. This may include lifting weights or doing Pilates. Do not smoke or use any products that contain nicotine or tobacco. If you need help quitting, ask your doctor. Check your blood pressure at home as told by your doctor. Keep all follow-up visits. Medicines Take over-the-counter and prescription medicines  only as told by your doctor. Follow directions carefully. Do not skip doses of blood pressure medicine. The medicine does not work as well if you skip doses. Skipping doses also puts you at risk for problems. Ask your doctor about side effects or reactions to medicines that you should watch  for. Contact a doctor if: You think you are having a reaction to the medicine you are taking. You have headaches that keep coming back. You feel dizzy. You have swelling in your ankles. You have trouble with your vision. Get help right away if: You get a very bad headache. You start to feel mixed up (confused). You feel weak or numb. You feel faint. You have very bad pain in your: Chest. Belly (abdomen). You vomit more than once. You have trouble breathing. These symptoms may be an emergency. Get help right away. Call 911. Do not wait to see if the symptoms will go away. Do not drive yourself to the hospital. Summary Hypertension is another name for high blood pressure. High blood pressure forces your heart to work harder to pump blood. For most people, a normal blood pressure is less than 120/80. Making healthy choices can help lower blood pressure. If your blood pressure does not get lower with healthy choices, you may need to take medicine. This information is not intended to replace advice given to you by your health care provider. Make sure you discuss any questions you have with your health care provider. Document Revised: 05/07/2021 Document Reviewed: 05/07/2021 Elsevier Patient Education  2024 [employer].

## 2024-04-25 NOTE — Progress Notes (Signed)
 I,Victoria T Emmitt, CMA,acting as a Neurosurgeon for Catheryn LOISE Slocumb, MD.,have documented all relevant documentation on the behalf of Catheryn LOISE Slocumb, MD,as directed by  Catheryn LOISE Slocumb, MD while in the presence of Catheryn LOISE Slocumb, MD.  Subjective:  Patient ID: Raymond Welch , male    DOB: [birth year] , 73 y.o.   MRN: [id number]  Chief Complaint  Patient presents with   Hypertension    Patient presents today for bp & predm follow up. He reports compliance with medications. Denies headache, chest pain & sob.   Prediabetes    HPI Discussed the use of AI scribe software for clinical note transcription with the patient, who gave verbal consent to proceed.  History of Present Illness Raymond Welch is a 73 year old male with prediabetes and hypertension who presents for a blood pressure and prediabetes check and to discuss sleep issues.  He experiences difficulty with sleep, characterized by initial sleep onset followed by difficulty returning to sleep after waking. He is not using his CPAP machine due to a lack of equipment supply and is scheduled for a sleep study next month. Nocturia is absent.  His hypertension is managed with losartan /hydrochlorothiazide  100/12.5 mg, metoprolol  50 mg XL daily, and spironolactone  25 mg.  He experiences aching in his legs and ankles, particularly after walking. His ankles feel sore and as if there is too much pressure on them after prolonged walking, leading to reduced walking activity.  He reports a weight gain of 10 pounds since August, now weighing 293 pounds, attributing this to decreased physical activity and occasional soda consumption. He drinks plenty of water throughout the day.  He takes atorvastatin  40 mg for cholesterol and oxycodone  for pain management. He previously took Miralax  but no longer uses it. He also takes vitamin D  supplements for a past deficiency, with plans to have his vitamin D  levels checked again.  No frequent urination at night, shortness  of breath, or chest pain. He reports occasional windedness.   HPI   Past Medical History:  Diagnosis Date   Arthritis    Cardiomyopathy (HCC)    Diabetes mellitus without complication (HCC)    Hypertension    Low back pain    Myocardial infarction (HCC)    Osteoporosis    Pure hypercholesterolemia    Sleep apnea      Family History  Problem Relation Age of Onset   Diabetes Mother    Heart attack Mother 21       Died age 13   Aneurysm Father 110   Lung cancer Brother    Heart disease Son      Current Outpatient Medications:    atorvastatin  (LIPITOR) 40 MG tablet, TAKE 1 TABLET BY MOUTH EVERY DAY, Disp: 90 tablet, Rfl: 3   losartan -hydrochlorothiazide  (HYZAAR) 100-12.5 MG tablet, TAKE 1 TABLET BY MOUTH DAILY, Disp: 100 tablet, Rfl: 2   metoprolol  succinate (TOPROL -XL) 50 MG 24 hr tablet, TAKE 1 TABLET BY MOUTH DAILY  WITH OR IMMEDIATELY FOLLOWING A  MEAL, Disp: 100 tablet, Rfl: 2   oxyCODONE  10 MG TABS, Take 1 tablet (10 mg total) by mouth every 4 (four) hours as needed for severe pain (pain score 7-10)., Disp: 42 tablet, Rfl: 0   semaglutide -weight management (WEGOVY ) 0.5 MG/0.5ML SOAJ SQ injection, Inject 0.5 mg into the skin once a week., Disp: 2 mL, Rfl: 0   spironolactone  (ALDACTONE ) 25 MG tablet, TAKE 1 TABLET (25 MG TOTAL) BY MOUTH DAILY., Disp: 90 tablet, Rfl: 3  tadalafil  (CIALIS ) 20 MG tablet, TAKE 1 TABLET BY MOUTH ONCE DAILY AS NEEDED FOR ERECTILE DYSFUNCTION, Disp: 30 tablet, Rfl: 1   traZODone  (DESYREL ) 50 MG tablet, Take 1 tablet (50 mg total) by mouth at bedtime., Disp: 30 tablet, Rfl: 1   acetaminophen  (TYLENOL ) 500 MG tablet, Take 2 tablets (1,000 mg total) by mouth every 6 (six) hours. (Patient not taking: Reported on 04/25/2024), Disp: 30 tablet, Rfl: 0   methocarbamol  (ROBAXIN ) 500 MG tablet, Take 1 tablet (500 mg total) by mouth 2 (two) times daily. (Patient not taking: Reported on 04/25/2024), Disp: 8 tablet, Rfl: 0   polyethylene glycol (MIRALAX  / GLYCOLAX )  17 g packet, Take 17 g by mouth daily as needed for mild constipation. (Patient not taking: Reported on 04/25/2024), Disp: 14 each, Rfl: 0   No Known Allergies   Review of Systems  Constitutional: Negative.   HENT: Negative.    Respiratory: Negative.    Cardiovascular: Negative.   Endocrine: Negative.   Skin: Negative.   Allergic/Immunologic: Negative.   Neurological: Negative.   Hematological: Negative.      Today's Vitals   04/25/24 0924  BP: 124/80  Pulse: 77  Temp: 98.4 F (36.9 C)  SpO2: 98%  Weight: 293 lb 6.4 oz (133.1 kg)  Height: 6' 2 (1.88 m)   Body mass index is 37.67 kg/m.  Wt Readings from Last 3 Encounters:  04/25/24 293 lb 6.4 oz (133.1 kg)  04/10/24 283 lb (128.4 kg)  03/20/24 283 lb (128.4 kg)     Objective:  Physical Exam Vitals and nursing note reviewed.  Constitutional:      Appearance: Normal appearance. He is obese.  HENT:     Head: Normocephalic and atraumatic.  Eyes:     Extraocular Movements: Extraocular movements intact.  Cardiovascular:     Rate and Rhythm: Normal rate and regular rhythm.     Heart sounds: Normal heart sounds.  Pulmonary:     Effort: Pulmonary effort is normal.     Breath sounds: Normal breath sounds.  Musculoskeletal:     Cervical back: Normal range of motion.  Skin:    General: Skin is warm.  Neurological:     General: No focal deficit present.     Mental Status: He is alert.  Psychiatric:        Mood and Affect: Mood normal.         Assessment And Plan:  Hypertensive heart and renal disease with renal failure, stage 1 through stage 4 or unspecified chronic kidney disease, with heart failure (HCC) Assessment & Plan: Chronic, controlled. He will continue with losartan /hydrochlorothiazide  100/12.5mg  daily. Encouraged to follow heart healthy lifestyle and low sodium diet. He will f/u in four to six months for re-evaluation.   Orders: -     Lipid panel -     CBC -     Hepatic function panel  Aortic  atherosclerosis Assessment & Plan: Chronic, LDL goal is less than 70.  He is encouraged to follow heart healthy lifestyle. Clean eating, regular exercise and stress management are all a part of this lifestyle. He is also encouraged to continue with statin therapy. He will c/w atorvastatin  40mg  daily.    Orders: -     Lipid panel -     Hepatic function panel  Atherosclerosis of coronary artery bypass graft of native heart with stable angina pectoris Assessment & Plan: Chronic, CT scan of lungs showed plaque in heart arteries. No current symptoms of angina or shortness of  breath.  I will see if his insurance will cover Wegovy .  We discussed the use of Wegovy  for CAD. He denies family/personal h/o thyroid  cancer. I will check with his insurance to see if Wegovy  is covered given his h/o CAD.  I will have staff start PA to see if it is covered. .     Chronic renal disease, stage II Assessment & Plan: Chronic, he is encouraged to stay well hydrated, avoid NSAIDs and keep BP controlled to prevent progression of CKD.     Prediabetes Assessment & Plan: Discussed risk factors for diabetes, including weight gain and occasional soda consumption. Informed that soda consumption increases diabetes risk by 80%. - Perform blood work to monitor glucose levels.  Orders: -     Hemoglobin A1c  Chronic insomnia Assessment & Plan: New onset insomnia with difficulty returning to sleep after waking. Not using CPAP due to lack of equipment. Scheduled for a sleep study next month. - Prescribe trazodone  to be taken at night for sleep. - Schedule follow-up in 6 weeks to assess effectiveness of trazodone . - Perform repeat EKG after starting trazodone  to monitor for changes.   Chronic pain of both ankles Assessment & Plan: Bilateral ankle pain associated with walking, possibly due to increased weight and reduced activity. - Check vitamin D  levels. - Consider x-rays of ankles if pain persists after  consultation with pain doctor.   Vitamin D  deficiency disease -     VITAMIN D  25 Hydroxy (Vit-D Deficiency, Fractures)  Class 2 severe obesity due to excess calories with serious comorbidity and body mass index (BMI) of 37.0 to 37.9 in adult Assessment & Plan: Weight gain to 293 lbs from 283 lbs in August. Reduced physical activity and increased weight contributing to bilateral ankle pain. Discussed potential for weight loss medication pending insurance approval. - Initiate process to obtain insurance approval for weight loss medication.   Other orders -     traZODone  HCl; Take 1 tablet (50 mg total) by mouth at bedtime.  Dispense: 30 tablet; Refill: 1 -     Wegovy ; Inject 0.5 mg into the skin once a week.  Dispense: 2 mL; Refill: 0    Return in 6 weeks (on 06/06/2024), or med check - f/u trazodone , needs f/u ekg.  Patient was given opportunity to ask questions. Patient verbalized understanding of the plan and was able to repeat key elements of the plan. All questions were answered to their satisfaction.   I, Catheryn LOISE Slocumb, MD, have reviewed all documentation for this visit. The documentation on 04/25/24 for the exam, diagnosis, procedures, and orders are all accurate and complete.   IF YOU HAVE BEEN REFERRED TO A SPECIALIST, IT MAY TAKE 1-2 WEEKS TO SCHEDULE/PROCESS THE REFERRAL. IF YOU HAVE NOT HEARD FROM US /SPECIALIST IN TWO WEEKS, PLEASE GIVE US  A CALL AT [phone].   THE PATIENT IS ENCOURAGED TO PRACTICE SOCIAL DISTANCING DUE TO THE COVID-19 PANDEMIC.

## 2024-04-26 LAB — HEPATIC FUNCTION PANEL
ALT: 17 IU/L (ref 0–44)
AST: 24 IU/L (ref 0–40)
Albumin: 4.1 g/dL (ref 3.8–4.8)
Alkaline Phosphatase: 54 IU/L (ref 47–123)
Bilirubin Total: 0.5 mg/dL (ref 0.0–1.2)
Bilirubin, Direct: 0.18 mg/dL (ref 0.00–0.40)
Total Protein: 6.9 g/dL (ref 6.0–8.5)

## 2024-04-26 LAB — CBC
Hematocrit: 38.4 % (ref 37.5–51.0)
Hemoglobin: 12.5 g/dL — ABNORMAL LOW (ref 13.0–17.7)
MCH: 30.9 pg (ref 26.6–33.0)
MCHC: 32.6 g/dL (ref 31.5–35.7)
MCV: 95 fL (ref 79–97)
Platelets: 169 x10E3/uL (ref 150–450)
RBC: 4.05 x10E6/uL — ABNORMAL LOW (ref 4.14–5.80)
RDW: 11.9 % (ref 11.6–15.4)
WBC: 3.8 x10E3/uL (ref 3.4–10.8)

## 2024-04-26 LAB — LIPID PANEL
Chol/HDL Ratio: 3.1 ratio (ref 0.0–5.0)
Cholesterol, Total: 126 mg/dL (ref 100–199)
HDL: 41 mg/dL (ref >39)
LDL Chol Calc (NIH): 69 mg/dL (ref 0–99)
Triglycerides: 81 mg/dL (ref 0–149)
VLDL Cholesterol Cal: 16 mg/dL (ref 5–40)

## 2024-04-26 LAB — HEMOGLOBIN A1C
Est. average glucose Bld gHb Est-mCnc: 114 mg/dL
Hgb A1c MFr Bld: 5.6 % (ref 4.8–5.6)

## 2024-04-26 LAB — VITAMIN D 25 HYDROXY (VIT D DEFICIENCY, FRACTURES): Vit D, 25-Hydroxy: 50.4 ng/mL (ref 30.0–100.0)

## 2024-04-28 ENCOUNTER — Other Ambulatory Visit: Payer: Self-pay | Admitting: Internal Medicine

## 2024-04-28 ENCOUNTER — Ambulatory Visit: Payer: Self-pay | Admitting: Internal Medicine

## 2024-04-28 DIAGNOSIS — F5104 Psychophysiologic insomnia: Secondary | ICD-10-CM | POA: Insufficient documentation

## 2024-04-28 DIAGNOSIS — I25708 Atherosclerosis of coronary artery bypass graft(s), unspecified, with other forms of angina pectoris: Secondary | ICD-10-CM | POA: Insufficient documentation

## 2024-04-28 DIAGNOSIS — G8929 Other chronic pain: Secondary | ICD-10-CM | POA: Insufficient documentation

## 2024-04-28 MED ORDER — WEGOVY 0.5 MG/0.5ML ~~LOC~~ SOAJ
0.5000 mg | SUBCUTANEOUS | 0 refills | Status: DC
Start: 2024-04-28 — End: 2024-05-31

## 2024-04-28 NOTE — Assessment & Plan Note (Signed)
 New onset insomnia with difficulty returning to sleep after waking. Not using CPAP due to lack of equipment. Scheduled for a sleep study next month. - Prescribe trazodone  to be taken at night for sleep. - Schedule follow-up in 6 weeks to assess effectiveness of trazodone . - Perform repeat EKG after starting trazodone  to monitor for changes.

## 2024-04-28 NOTE — Assessment & Plan Note (Signed)
 Chronic, LDL goal is less than 70.  He is encouraged to follow heart healthy lifestyle. Clean eating, regular exercise and stress management are all a part of this lifestyle. He is also encouraged to continue with statin therapy. He will c/w atorvastatin 40mg  daily.

## 2024-04-28 NOTE — Assessment & Plan Note (Addendum)
 Chronic, CT scan of lungs showed plaque in heart arteries. No current symptoms of angina or shortness of breath.  I will see if his insurance will cover Wegovy .  We discussed the use of Wegovy  for CAD. He denies family/personal h/o thyroid  cancer. I will check with his insurance to see if Wegovy  is covered given his h/o CAD.  I will have staff start PA to see if it is covered. SABRA

## 2024-04-28 NOTE — Assessment & Plan Note (Signed)
 Chronic, controlled. He will continue with losartan /hydrochlorothiazide  100/12.5mg  daily. Encouraged to follow heart healthy lifestyle and low sodium diet. He will f/u in four to six months for re-evaluation.

## 2024-04-28 NOTE — Assessment & Plan Note (Signed)
 Chronic, he is encouraged to stay well hydrated, avoid NSAIDs and keep BP controlled to prevent progression of CKD.

## 2024-04-28 NOTE — Assessment & Plan Note (Signed)
 Weight gain to 293 lbs from 283 lbs in August. Reduced physical activity and increased weight contributing to bilateral ankle pain. Discussed potential for weight loss medication pending insurance approval. - Initiate process to obtain insurance approval for weight loss medication.

## 2024-04-28 NOTE — Assessment & Plan Note (Signed)
 Bilateral ankle pain associated with walking, possibly due to increased weight and reduced activity. - Check vitamin D  levels. - Consider x-rays of ankles if pain persists after consultation with pain doctor.

## 2024-04-28 NOTE — Assessment & Plan Note (Signed)
 Discussed risk factors for diabetes, including weight gain and occasional soda consumption. Informed that soda consumption increases diabetes risk by 80%. - Perform blood work to monitor glucose levels.

## 2024-05-21 ENCOUNTER — Other Ambulatory Visit: Payer: Self-pay | Admitting: Internal Medicine

## 2024-05-24 ENCOUNTER — Ambulatory Visit (HOSPITAL_BASED_OUTPATIENT_CLINIC_OR_DEPARTMENT_OTHER): Attending: Cardiology | Admitting: Cardiology

## 2024-05-24 DIAGNOSIS — G4736 Sleep related hypoventilation in conditions classified elsewhere: Secondary | ICD-10-CM | POA: Diagnosis not present

## 2024-05-24 DIAGNOSIS — G4733 Obstructive sleep apnea (adult) (pediatric): Secondary | ICD-10-CM | POA: Diagnosis not present

## 2024-05-29 NOTE — Procedures (Signed)
 Indications for Polysomnography The patient is a 73 year old Male who is 6' 2 and weighs 280.0 lbs.  His BMI equals 35.9.  A diagnostic polysomnogram was performed to evaluate for sleep apnea.  After 122.5 minutes of sleep time the patient exhibited sufficient respiratory events  qualifying him for a CPAP trial which was then initiated.  No MedicationsNo Data. Polysomnogram Data A full night polysomnogram was performed recording the standard physiologic parameters including EEG, EOG, EMG, EKG, nasal and oral airflow.  Respiratory parameters of chest and abdominal movements are recorded with [place] motion transducers.   Oxygen saturation was recorded by pulse oximetry.  Sleep Architecture The total recording time of the diagnostic portion of the study was 247.1 minutes.  The total sleep time was 122.5 minutes.  During the diagnostic portion of the study, the patient spent 40.8% of total sleep time in Stage N1, 51.8% in Stage N2, 0.0% in  Stages N3, and 7.3% in REM.   Sleep latency was 19.1 minutes.  REM latency was 81.5 minutes.  Sleep Efficiency was 49.6%.  Wake after Sleep Onset time was 105.5 minutes.  At 01:43:00 AM the patient was placed on PAP treatment and was titrated at pressures ranging from 5 cm/H20 up to 17 cm/H20.The total recording time of the treatment portion of the study was 199.9 minutes.  The total sleep time was 130.5 minutes.  During  the treatment portion of the study, the patient spent 28.7% of total sleep time in Stage N1, 41.0% in Stage N2, 0.0% in Stages N3, and 30.3% in REM.   Sleep latency was 26.0 minutes.  REM latency was 61.5 minutes.  Sleep Efficiency was 65.3%.  Wake after  Sleep Onset time was 43.0 minutes.  Respiratory Events During the diagnostic portion of the study, the polysomnogram revealed a presence of 5 obstructive, 0 central, and mixed 0 apneas resulting in an Apnea index of 2.4 events per hour.  There were 110 hypopneas (GreaterEqual to3%  desaturation and/or  arousal) resulting in an Apnea\Hypopnea Index (AHI GreaterEqual to3% desaturation and/or arousal) of 56.3 events per hour.  There were 89 hypopneas (GreaterEqual to4% desaturation) resulting in an Apnea\Hypopnea Index (AHI GreaterEqual to4% desaturation)  of 46.0 events per hour.  There were 3 Respiratory Effort Related Arousals resulting in a RERA index of 1.5 events per hour. The Respiratory Disturbance Index is 57.8 events per hour.  The snore index was 0 events per hour.  Mean oxygen saturation was  91.7%.  The lowest oxygen saturation during sleep was 62.0%.  Time spent LessEqual to88% oxygen saturation was  minutes ().  During the treatment portion of the study, the polysomnogram revealed a presence of 5 obstructive, 4 centrals, and 0 mixed apneas resulting in an Apnea index of 4.1 events per hour.  There were 68 hypopneas (GreaterEqual to3% desaturation and/or arousal)  resulting in an Apnea\Hypopnea Index (AHI GreaterEqual to3% desaturation and/or arousal) of 35.4 events per hour.  There were 59 hypopneas (GreaterEqual to4% desaturation) resulting in an Apnea\Hypopnea Index (AHI GreaterEqual to4% desaturation) of 31.3  events per hour.  There were 4 Respiratory Effort Related Arousals resulting in a RERA index of 1.8 events per hour. The Respiratory Disturbance Index is 37.2 events per hour.  The snore index was 0 events per hour.  Mean oxygen saturation was 92.8%.   The lowest oxygen saturation during sleep was 77.0%.  Time spent LessEqual to88% oxygen saturation was  minutes ().  Limb Activity During the diagnostic portion of the study, there were  4 limb movements recorded.  Of this total, 4 were classified as PLMs.  Of the PLMs, 0 were associated with arousals.  The Limb Movement index was 2.0 per hour while the PLM index was 2.0 per hour.  During the treatment portion of the study, there were 44 limb movements recorded.  Of this total, 44 were classified as PLMs.  Of the  PLMs, 1 were associated with arousals.  The Limb Movement index was 20.2 per hour while the PLM index was 20.2 per hour.  Cardiac Summary During the diagnostic portion of the study, the average pulse rate was 69.5 bpm.  The minimum pulse rate was 50.0 bpm while the maximum pulse rate was 93.0 bpm.  During the treatment portion of the study, the average pulse rate was 63.2 bpm.  The minimum pulse rate was 42.0 bpm while the maximum pulse rate was 85.0 bpm.   Diagnosis: Severe obstructive sleep apnea Nocturnal hypoxemia Unsuccessful CPAP titration due  to ongoing respiratory events  Recommendations: 1. Recommend a full night in-lab BiPAP titration 2. Healthy sleep recommendations include:  adequate nightly sleep (normal 7-9 hrs/night), avoidance of caffeine after noon and alcohol near bedtime, and maintaining a sleep environment that is cool, dark and quiet. 3. Weight loss for overweight patients is recommended.  Even modest amounts of weight loss can significantly improve the severity of sleep apnea. 4.  Snoring recommendations include:  weight loss where appropriate, side sleeping, and avoidance of alcohol before bed. 5. Operation of motor vehicle should be avoided when sleepy.    This study was personally reviewed and electronically signed by: Dr. Wilbert Bihari Accredited Board Certified in Sleep Medicine Date/Time: 05/29/2024 10:33 PM

## 2024-05-31 ENCOUNTER — Other Ambulatory Visit: Payer: Self-pay | Admitting: Internal Medicine

## 2024-06-03 ENCOUNTER — Ambulatory Visit: Payer: Self-pay | Admitting: Cardiology

## 2024-06-03 DIAGNOSIS — I420 Dilated cardiomyopathy: Secondary | ICD-10-CM

## 2024-06-03 DIAGNOSIS — I493 Ventricular premature depolarization: Secondary | ICD-10-CM

## 2024-06-03 DIAGNOSIS — G4733 Obstructive sleep apnea (adult) (pediatric): Secondary | ICD-10-CM

## 2024-06-03 DIAGNOSIS — F5104 Psychophysiologic insomnia: Secondary | ICD-10-CM

## 2024-06-03 DIAGNOSIS — I10 Essential (primary) hypertension: Secondary | ICD-10-CM

## 2024-06-13 ENCOUNTER — Ambulatory Visit: Payer: Self-pay | Admitting: Internal Medicine

## 2024-06-14 NOTE — Telephone Encounter (Signed)
 Shlomo Wilbert SAUNDERS, MD  P Cv Div Sleep Studies Patient has severe OSA but failed CPAP titration due to ongoing events.  Please set up for in lab BiPAP titration

## 2024-06-14 NOTE — Telephone Encounter (Signed)
 The patient has been notified of the result and verbalized understanding.  All questions (if any) were answered. Joshua Dalton Seip, CMA 06/14/2024 9:01 AM     PRECERT BIPAP TITRATION  Prior Authorization for Bipap titration sent to [facility] via web portal. Tracking Number . Prior authorization is NOT REQUIRED for all place of service settings

## 2024-06-19 ENCOUNTER — Other Ambulatory Visit: Payer: Self-pay | Admitting: Internal Medicine

## 2024-06-26 ENCOUNTER — Encounter: Payer: Self-pay | Admitting: *Deleted

## 2024-06-26 NOTE — Telephone Encounter (Signed)
 This encounter was created in error - please disregard.

## 2024-06-26 NOTE — Telephone Encounter (Signed)
 Prior Authorization for Bipap titration sent to [facility] via web portal. Tracking Number . Prior authorization is NOT REQUIRED for all place of service settings

## 2024-06-26 NOTE — Telephone Encounter (Deleted)
-----   Message from Wilbert Bihari sent at 05/29/2024 10:34 PM EDT ----- Patient has severe OSA but failed CPAP titration due to ongoing events.  Please set up for in lab BiPAP titration

## 2024-07-02 ENCOUNTER — Ambulatory Visit (HOSPITAL_BASED_OUTPATIENT_CLINIC_OR_DEPARTMENT_OTHER): Attending: Cardiology | Admitting: Cardiology

## 2024-07-02 DIAGNOSIS — I10 Essential (primary) hypertension: Secondary | ICD-10-CM | POA: Diagnosis present

## 2024-07-02 DIAGNOSIS — I493 Ventricular premature depolarization: Secondary | ICD-10-CM | POA: Diagnosis present

## 2024-07-02 DIAGNOSIS — G4733 Obstructive sleep apnea (adult) (pediatric): Secondary | ICD-10-CM | POA: Insufficient documentation

## 2024-07-02 DIAGNOSIS — F5104 Psychophysiologic insomnia: Secondary | ICD-10-CM | POA: Diagnosis not present

## 2024-07-02 DIAGNOSIS — I420 Dilated cardiomyopathy: Secondary | ICD-10-CM | POA: Diagnosis not present

## 2024-07-16 ENCOUNTER — Encounter: Payer: Self-pay | Admitting: Internal Medicine

## 2024-07-16 ENCOUNTER — Ambulatory Visit: Admitting: Internal Medicine

## 2024-07-16 VITALS — BP 108/70 | HR 81 | Temp 98.2°F | Ht 74.0 in | Wt 296.2 lb

## 2024-07-16 DIAGNOSIS — F5104 Psychophysiologic insomnia: Secondary | ICD-10-CM

## 2024-07-16 DIAGNOSIS — R2242 Localized swelling, mass and lump, left lower limb: Secondary | ICD-10-CM

## 2024-07-16 DIAGNOSIS — M25562 Pain in left knee: Secondary | ICD-10-CM

## 2024-07-16 DIAGNOSIS — M1A9XX0 Chronic gout, unspecified, without tophus (tophi): Secondary | ICD-10-CM

## 2024-07-16 LAB — URIC ACID: Uric Acid: 8.3 mg/dL (ref 3.8–8.4)

## 2024-07-16 NOTE — Assessment & Plan Note (Signed)
 Well-managed with trazodone , resulting in improved sleep and feeling rested. - Continue twrazodone for sleep management.

## 2024-07-16 NOTE — Progress Notes (Signed)
 I,Victoria T Emmitt, CMA,acting as a neurosurgeon for Catheryn LOISE Slocumb, MD.,have documented all relevant documentation on the behalf of Catheryn LOISE Slocumb, MD,as directed by  Catheryn LOISE Slocumb, MD while in the presence of Catheryn LOISE Slocumb, MD.  Subjective:  Patient ID: Raymond Welch , male    DOB: 11-24-1950 , 73 y.o.   MRN: [id number]  Chief Complaint  Patient presents with   Med Check    Patient presents today for Trazodone  follow up. He reports compliance with medications. Denies headache, chest pain & sob. He would like to discuss Ginseng. He wants to know if this is a good supplement to take.  He also complains of left knee pain. He wears a brace at home for support which helps sometimes. He is not currently est with an orthopedic.     HPI Discussed the use of AI scribe software for clinical note transcription with the patient, who gave verbal consent to proceed.  History of Present Illness Raymond Welch is a 73 year old male who presents with sleep issues and left knee pain.  His sleep has improved with the use of trazodone , and he feels rested. The specific dose and frequency of trazodone  are not mentioned.  He has been experiencing left knee pain for approximately one month. The knee is swollen and tight, particularly when bending or straightening it. There is no history of trauma, falls, or twisting injuries to the knee. The pain does not significantly affect his ability to walk but causes discomfort when bending the knee. He has not sought orthopedic evaluation for this issue yet, although he is established with [facility] for previous care.  He has a history of left hip replacement surgery in July 2023. His leg has been aching, particularly in the calf area. He denies any recent long car trips or significant physical activity. He is currently taking vitamin D , likely 2000 units, and his levels were noted to be adequate in September.  No calf pain or tenderness reported. No recent travel.   HPI    Past Medical History:  Diagnosis Date   Arthritis    Cardiomyopathy (HCC)    Diabetes mellitus without complication (HCC)    Hypertension    Low back pain    Myocardial infarction (HCC)    Osteoporosis    Pure hypercholesterolemia    Sleep apnea      Family History  Problem Relation Age of Onset   Diabetes Mother    Heart attack Mother 34       Died age 70   Aneurysm Father 65   Lung cancer Brother    Heart disease Son     Current Medications[1]   Allergies[2]   Review of Systems  Constitutional: Negative.   Respiratory: Negative.    Cardiovascular: Negative.   Gastrointestinal: Negative.   Musculoskeletal:  Positive for arthralgias.  Skin: Negative.   Allergic/Immunologic: Negative.   Hematological: Negative.      Today's Vitals   07/16/24 1158  BP: 108/70  Pulse: 81  Temp: 98.2 F (36.8 C)  SpO2: 98%  Weight: 296 lb 3.2 oz (134.4 kg)  Height: 6' 2 (1.88 m)   Body mass index is 38.03 kg/m.  Wt Readings from Last 3 Encounters:  07/16/24 296 lb 3.2 oz (134.4 kg)  07/02/24 295 lb (133.8 kg)  05/24/24 280 lb (127 kg)    The ASCVD Risk score (Arnett DK, et al., 2019) failed to calculate for the following reasons:   Risk score cannot  be calculated because patient has a medical history suggesting prior/existing ASCVD   * - Cholesterol units were assumed  Objective:  Physical Exam Vitals and nursing note reviewed.  Constitutional:      Appearance: Normal appearance.  HENT:     Head: Normocephalic and atraumatic.  Eyes:     Extraocular Movements: Extraocular movements intact.  Cardiovascular:     Rate and Rhythm: Normal rate and regular rhythm.     Heart sounds: Normal heart sounds.  Pulmonary:     Effort: Pulmonary effort is normal.     Breath sounds: Normal breath sounds.  Musculoskeletal:        General: Swelling and tenderness present.     Cervical back: Normal range of motion.     Comments: R calf 17 L calf 17.25 Posterior knee  tenderness Negative Homan's sign  Skin:    General: Skin is warm.  Neurological:     General: No focal deficit present.     Mental Status: He is alert.  Psychiatric:        Mood and Affect: Mood normal.         Assessment And Plan:   Assessment & Plan Chronic insomnia Well-managed with trazodone , resulting in improved sleep and feeling rested. - Continue twrazodone for sleep management. Acute pain of left knee Acute left knee pain with swelling and tightness for one month. Also c/o pulling sensation behind his left knee. Differential includes DVT and intrinsic orthopedic issue. fluid accumulation or orthopedic issues. Calf larger but not painful, reducing likelihood of deep vein thrombosis. - Referred to [facility] for evaluation and x-ray of left knee. - Ordered ultrasound of left calf to rule out deep vein thrombosis.  Localized swelling of left lower extremity Admits to decreased activity, no recent trips or long car rides.  - Refer for LLE venous u/s to r/o DVT.  - He is in agreement with treatment plan.  Chronic gout involving toe of left foot without tophus, unspecified cause He reports having recent gouty attack two weeks ago. He is not taking allopurinol. This is second occurrence in the past year.  - Check uric acid level.  - Consider starting allopurinol after review of labs.  - Avoid foods known to trigger sx    Orders Placed This Encounter  Procedures   Uric acid   VAS US  LOWER EXTREMITY VENOUS (DVT)     Return if symptoms worsen or fail to improve.  Patient was given opportunity to ask questions. Patient verbalized understanding of the plan and was able to repeat key elements of the plan. All questions were answered to their satisfaction.    I, Catheryn LOISE Slocumb, MD, have reviewed all documentation for this visit. The documentation on 07/16/2024 for the exam, diagnosis, procedures, and orders are all accurate and complete.   IF YOU HAVE BEEN REFERRED TO A  SPECIALIST, IT MAY TAKE 1-2 WEEKS TO SCHEDULE/PROCESS THE REFERRAL. IF YOU HAVE NOT HEARD FROM US /SPECIALIST IN TWO WEEKS, PLEASE GIVE US  A CALL AT [phone].      [1]  Current Outpatient Medications:    atorvastatin  (LIPITOR) 40 MG tablet, TAKE 1 TABLET BY MOUTH EVERY DAY, Disp: 90 tablet, Rfl: 3   losartan -hydrochlorothiazide  (HYZAAR) 100-12.5 MG tablet, TAKE 1 TABLET BY MOUTH DAILY, Disp: 100 tablet, Rfl: 2   metoprolol  succinate (TOPROL -XL) 50 MG 24 hr tablet, TAKE 1 TABLET BY MOUTH DAILY  WITH OR IMMEDIATELY FOLLOWING A  MEAL, Disp: 100 tablet, Rfl: 2  oxyCODONE  10 MG TABS, Take 1 tablet (10 mg total) by mouth every 4 (four) hours as needed for severe pain (pain score 7-10)., Disp: 42 tablet, Rfl: 0   spironolactone  (ALDACTONE ) 25 MG tablet, TAKE 1 TABLET (25 MG TOTAL) BY MOUTH DAILY., Disp: 90 tablet, Rfl: 3   tadalafil  (CIALIS ) 20 MG tablet, TAKE 1 TABLET BY MOUTH ONCE DAILY AS NEEDED FOR ERECTILE DYSFUNCTION, Disp: 30 tablet, Rfl: 1   traZODone  (DESYREL ) 50 MG tablet, TAKE 1 TABLET BY MOUTH EVERYDAY AT BEDTIME, Disp: 30 tablet, Rfl: 1   acetaminophen  (TYLENOL ) 500 MG tablet, Take 2 tablets (1,000 mg total) by mouth every 6 (six) hours. (Patient not taking: Reported on 07/16/2024), Disp: 30 tablet, Rfl: 0   methocarbamol  (ROBAXIN ) 500 MG tablet, Take 1 tablet (500 mg total) by mouth 2 (two) times daily. (Patient not taking: Reported on 07/16/2024), Disp: 8 tablet, Rfl: 0   polyethylene glycol (MIRALAX  / GLYCOLAX ) 17 g packet, Take 17 g by mouth daily as needed for mild constipation. (Patient not taking: Reported on 07/16/2024), Disp: 14 each, Rfl: 0 [2] No Known Allergies

## 2024-07-16 NOTE — Patient Instructions (Signed)
 Insomnia Insomnia is a sleep disorder that makes it difficult to fall asleep or stay asleep. Insomnia can cause fatigue, low energy, difficulty concentrating, mood swings, and poor performance at work or school. There are three different ways to classify insomnia: Difficulty falling asleep. Difficulty staying asleep. Waking up too early in the morning. Any type of insomnia can be long-term (chronic) or short-term (acute). Both are common. Short-term insomnia usually lasts for 3 months or less. Chronic insomnia occurs at least three times a week for longer than 3 months. What are the causes? Insomnia may be caused by another condition, situation, or substance, such as: Having certain mental health conditions, such as anxiety and depression. Using caffeine, alcohol , tobacco, or drugs. Having gastrointestinal conditions, such as gastroesophageal reflux disease (GERD). Having certain medical conditions. These include: Asthma. Alzheimer's disease. Stroke. Chronic pain. An overactive thyroid  gland (hyperthyroidism). Other sleep disorders, such as restless legs syndrome and sleep apnea. Menopause. Sometimes, the cause of insomnia may not be known. What increases the risk? Risk factors for insomnia include: Gender. Females are affected more often than males. Age. Insomnia is more common as people get older. Stress and certain medical and mental health conditions. Lack of exercise. Having an irregular work schedule. This may include working night shifts and traveling between different time zones. What are the signs or symptoms? If you have insomnia, the main symptom is having trouble falling asleep or having trouble staying asleep. This may lead to other symptoms, such as: Feeling tired or having low energy. Feeling nervous about going to sleep. Not feeling rested in the morning. Having trouble concentrating. Feeling irritable, anxious, or depressed. How is this diagnosed? This condition  may be diagnosed based on: Your symptoms and medical history. Your health care provider may ask about: Your sleep habits. Any medical conditions you have. Your mental health. A physical exam. How is this treated? Treatment for insomnia depends on the cause. Treatment may focus on treating an underlying condition that is causing the insomnia. Treatment may also include: Medicines to help you sleep. Counseling or therapy. Lifestyle adjustments to help you sleep better. Follow these instructions at home: Eating and drinking  Limit or avoid alcohol , caffeinated beverages, and products that contain nicotine and tobacco, especially close to bedtime. These can disrupt your sleep. Do not eat a large meal or eat spicy foods right before bedtime. This can lead to digestive discomfort that can make it hard for you to sleep. Sleep habits  Keep a sleep diary to help you and your health care provider figure out what could be causing your insomnia. Write down: When you sleep. When you wake up during the night. How well you sleep and how rested you feel the next day. Any side effects of medicines you are taking. What you eat and drink. Make your bedroom a dark, comfortable place where it is easy to fall asleep. Put up shades or blackout curtains to block light from outside. Use a white noise machine to block noise. Keep the temperature cool. Limit screen use before bedtime. This includes: Not watching TV. Not using your smartphone, tablet, or computer. Stick to a routine that includes going to bed and waking up at the same times every day and night. This can help you fall asleep faster. Consider making a quiet activity, such as reading, part of your nighttime routine. Try to avoid taking naps during the day so that you sleep better at night. Get out of bed if you are still awake after  15 minutes of trying to sleep. Keep the lights down, but try reading or doing a quiet activity. When you feel  sleepy, go back to bed. General instructions Take over-the-counter and prescription medicines only as told by your health care provider. Exercise regularly as told by your health care provider. However, avoid exercising in the hours right before bedtime. Use relaxation techniques to manage stress. Ask your health care provider to suggest some techniques that may work well for you. These may include: Breathing exercises. Routines to release muscle tension. Visualizing peaceful scenes. Make sure that you drive carefully. Do not drive if you feel very sleepy. Keep all follow-up visits. This is important. Contact a health care provider if: You are tired throughout the day. You have trouble in your daily routine due to sleepiness. You continue to have sleep problems, or your sleep problems get worse. Get help right away if: You have thoughts about hurting yourself or someone else. Get help right away if you feel like you may hurt yourself or others, or have thoughts about taking your own life. Go to your nearest emergency room or: Call 911. Call the National Suicide Prevention Lifeline at 2232757840 or 988. This is open 24 hours a day. Text the Crisis Text Line at 657-529-4371. Summary Insomnia is a sleep disorder that makes it difficult to fall asleep or stay asleep. Insomnia can be long-term (chronic) or short-term (acute). Treatment for insomnia depends on the cause. Treatment may focus on treating an underlying condition that is causing the insomnia. Keep a sleep diary to help you and your health care provider figure out what could be causing your insomnia. This information is not intended to replace advice given to you by your health care provider. Make sure you discuss any questions you have with your health care provider. Document Revised: 06/29/2021 Document Reviewed: 06/29/2021 Elsevier Patient Education  2024 [employer].

## 2024-07-18 ENCOUNTER — Ambulatory Visit: Payer: Self-pay | Admitting: Internal Medicine

## 2024-07-24 ENCOUNTER — Ambulatory Visit (HOSPITAL_COMMUNITY)
Admission: RE | Admit: 2024-07-24 | Discharge: 2024-07-24 | Disposition: A | Discharge status: Home / Self Care | Source: Ambulatory Visit | Attending: Internal Medicine | Admitting: Internal Medicine

## 2024-07-24 DIAGNOSIS — R2242 Localized swelling, mass and lump, left lower limb: Secondary | ICD-10-CM | POA: Diagnosis not present

## 2024-07-25 ENCOUNTER — Ambulatory Visit: Payer: Self-pay | Admitting: Internal Medicine

## 2024-07-30 NOTE — Procedures (Signed)
 " Indications for Polysomnography The patient is a 73 year old Male who is 6' 2 and weighs 295.0 lbs. His BMI equals 37.9.  A full night titration treatment study was performed.  No medications were reported taken during the night.No Data. Polysomnogram Data A full night polysomnogram recorded the standard physiologic parameters including EEG, EOG, EMG, EKG, nasal and oral airflow.  Respiratory parameters of chest and abdominal movements were recorded with Respiratory Inductance Plethysmography belts.   Oxygen saturation was recorded by pulse oximetry.  Sleep Architecture The total recording time of the polysomnogram was 403.4 minutes.  The total sleep time was 304.0 minutes.  The patient spent 26.8% of total sleep time in Stage N1, 55.1% in Stage N2, 0.0% in Stages N3, and 18.1% in REM.  Sleep latency was 1.9 minutes.   REM latency was 103.5 minutes.  Sleep Efficiency was 75.4%.  Wake after Sleep Onset time was 97.5 minutes.  Titration Summary The patient was titrated at pressures ranging from 8/4 cm/H20 up to 24/20 cm/H20.  The last pressure used in the study was 24/20 cm/H20.  Respiratory Events The polysomnogram revealed a presence of 33 obstructive, 18 central, and 4 mixed apneas resulting in an Apnea index of 10.9 events per hour.  There were 145 hypopneas (GreaterEqual to3% desaturation and/or arousal) resulting in an Apnea\Hypopnea Index  (AHI GreaterEqual to3% desaturation and/or arousal) of 39.5 events per hour.  There were 97 hypopneas (GreaterEqual to4% desaturation) resulting in an Apnea\Hypopnea Index (AHI GreaterEqual to4% desaturation) of 30.0 events per hour.  There were 11  Respiratory Effort Related Arousals resulting in a RERA index of 2.2 events per hour. The Respiratory Disturbance Index is 41.6 events per hour.  The snore index was 0 events per hour.  Mean oxygen saturation was 93.5%.  The lowest oxygen saturation during sleep was 81.0%.  Time spent LessEqual to88% oxygen  saturation was  minutes ().  Limb Activity There were 5 limb movements recorded.  Of this total, 5 were classified as PLMs.  Of the PLMs, 4 were associated with arousals.  The Limb Movement index was 1.0 per hour while the PLM index was 1.0 per hour.  Cardiac Summary The average pulse rate was 64.6 bpm.  The minimum pulse rate was 43.0 bpm while the maximum pulse rate was 87.0 bpm.  Cardiac rhythm was NSR with PVCs .  Diagnosis: Obstructive Sleep Apnea Successful BiPAP S/T Titration  Recommendations: 1. Recommend a trial of ResMed BiPAP S/T at 24/20cm H2O with backup rate of 16 breaths per min and large F&P Simplus mask. 2. Close follow-up is necessary to ensure success with CPAP or oral appliance therapy for maximum benefit. 3. A follow-up oximetry study on CPAP is recommended to assess the adequacy of therapy and determine the need for supplemental oxygen or the potential need for Bi-level therapy.  An arterial blood gas to determine the adequacy of baseline ventilation and  oxygenation should also be considered. 4. Healthy sleep recommendations include:  adequate nightly sleep (normal 7-9 hrs/night), avoidance of caffeine after noon and alcohol near bedtime, and maintaining a sleep environment that is cool, dark and quiet. 5. Weight loss for overweight patients is recommended.  Even modest amounts of weight loss can significantly improve the severity of sleep apnea. 6. Snoring recommendations include:  weight loss where appropriate, side sleeping, and avoidance of alcohol before bed. 7. Operation of motor vehicle should be avoided when sleepy.   This study was personally reviewed and electronically signed by: Dr. Wilbert Bihari Accredited  Board Certified in Sleep Medicine Date/Time: 07/30/2024 7:41AM "

## 2024-08-20 ENCOUNTER — Telehealth: Payer: Self-pay | Admitting: *Deleted

## 2024-08-20 NOTE — Telephone Encounter (Signed)
 The patient has been notified of the result and verbalized understanding.  All questions (if any) were answered. Raymond Welch, CMA 08/20/2024 3:35 PM    Upon patient request DME selection is [facility] Patient understands he will be contacted by [facility] to set up his cpap. Patient understands to call if [facility] does not contact him with new setup in a timely manner. Patient understands they will be called once confirmation has been received from [facility] that they have received their new machine to schedule 10 week follow up appointment.   [facility] notified of new cpap order  Please add to airview Patient was grateful for the call and thanked me

## 2024-08-20 NOTE — Telephone Encounter (Signed)
-----   Message from Wilbert Bihari, MD sent at 07/30/2024  7:42 AM EST ----- Please let patient know that they had a successful PAP titration and let DME know that orders are in EPIC.  Please set up 6 week OV with me.

## 2024-09-05 LAB — OPHTHALMOLOGY REPORT-SCANNED

## 2024-09-24 ENCOUNTER — Other Ambulatory Visit: Payer: Self-pay

## 2024-11-01 ENCOUNTER — Encounter: Payer: Self-pay | Admitting: Internal Medicine
